# Patient Record
Sex: Female | Born: 1950 | Race: White | Hispanic: No | Marital: Married | Smoking: Never smoker
Health system: Southern US, Community
[De-identification: ages and names within clinical notes are randomized; demographics above are authoritative.]

## PROBLEM LIST (undated history)

## (undated) DIAGNOSIS — I10 Essential (primary) hypertension: Secondary | ICD-10-CM

## (undated) DIAGNOSIS — F988 Other specified behavioral and emotional disorders with onset usually occurring in childhood and adolescence: Secondary | ICD-10-CM

## (undated) DIAGNOSIS — Z9889 Other specified postprocedural states: Secondary | ICD-10-CM

## (undated) DIAGNOSIS — R319 Hematuria, unspecified: Secondary | ICD-10-CM

## (undated) DIAGNOSIS — R112 Nausea with vomiting, unspecified: Secondary | ICD-10-CM

## (undated) DIAGNOSIS — C9110 Chronic lymphocytic leukemia of B-cell type not having achieved remission: Secondary | ICD-10-CM

## (undated) DIAGNOSIS — E785 Hyperlipidemia, unspecified: Secondary | ICD-10-CM

## (undated) HISTORY — PX: LAPAROSCOPY: SHX197

## (undated) HISTORY — DX: Hyperlipidemia, unspecified: E78.5

## (undated) HISTORY — DX: Other specified behavioral and emotional disorders with onset usually occurring in childhood and adolescence: F98.8

## (undated) HISTORY — DX: Chronic lymphocytic leukemia of B-cell type not having achieved remission: C91.10

## (undated) HISTORY — DX: Essential (primary) hypertension: I10

## (undated) HISTORY — DX: Hematuria, unspecified: R31.9

## (undated) HISTORY — PX: TOTAL HIP ARTHROPLASTY: SHX124

---

## 2000-12-09 ENCOUNTER — Other Ambulatory Visit: Admission: RE | Admit: 2000-12-09 | Discharge: 2000-12-09 | Payer: Self-pay | Admitting: Obstetrics and Gynecology

## 2002-01-16 ENCOUNTER — Other Ambulatory Visit: Admission: RE | Admit: 2002-01-16 | Discharge: 2002-01-16 | Payer: Self-pay | Admitting: Obstetrics and Gynecology

## 2002-01-23 ENCOUNTER — Encounter: Payer: Self-pay | Admitting: Obstetrics and Gynecology

## 2002-01-23 ENCOUNTER — Encounter: Admission: RE | Admit: 2002-01-23 | Discharge: 2002-01-23 | Payer: Self-pay | Admitting: Obstetrics and Gynecology

## 2002-01-30 ENCOUNTER — Encounter: Admission: RE | Admit: 2002-01-30 | Discharge: 2002-01-30 | Payer: Self-pay | Admitting: Obstetrics and Gynecology

## 2002-01-30 ENCOUNTER — Encounter: Payer: Self-pay | Admitting: Obstetrics and Gynecology

## 2003-01-25 ENCOUNTER — Other Ambulatory Visit: Admission: RE | Admit: 2003-01-25 | Discharge: 2003-01-25 | Payer: Self-pay | Admitting: Obstetrics and Gynecology

## 2003-01-30 ENCOUNTER — Encounter: Payer: Self-pay | Admitting: Obstetrics and Gynecology

## 2003-01-30 ENCOUNTER — Encounter: Admission: RE | Admit: 2003-01-30 | Discharge: 2003-01-30 | Payer: Self-pay | Admitting: Obstetrics and Gynecology

## 2003-10-17 ENCOUNTER — Encounter: Admission: RE | Admit: 2003-10-17 | Discharge: 2003-10-17 | Payer: Self-pay | Admitting: Obstetrics and Gynecology

## 2004-05-14 ENCOUNTER — Other Ambulatory Visit: Admission: RE | Admit: 2004-05-14 | Discharge: 2004-05-14 | Payer: Self-pay | Admitting: Obstetrics and Gynecology

## 2005-09-01 ENCOUNTER — Other Ambulatory Visit: Admission: RE | Admit: 2005-09-01 | Discharge: 2005-09-01 | Payer: Self-pay | Admitting: Obstetrics and Gynecology

## 2006-09-19 ENCOUNTER — Other Ambulatory Visit: Admission: RE | Admit: 2006-09-19 | Discharge: 2006-09-19 | Payer: Self-pay | Admitting: Obstetrics and Gynecology

## 2006-09-21 ENCOUNTER — Encounter: Admission: RE | Admit: 2006-09-21 | Discharge: 2006-09-21 | Payer: Self-pay | Admitting: Obstetrics and Gynecology

## 2006-10-05 ENCOUNTER — Ambulatory Visit: Payer: Self-pay | Admitting: Internal Medicine

## 2006-11-03 ENCOUNTER — Encounter (INDEPENDENT_AMBULATORY_CARE_PROVIDER_SITE_OTHER): Payer: Self-pay | Admitting: Specialist

## 2006-11-03 ENCOUNTER — Ambulatory Visit: Payer: Self-pay | Admitting: Internal Medicine

## 2008-12-03 ENCOUNTER — Other Ambulatory Visit: Admission: RE | Admit: 2008-12-03 | Discharge: 2008-12-03 | Payer: Self-pay | Admitting: Obstetrics and Gynecology

## 2009-12-30 ENCOUNTER — Encounter: Admission: RE | Admit: 2009-12-30 | Discharge: 2009-12-30 | Payer: Self-pay | Admitting: Obstetrics and Gynecology

## 2010-05-27 ENCOUNTER — Ambulatory Visit: Payer: Self-pay | Admitting: Cardiology

## 2011-06-02 ENCOUNTER — Other Ambulatory Visit: Payer: Self-pay | Admitting: *Deleted

## 2011-06-02 NOTE — Telephone Encounter (Signed)
escribe medication per fax request  

## 2011-08-16 ENCOUNTER — Other Ambulatory Visit: Payer: Self-pay | Admitting: Cardiology

## 2011-08-16 MED ORDER — TELMISARTAN-HCTZ 80-25 MG PO TABS: 1.0000 | ORAL_TABLET | Freq: Every day | ORAL | Status: DC

## 2011-08-16 NOTE — Telephone Encounter (Signed)
Pt called wanting refill of mycardis hct  Pt was previous dr Deborah Chalk pt and made appt to see dr [place] on Dec 5 She is going out of town tomorrow and only has 6 pills left. Please call to [facility] on [address]

## 2011-08-16 NOTE — Telephone Encounter (Signed)
Called requesting med refill on Micardis HCT 80/25. Sent to [facility].

## 2011-09-08 ENCOUNTER — Encounter: Payer: Self-pay | Admitting: Cardiology

## 2011-09-08 ENCOUNTER — Ambulatory Visit (INDEPENDENT_AMBULATORY_CARE_PROVIDER_SITE_OTHER): Payer: 59 | Admitting: Cardiology

## 2011-09-08 VITALS — BP 120/78 | HR 84 | Ht 63.0 in | Wt 155.8 lb

## 2011-09-08 DIAGNOSIS — E559 Vitamin D deficiency, unspecified: Secondary | ICD-10-CM

## 2011-09-08 DIAGNOSIS — I10 Essential (primary) hypertension: Secondary | ICD-10-CM

## 2011-09-08 DIAGNOSIS — E785 Hyperlipidemia, unspecified: Secondary | ICD-10-CM

## 2011-09-08 MED ORDER — AMLODIPINE BESYLATE 5 MG PO TABS: 5.0000 mg | ORAL_TABLET | Freq: Every day | ORAL | Status: DC

## 2011-09-08 MED ORDER — TELMISARTAN-HCTZ 80-25 MG PO TABS: 1.0000 | ORAL_TABLET | Freq: Every day | ORAL | Status: DC

## 2011-09-08 MED ORDER — ATORVASTATIN CALCIUM 20 MG PO TABS: 10.0000 mg | ORAL_TABLET | Freq: Every day | ORAL | Status: DC

## 2011-09-08 NOTE — Assessment & Plan Note (Signed)
She is on chronic therapy with Lipitor 10 mg daily. We will schedule her back for fasting lipid levels. I will plan a followup again in one year.

## 2011-09-08 NOTE — Progress Notes (Signed)
   Alice Pollard Date of Birth: Jan 25, 1951 Medical Record #[id number]  History of Present Illness: Alice Pollard is seen today to followup on her hypertension and dyslipidemia. She has been followed by Dr. Reyes Ivan and Dr. Deborah Chalk in the past for these issues. She has no history of cardiac disease. Her health status is good. She denies any chest pain, shortness of breath, or palpitations. She is tolerating her medications well. She reports that she stays active playing golf and tennis. Her husband is an Pensions consultant.  Current Outpatient Prescriptions on File Prior to Visit  Medication Sig Dispense Refill  . amLODipine (NORVASC) 5 MG tablet Take 1 tablet (5 mg total) by mouth daily.  90 tablet  3  . atorvastatin (LIPITOR) 20 MG tablet Take 0.5 tablets (10 mg total) by mouth daily.  45 tablet  3  . CALCIUM PO Take by mouth daily.        . Estradiol (ESTROGEL TD) Place onto the skin 2 (two) times a week.        Marland Kitchen FLUoxetine (PROZAC) 20 MG tablet Take 20 mg by mouth daily.        . progesterone (PROMETRIUM) 100 MG capsule Take 100 mg by mouth daily.        Marland Kitchen DISCONTD: telmisartan-hydrochlorothiazide (MICARDIS HCT) 80-25 MG per tablet Take 1 tablet by mouth daily.  30 tablet  0    Allergies  Allergen Reactions  . Codeine     Past Medical History  Diagnosis Date  . HTN (hypertension)   . Dyslipidemia   . Endometriosis     Past Surgical History  Procedure Date  . Exploratory laparotomy     endometriosis    History  Smoking status  . Never Smoker   Smokeless tobacco  . Not on file    History  Alcohol Use: Not on file    Family History  Problem Relation Age of Onset  . Heart failure Mother     Review of Systems: As noted in history of present illness.  All other systems were reviewed and are negative.  Physical Exam: BP 120/78  Pulse 84  Ht 5\' 3"  (1.6 m)  Wt 70.67 kg (155 lb 12.8 oz)  BMI 27.60 kg/m2 The patient is alert and oriented x 3.  The mood and affect are normal.   The skin is warm and dry.  Color is normal.  The HEENT exam reveals that the sclera are nonicteric.  The mucous membranes are moist.  The carotids are 2+ without bruits.  There is no thyromegaly.  There is no JVD.  The lungs are clear.  The chest wall is non tender.  The heart exam reveals a regular rate with a normal S1 and S2.  There are no murmurs, gallops, or rubs.  The PMI is not displaced.   Abdominal exam reveals good bowel sounds.  There is no guarding or rebound.  There is no hepatosplenomegaly or tenderness.  There are no masses.  Exam of the legs reveal no clubbing, cyanosis, or edema.  The legs are without rashes.  The distal pulses are intact.  Cranial nerves II - XII are intact.  Motor and sensory functions are intact.  The gait is normal.  LABORATORY DATA: ECG today is normal.  Assessment / Plan:

## 2011-09-08 NOTE — Patient Instructions (Signed)
You need to get regular aerobic exercise.  Keep your weight down.  Continue your current medications.  We will schedule you for fasting blood work.  I will see you again in 1 year.

## 2011-09-08 NOTE — Assessment & Plan Note (Signed)
Blood pressure is in excellent control now. We will continue on her current medical therapy. We will schedule her for followup lab work to check her electrolytes and renal function. She reports a history of low vitamin D levels and we will check this as well.

## 2011-09-15 ENCOUNTER — Other Ambulatory Visit: Payer: 59 | Admitting: *Deleted

## 2011-10-12 ENCOUNTER — Other Ambulatory Visit: Payer: 59 | Admitting: *Deleted

## 2011-11-16 ENCOUNTER — Encounter: Payer: Self-pay | Admitting: Internal Medicine

## 2012-07-10 ENCOUNTER — Encounter: Payer: Self-pay | Admitting: Internal Medicine

## 2012-08-04 ENCOUNTER — Other Ambulatory Visit: Payer: Self-pay | Admitting: Cardiology

## 2012-08-04 MED ORDER — ATORVASTATIN CALCIUM 20 MG PO TABS: 10.0000 mg | ORAL_TABLET | Freq: Every day | ORAL | Status: DC

## 2012-08-04 MED ORDER — AMLODIPINE BESYLATE 5 MG PO TABS: 5.0000 mg | ORAL_TABLET | Freq: Every day | ORAL | Status: DC

## 2012-08-04 MED ORDER — TELMISARTAN-HCTZ 80-25 MG PO TABS: 1.0000 | ORAL_TABLET | Freq: Every day | ORAL | Status: DC

## 2012-08-10 ENCOUNTER — Encounter: Payer: Self-pay | Admitting: *Deleted

## 2012-10-17 ENCOUNTER — Encounter: Payer: Self-pay | Admitting: Cardiology

## 2012-10-17 ENCOUNTER — Ambulatory Visit (INDEPENDENT_AMBULATORY_CARE_PROVIDER_SITE_OTHER): Payer: 59 | Admitting: Cardiology

## 2012-10-17 VITALS — BP 126/84 | HR 82 | Ht 63.0 in | Wt 158.4 lb

## 2012-10-17 DIAGNOSIS — E785 Hyperlipidemia, unspecified: Secondary | ICD-10-CM

## 2012-10-17 DIAGNOSIS — I10 Essential (primary) hypertension: Secondary | ICD-10-CM

## 2012-10-17 MED ORDER — TELMISARTAN-HCTZ 80-25 MG PO TABS: 1.0000 | ORAL_TABLET | Freq: Every day | ORAL | Status: DC

## 2012-10-17 MED ORDER — AMLODIPINE BESYLATE 5 MG PO TABS: 5.0000 mg | ORAL_TABLET | Freq: Every day | ORAL | Status: DC

## 2012-10-17 NOTE — Patient Instructions (Addendum)
Resume lipitor 10 mg daily  Continue your other medications  I will schedule you for lab work in 3 months.  I will see you back in 1 year.

## 2012-10-17 NOTE — Progress Notes (Signed)
   Rudene Re Date of Birth: 16-Jul-1951 Medical Record #[id number]  History of Present Illness: Alice Pollard is seen today to followup on her hypertension and dyslipidemia.  She has no history of cardiac disease. Her health status is good. She denies any chest pain, shortness of breath, or palpitations. She admits that she has not been taking her lipitor regularly.  Current Outpatient Prescriptions on File Prior to Visit  Medication Sig Dispense Refill  . amLODipine (NORVASC) 5 MG tablet Take 1 tablet (5 mg total) by mouth daily.  90 tablet  3  . atorvastatin (LIPITOR) 20 MG tablet Take 0.5 tablets (10 mg total) by mouth daily.  45 tablet  1  . CALCIUM PO Take by mouth daily.        . Cetirizine HCl (ZYRTEC PO) Take by mouth.      . Estradiol (ESTROGEL TD) Place onto the skin 2 (two) times a week.        Marland Kitchen FLUoxetine (PROZAC) 20 MG tablet Take 20 mg by mouth daily.        . progesterone (PROMETRIUM) 100 MG capsule Take 100 mg by mouth daily.        Marland Kitchen telmisartan-hydrochlorothiazide (MICARDIS HCT) 80-25 MG per tablet Take 1 tablet by mouth daily.  90 tablet  3    Allergies  Allergen Reactions  . Codeine     Past Medical History  Diagnosis Date  . HTN (hypertension)   . Dyslipidemia   . Endometriosis     Past Surgical History  Procedure Date  . Exploratory laparotomy     endometriosis    History  Smoking status  . Never Smoker   Smokeless tobacco  . Not on file    History  Alcohol Use: Not on file    Family History  Problem Relation Age of Onset  . Heart failure Mother     Review of Systems: As noted in history of present illness.  All other systems were reviewed and are negative.  Physical Exam: BP 126/84  Pulse 82  Ht 5\' 3"  (1.6 m)  Wt 158 lb 6.4 oz (71.85 kg)  BMI 28.06 kg/m2  SpO2 99% The patient is alert and oriented x 3.  The mood and affect are normal.  The skin is warm and dry. The HEENT exam is normal.The carotids are 2+ without bruits.  There  is no thyromegaly.  There is no JVD.  The lungs are clear.    The heart exam reveals a regular rate with a normal S1 and S2.  There are no murmurs, gallops, or rubs.  The PMI is not displaced.   Abdominal exam reveals good bowel sounds.   There is no hepatosplenomegaly or tenderness.  There are no masses.  Exam of the legs reveal no clubbing, cyanosis, or edema.    The distal pulses are intact.  Cranial nerves II - XII are intact.  Motor and sensory functions are intact.  The gait is normal.  LABORATORY DATA:   Assessment / Plan: 1. HTN- BP is well controlled on Micardis HCT and amlodipine. These are refilled today. Follow up in one year.  2. Dyslipidemia resume lipitor 10 mg daily. Will get fasting lab work (chemistries and lipid panel) in 3 months.

## 2013-01-15 ENCOUNTER — Other Ambulatory Visit: Payer: Self-pay

## 2013-01-15 ENCOUNTER — Other Ambulatory Visit (INDEPENDENT_AMBULATORY_CARE_PROVIDER_SITE_OTHER): Payer: 59

## 2013-01-15 DIAGNOSIS — I10 Essential (primary) hypertension: Secondary | ICD-10-CM

## 2013-01-15 DIAGNOSIS — E785 Hyperlipidemia, unspecified: Secondary | ICD-10-CM

## 2013-01-15 LAB — HEPATIC FUNCTION PANEL
ALT: 16 U/L (ref 0–35)
AST: 20 U/L (ref 0–37)
Albumin: 4.4 g/dL (ref 3.5–5.2)
Alkaline Phosphatase: 67 U/L (ref 39–117)
Bilirubin, Direct: 0.1 mg/dL (ref 0.0–0.3)
Total Bilirubin: 0.6 mg/dL (ref 0.3–1.2)
Total Protein: 7.2 g/dL (ref 6.0–8.3)

## 2013-01-15 LAB — BASIC METABOLIC PANEL
BUN: 15 mg/dL (ref 6–23)
CO2: 31 mEq/L (ref 19–32)
Calcium: 9.1 mg/dL (ref 8.4–10.5)
Chloride: 100 mEq/L (ref 96–112)
Creatinine, Ser: 0.7 mg/dL (ref 0.4–1.2)
GFR: 90.19 mL/min (ref >60.00)
Glucose, Bld: 91 mg/dL (ref 70–99)
Potassium: 3.3 mEq/L — ABNORMAL LOW (ref 3.5–5.1)
Sodium: 139 mEq/L (ref 135–145)

## 2013-01-15 LAB — LDL CHOLESTEROL, DIRECT: Direct LDL: 121.6 mg/dL

## 2013-01-15 LAB — LIPID PANEL
Cholesterol: 214 mg/dL — ABNORMAL HIGH (ref 0–200)
HDL: 68.1 mg/dL (ref >39.00)
Total CHOL/HDL Ratio: 3
Triglycerides: 129 mg/dL (ref 0.0–149.0)
VLDL: 25.8 mg/dL (ref 0.0–40.0)

## 2013-01-15 MED ORDER — POTASSIUM CHLORIDE CRYS ER 20 MEQ PO TBCR: 20.0000 meq | EXTENDED_RELEASE_TABLET | Freq: Every day | ORAL | Status: DC

## 2013-01-17 ENCOUNTER — Other Ambulatory Visit: Payer: Self-pay

## 2013-01-17 DIAGNOSIS — E785 Hyperlipidemia, unspecified: Secondary | ICD-10-CM

## 2013-01-17 MED ORDER — ATORVASTATIN CALCIUM 20 MG PO TABS: 20.0000 mg | ORAL_TABLET | Freq: Every day | ORAL | Status: DC

## 2013-01-29 ENCOUNTER — Other Ambulatory Visit (INDEPENDENT_AMBULATORY_CARE_PROVIDER_SITE_OTHER): Payer: 59

## 2013-01-29 DIAGNOSIS — I10 Essential (primary) hypertension: Secondary | ICD-10-CM

## 2013-01-29 LAB — BASIC METABOLIC PANEL
BUN: 18 mg/dL (ref 6–23)
CO2: 31 mEq/L (ref 19–32)
Calcium: 9.3 mg/dL (ref 8.4–10.5)
Chloride: 100 mEq/L (ref 96–112)
Creatinine, Ser: 0.7 mg/dL (ref 0.4–1.2)
GFR: 88.71 mL/min (ref >60.00)
Glucose, Bld: 99 mg/dL (ref 70–99)
Potassium: 3.6 mEq/L (ref 3.5–5.1)
Sodium: 138 mEq/L (ref 135–145)

## 2013-01-31 ENCOUNTER — Telehealth: Payer: Self-pay | Admitting: *Deleted

## 2013-01-31 NOTE — Progress Notes (Signed)
Quick Note:  Please report to patient. The recent labs are stable. Continue same medication and careful diet. ______ 

## 2013-01-31 NOTE — Telephone Encounter (Signed)
Message copied by Burnell Blanks on Wed Jan 31, 2013  2:22 PM ------      Message from: Cassell Clement      Created: Wed Jan 31, 2013 11:15 AM       Please report to patient.  The recent labs are stable. Continue same medication and careful diet. ------

## 2013-01-31 NOTE — Telephone Encounter (Signed)
Advised patient of lab results  

## 2013-03-02 ENCOUNTER — Other Ambulatory Visit: Payer: Self-pay | Admitting: *Deleted

## 2013-03-02 MED ORDER — PROGESTERONE MICRONIZED 100 MG PO CAPS: 100.0000 mg | ORAL_CAPSULE | Freq: Every day | ORAL | Status: DC

## 2013-03-02 NOTE — Telephone Encounter (Signed)
Yes

## 2013-03-02 NOTE — Telephone Encounter (Signed)
Has annual scheduled for 03/26/13  Ok to give enough to last until then?

## 2013-03-19 ENCOUNTER — Other Ambulatory Visit: Payer: Self-pay

## 2013-03-19 ENCOUNTER — Other Ambulatory Visit: Payer: 59

## 2013-03-19 DIAGNOSIS — Z1231 Encounter for screening mammogram for malignant neoplasm of breast: Secondary | ICD-10-CM

## 2013-03-23 ENCOUNTER — Encounter: Payer: Self-pay | Admitting: Obstetrics & Gynecology

## 2013-03-26 ENCOUNTER — Ambulatory Visit (INDEPENDENT_AMBULATORY_CARE_PROVIDER_SITE_OTHER): Payer: 59 | Admitting: Obstetrics & Gynecology

## 2013-03-26 ENCOUNTER — Encounter: Payer: Self-pay | Admitting: Obstetrics & Gynecology

## 2013-03-26 VITALS — BP 118/76 | HR 60 | Ht 62.5 in | Wt 158.0 lb

## 2013-03-26 DIAGNOSIS — Z0000 Encounter for general adult medical examination without abnormal findings: Secondary | ICD-10-CM

## 2013-03-26 DIAGNOSIS — Z01419 Encounter for gynecological examination (general) (routine) without abnormal findings: Secondary | ICD-10-CM

## 2013-03-26 LAB — POCT URINALYSIS DIPSTICK
Bilirubin, UA: NEGATIVE
Glucose, UA: NEGATIVE
Ketones, UA: NEGATIVE
Leukocytes, UA: NEGATIVE
Nitrite, UA: NEGATIVE
Protein, UA: NEGATIVE
Urobilinogen, UA: NEGATIVE
pH, UA: 7

## 2013-03-26 LAB — BASIC METABOLIC PANEL
BUN: 17 mg/dL (ref 6–23)
CO2: 29 mEq/L (ref 19–32)
Calcium: 10.5 mg/dL (ref 8.4–10.5)
Chloride: 100 mEq/L (ref 96–112)
Creat: 0.66 mg/dL (ref 0.50–1.10)
Glucose, Bld: 99 mg/dL (ref 70–99)
Potassium: 3.7 mEq/L (ref 3.5–5.3)
Sodium: 140 mEq/L (ref 135–145)

## 2013-03-26 MED ORDER — PROGESTERONE MICRONIZED 100 MG PO CAPS: 100.0000 mg | ORAL_CAPSULE | Freq: Every day | ORAL | Status: DC

## 2013-03-26 MED ORDER — ESTRADIOL 0.0375 MG/24HR TD PTTW: 1.0000 | MEDICATED_PATCH | TRANSDERMAL | Status: DC

## 2013-03-26 MED ORDER — FLUOXETINE HCL 20 MG PO TABS: 10.0000 mg | ORAL_TABLET | Freq: Every day | ORAL | Status: DC

## 2013-03-26 NOTE — Progress Notes (Signed)
Patient ID: Alice Pollard, female   DOB: Jul 05, 1951, 62 y.o.   MRN: [id number]  62 y.o. G3P3 MarriedCaucasianF here for annual exam.  No vaginal bleeding.  Family doing well.  Oldest daughter has two daughters.     Patient's last menstrual period was 08/05/2007.          Sexually active: yes  The current method of family planning is post menopausal status.    Exercising: yes  tennis, golf Smoker:  no  Health Maintenance: Pap:  03/02/12--ASCUS with HR HPV History of abnormal Pap:  no MMG:  12/30/09 Colonoscopy:  1/08 polyps, diverticular disease BMD:   never TDaP:  12/03/08 done here Screening Labs: 4/14--potassium was mildly low.  Was on potassium supplementation and now off, Hb today: not done  Urine today: trace blood   reports that she has never smoked. She has never used smokeless tobacco. She reports that  drinks alcohol. She reports that she does not use illicit drugs.  Past Medical History  Diagnosis Date  . HTN (hypertension)   . Dyslipidemia   . Endometriosis   . ADD (attention deficit disorder)   . Hematuria     h/o asymptomatic    Past Surgical History  Procedure Laterality Date  . Laparoscopy      endometriosis    Current Outpatient Prescriptions  Medication Sig Dispense Refill  . amLODipine (NORVASC) 5 MG tablet Take 1 tablet (5 mg total) by mouth daily.  90 tablet  3  . atorvastatin (LIPITOR) 20 MG tablet Take 1 tablet (20 mg total) by mouth daily.  30 tablet  6  . CALCIUM PO Take by mouth daily.        . Cetirizine HCl (ZYRTEC PO) Take by mouth.      . cholecalciferol (VITAMIN D) 1000 UNITS tablet Take 1,000 Units by mouth daily.      Marland Kitchen estradiol (VIVELLE-DOT) 0.05 MG/24HR Place 1 patch onto the skin 2 (two) times a week.      Marland Kitchen FLUoxetine (PROZAC) 20 MG tablet Take 20 mg by mouth daily.        . montelukast (SINGULAIR) 10 MG tablet Take 10 mg by mouth as needed.      . progesterone (PROMETRIUM) 100 MG capsule Take 1 capsule (100 mg total) by mouth daily.  30  capsule  0  . telmisartan-hydrochlorothiazide (MICARDIS HCT) 80-25 MG per tablet Take 1 tablet by mouth daily.  90 tablet  3  . potassium chloride SA (K-DUR,KLOR-CON) 20 MEQ tablet Take 1 tablet (20 mEq total) by mouth daily.  30 tablet  6   No current facility-administered medications for this visit.    Family History  Problem Relation Age of Onset  . Heart failure Mother   . Other Brother     bilateral bladder  . Diabetes Father     ROS:  Pertinent items are noted in HPI.  Otherwise, a comprehensive ROS was negative.  Exam:   BP 118/76  Pulse 60  Ht 5' 2.5" (1.588 m)  Wt 158 lb (71.668 kg)  BMI 28.42 kg/m2  LMP 08/05/2007  Weight change: +3lbs  Height: 5' 2.5" (158.8 cm)  Ht Readings from Last 3 Encounters:  03/26/13 5' 2.5" (1.588 m)  10/17/12 5\' 3"  (1.6 m)  09/08/11 5\' 3"  (1.6 m)    General appearance: alert, cooperative and appears stated age Head: Normocephalic, without obvious abnormality, atraumatic Neck: no adenopathy, supple, symmetrical, trachea midline and thyroid normal to inspection and palpation Lungs: clear to  auscultation bilaterally Breasts: normal appearance, no masses or tenderness Heart: regular rate and rhythm Abdomen: soft, non-tender; bowel sounds normal; no masses,  no organomegaly Extremities: extremities normal, atraumatic, no cyanosis or edema Skin: Skin color, texture, turgor normal. No rashes or lesions Lymph nodes: Cervical, supraclavicular, and axillary nodes normal. No abnormal inguinal nodes palpated Neurologic: Grossly normal   Pelvic: External genitalia:  no lesions              Urethra:  normal appearing urethra with no masses, tenderness or lesions              Bartholins and Skenes: normal                 Vagina: normal appearing vagina with normal color and discharge, no lesions              Cervix: no lesions              Pap taken: no Bimanual Exam:  Uterus:  normal size, contour, position, consistency, mobility, non-tender               Adnexa: normal adnexa and no mass, fullness, tenderness               Rectovaginal: Confirms               Anus:  normal sphincter tone, no lesions  A:  Well Woman with normal exam  P:   Mammogram scheduled July 17.  Will try to schedule BMD for same day Knows colonoscopy is due Decrease prozac to 10mg  qd Decrease Minivelle to 0.0372mb twice weekly.  Continue Prometrium 100mg  qd.  Advised to take qhs pap smear with neg HR HPV 5/13 return annually or prn  An After Visit Summary was printed and given to the patient.

## 2013-03-26 NOTE — Patient Instructions (Signed)

## 2013-03-27 ENCOUNTER — Telehealth: Payer: Self-pay

## 2013-03-27 NOTE — Telephone Encounter (Signed)
6/24 lmtcb//kn 

## 2013-03-28 ENCOUNTER — Other Ambulatory Visit: Payer: Self-pay | Admitting: Obstetrics & Gynecology

## 2013-03-28 DIAGNOSIS — Z780 Asymptomatic menopausal state: Secondary | ICD-10-CM

## 2013-04-05 NOTE — Telephone Encounter (Signed)
7/3 lmtcb//kn

## 2013-04-05 NOTE — Telephone Encounter (Signed)
Patient returning Kelly's call. °

## 2013-04-19 ENCOUNTER — Ambulatory Visit
Admission: RE | Admit: 2013-04-19 | Discharge: 2013-04-19 | Disposition: A | Discharge status: Home / Self Care | Payer: 59 | Source: Ambulatory Visit | Attending: Obstetrics & Gynecology | Admitting: Obstetrics & Gynecology

## 2013-04-19 ENCOUNTER — Ambulatory Visit: Payer: 59

## 2013-04-19 ENCOUNTER — Ambulatory Visit
Admission: RE | Admit: 2013-04-19 | Discharge: 2013-04-19 | Disposition: A | Discharge status: Home / Self Care | Payer: 59 | Source: Ambulatory Visit

## 2013-04-19 DIAGNOSIS — Z1231 Encounter for screening mammogram for malignant neoplasm of breast: Secondary | ICD-10-CM

## 2013-04-19 DIAGNOSIS — Z780 Asymptomatic menopausal state: Secondary | ICD-10-CM

## 2013-04-22 ENCOUNTER — Encounter: Payer: Self-pay | Admitting: Obstetrics & Gynecology

## 2013-04-23 MED ORDER — FLUOXETINE HCL 20 MG PO TABS: 20.0000 mg | ORAL_TABLET | Freq: Every day | ORAL | Status: DC

## 2013-04-23 MED ORDER — ESTRADIOL 0.05 MG/24HR TD PTTW: 1.0000 | MEDICATED_PATCH | TRANSDERMAL | Status: DC

## 2013-04-23 NOTE — Telephone Encounter (Signed)
Inform rx done to pharmacy.

## 2013-04-23 NOTE — Telephone Encounter (Signed)
Patient notified

## 2013-04-23 NOTE — Telephone Encounter (Signed)
Patient notified of all results. States she has sent [place] a message through [place] stating tried to decrease estrogen patch but had more hot flashes and itching and tried to decrease prozac but was having some negative side effects. Would like to go back to her original doses, but will need new rx for both. Please advise.

## 2013-08-09 ENCOUNTER — Other Ambulatory Visit: Payer: Self-pay

## 2013-10-05 ENCOUNTER — Other Ambulatory Visit: Payer: Self-pay | Admitting: *Deleted

## 2013-10-05 MED ORDER — AMLODIPINE BESYLATE 5 MG PO TABS: 5.0000 mg | ORAL_TABLET | Freq: Every day | ORAL | Status: DC

## 2013-10-05 MED ORDER — TELMISARTAN-HCTZ 80-25 MG PO TABS: 1.0000 | ORAL_TABLET | Freq: Every day | ORAL | Status: DC

## 2013-11-28 ENCOUNTER — Encounter: Payer: Self-pay | Admitting: Cardiology

## 2013-11-28 ENCOUNTER — Ambulatory Visit (INDEPENDENT_AMBULATORY_CARE_PROVIDER_SITE_OTHER): Payer: 59 | Admitting: Cardiology

## 2013-11-28 VITALS — BP 132/80 | HR 73 | Ht 62.5 in | Wt 162.0 lb

## 2013-11-28 DIAGNOSIS — I10 Essential (primary) hypertension: Secondary | ICD-10-CM

## 2013-11-28 DIAGNOSIS — E785 Hyperlipidemia, unspecified: Secondary | ICD-10-CM

## 2013-11-28 MED ORDER — TELMISARTAN-HCTZ 80-25 MG PO TABS: 1.0000 | ORAL_TABLET | Freq: Every day | ORAL | Status: DC

## 2013-11-28 MED ORDER — AMLODIPINE BESYLATE 5 MG PO TABS: 5.0000 mg | ORAL_TABLET | Freq: Every day | ORAL | Status: DC

## 2013-11-28 NOTE — Patient Instructions (Signed)
We will check your fasting lab work  Continue your blood pressure medication.

## 2013-11-28 NOTE — Progress Notes (Signed)
Alice Pollard Date of Birth: 07/27/1951 Medical Record #[id number]  History of Present Illness: Alice Pollard is seen today to followup on her hypertension and dyslipidemia.  She has no history of cardiac disease. Her health status is good. She denies any chest pain, shortness of breath, or palpitations. She quit taking lipitor because of some tingling in her fingers and ? Mild muscle weakness. She does exercise and eats a healthy diet. There is a family history of CHF in both parents.  Current Outpatient Prescriptions on File Prior to Visit  Medication Sig Dispense Refill  . CALCIUM PO Take by mouth daily.        . Cetirizine HCl (ZYRTEC PO) Take by mouth.      . cholecalciferol (VITAMIN D) 1000 UNITS tablet Take 1,000 Units by mouth daily.      Marland Kitchen estradiol (MINIVELLE) 0.05 MG/24HR Place 1 patch (0.05 mg total) onto the skin 2 (two) times a week.  24 patch  4  . FLUoxetine (PROZAC) 20 MG tablet Take 1 tablet (20 mg total) by mouth daily.  90 tablet  4  . montelukast (SINGULAIR) 10 MG tablet Take 10 mg by mouth as needed.      . progesterone (PROMETRIUM) 100 MG capsule Take 1 capsule (100 mg total) by mouth daily.  90 capsule  4   No current facility-administered medications on file prior to visit.    Allergies  Allergen Reactions  . Codeine   . Latex     sensitivity    Past Medical History  Diagnosis Date  . HTN (hypertension)   . Dyslipidemia   . Endometriosis   . ADD (attention deficit disorder)   . Hematuria     h/o asymptomatic    Past Surgical History  Procedure Laterality Date  . Laparoscopy      endometriosis    History  Smoking status  . Never Smoker   Smokeless tobacco  . Never Used    History  Alcohol Use  . Yes    Comment: occ    Family History  Problem Relation Age of Onset  . Heart failure Mother   . Other Brother     bilateral bladder  . Diabetes Father     Review of Systems: As noted in history of present illness.  All other systems  were reviewed and are negative.  Physical Exam: BP 132/80  Pulse 73  Ht 5' 2.5" (1.588 m)  Wt 162 lb (73.483 kg)  BMI 29.14 kg/m2 She is a WDWF in NAD. The HEENT exam is normal.The carotids are 2+ without bruits.  There is no thyromegaly.  There is no JVD.  The lungs are clear.    The heart exam reveals a regular rate with a normal S1 and S2.  There are no murmurs, gallops, or rubs.  The PMI is not displaced.   Abdominal exam reveals good bowel sounds.   There is no hepatosplenomegaly or tenderness.  There are no masses.  Exam of the legs reveal no clubbing, cyanosis, or edema.    The distal pulses are intact.  Cranial nerves II - XII are intact.  Motor and sensory functions are intact.  The gait is normal.  LABORATORY DATA: Ecg: NSR rate 73 bpm. Normal.  Assessment / Plan: 1. HTN- BP is well controlled on Micardis HCT and amlodipine. These are refilled today. Follow up in one year.  2. Dyslipidemia - based on prior levels 10 year CV risk is about 5.5%. Will check  fasting lab work and Vit D level. May want to try moderate dose statin depending on results.

## 2013-12-11 ENCOUNTER — Other Ambulatory Visit: Payer: 59

## 2014-04-23 ENCOUNTER — Other Ambulatory Visit: Payer: Self-pay | Admitting: Obstetrics & Gynecology

## 2014-04-24 NOTE — Telephone Encounter (Signed)
Last AEX; 03/26/13 Last refill: Prozac 04/23/13 #90, 4 ref   Progesterone 03/26/13 #90, 4 ref Last MMG: 04/23/13 Current AEX: 05/21/14  Please advise

## 2014-05-21 ENCOUNTER — Ambulatory Visit: Payer: 59 | Admitting: Obstetrics & Gynecology

## 2014-05-23 ENCOUNTER — Ambulatory Visit: Payer: 59 | Admitting: Obstetrics & Gynecology

## 2014-05-31 ENCOUNTER — Encounter: Payer: Self-pay | Admitting: Obstetrics & Gynecology

## 2014-05-31 ENCOUNTER — Ambulatory Visit (INDEPENDENT_AMBULATORY_CARE_PROVIDER_SITE_OTHER): Payer: 59 | Admitting: Obstetrics & Gynecology

## 2014-05-31 VITALS — BP 124/64 | HR 60 | Resp 16 | Ht 62.5 in | Wt 157.8 lb

## 2014-05-31 DIAGNOSIS — Z0000 Encounter for general adult medical examination without abnormal findings: Secondary | ICD-10-CM

## 2014-05-31 DIAGNOSIS — Z1211 Encounter for screening for malignant neoplasm of colon: Secondary | ICD-10-CM

## 2014-05-31 DIAGNOSIS — Z01419 Encounter for gynecological examination (general) (routine) without abnormal findings: Secondary | ICD-10-CM

## 2014-05-31 DIAGNOSIS — Z124 Encounter for screening for malignant neoplasm of cervix: Secondary | ICD-10-CM

## 2014-05-31 LAB — HEMOGLOBIN, FINGERSTICK: Hemoglobin, fingerstick: 14.8 g/dL (ref 12.0–16.0)

## 2014-05-31 MED ORDER — PROGESTERONE MICRONIZED 100 MG PO CAPS: 100.0000 mg | ORAL_CAPSULE | Freq: Every day | ORAL | Status: DC

## 2014-05-31 MED ORDER — ESTRADIOL 0.05 MG/24HR TD PTTW: 1.0000 | MEDICATED_PATCH | TRANSDERMAL | Status: DC

## 2014-05-31 MED ORDER — FLUOXETINE HCL 20 MG PO TABS: ORAL_TABLET | ORAL | Status: DC

## 2014-05-31 NOTE — Progress Notes (Signed)
63 y.o. G3P3 MarriedCaucasianF here for annual exam.  Just had third grandchild.  Daughter has sensitization to Antigen E.  Granddaughter was in NICU for about a week and a half.  Pt just got back from [place].    Patient's last menstrual period was 08/05/2007.          Sexually active: Yes.    The current method of family planning is vasectomy and post menopausal status.    Exercising: Yes.    golf and tennis Smoker:  no  Health Maintenance: Pap:  03/02/12 ASCUS/negative HR HPV History of abnormal Pap:  no MMG:  04/19/13 3D-normal.  Aware due. Colonoscopy:  1/08-repeat in 5 years BMD:   04/19/13-normal TDaP:  3/10  Screening Labs: 2014, Hb today: 14.8, Urine today: WBC-trace, PROTEIN-trace, RBC-2+   reports that she has never smoked. She has never used smokeless tobacco. She reports that she drinks about 1.5 - 2 ounces of alcohol per week. She reports that she does not use illicit drugs.  Past Medical History  Diagnosis Date  . HTN (hypertension)   . Dyslipidemia   . Endometriosis   . ADD (attention deficit disorder)   . Hematuria     h/o asymptomatic    Past Surgical History  Procedure Laterality Date  . Laparoscopy      endometriosis    Current Outpatient Prescriptions  Medication Sig Dispense Refill  . amLODipine (NORVASC) 5 MG tablet Take 1 tablet (5 mg total) by mouth daily.  90 tablet  3  . CALCIUM PO Take by mouth daily.        . Cetirizine HCl (ZYRTEC PO) Take by mouth.      . cholecalciferol (VITAMIN D) 1000 UNITS tablet Take 1,000 Units by mouth daily.      Marland Kitchen estradiol (MINIVELLE) 0.05 MG/24HR Place 1 patch (0.05 mg total) onto the skin 2 (two) times a week.  24 patch  4  . FLUoxetine (PROZAC) 20 MG tablet TAKE 1 TABLET BY MOUTH EVERY DAY  90 tablet  0  . montelukast (SINGULAIR) 10 MG tablet Take 10 mg by mouth as needed.      . progesterone (PROMETRIUM) 100 MG capsule TAKE 1 CAPSULE BY MOUTH DAILY  90 capsule  0  . telmisartan-hydrochlorothiazide (MICARDIS  HCT) 80-25 MG per tablet Take 1 tablet by mouth daily.  90 tablet  3   No current facility-administered medications for this visit.    Family History  Problem Relation Age of Onset  . Heart failure Mother   . Other Brother     bilateral bladder  . Diabetes Father     ROS:  Pertinent items are noted in HPI.  Otherwise, a comprehensive ROS was negative.  Exam:   BP 124/64  Pulse 60  Resp 16  Ht 5' 2.5" (1.588 m)  Wt 157 lb 12.8 oz (71.578 kg)  BMI 28.38 kg/m2  LMP 08/05/2007   Height: 5' 2.5" (158.8 cm)  Ht Readings from Last 3 Encounters:  05/31/14 5' 2.5" (1.588 m)  11/28/13 5' 2.5" (1.588 m)  03/26/13 5' 2.5" (1.588 m)    General appearance: alert, cooperative and appears stated age Head: Normocephalic, without obvious abnormality, atraumatic Neck: no adenopathy, supple, symmetrical, trachea midline and thyroid normal to inspection and palpation Lungs: clear to auscultation bilaterally Breasts: normal appearance, no masses or tenderness Heart: regular rate and rhythm Abdomen: soft, non-tender; bowel sounds normal; no masses,  no organomegaly Extremities: extremities normal, atraumatic, no cyanosis or edema Skin: Skin color,  texture, turgor normal. No rashes or lesions Lymph nodes: Cervical, supraclavicular, and axillary nodes normal. No abnormal inguinal nodes palpated Neurologic: Grossly normal   Pelvic: External genitalia:  no lesions              Urethra:  normal appearing urethra with no masses, tenderness or lesions              Bartholins and Skenes: normal                 Vagina: normal appearing vagina with normal color and discharge, no lesions              Cervix: no lesions              Pap taken: Yes.   Bimanual Exam:  Uterus:  normal size, contour, position, consistency, mobility, non-tender              Adnexa: normal adnexa and no mass, fullness, tenderness               Rectovaginal: Confirms               Anus:  normal sphincter tone, no  lesions  A:  Well Woman with normal exam  PMP, on HRT H/O microscopic hematuria Hypertension  P: Mammogram due.  Pt knows and will schedule.  Did 3D last year.  Knows colonoscopy is due.  Pt did not go last year.  Referral will be made to Dr. Olevia Perches.  She did her last one. Prozac 20mg  daily.  #30/13 Minivelle to 0.05mg  twice weekly. Continue Prometrium 100mg  qd. Advised to take qhs  pap smear with neg HR HPV 5/13.  Pap today. return annually or prn  An After Visit Summary was printed and given to the patient.

## 2014-06-05 LAB — IPS PAP TEST WITH HPV

## 2014-08-05 ENCOUNTER — Encounter: Payer: Self-pay | Admitting: Obstetrics & Gynecology

## 2014-08-31 ENCOUNTER — Other Ambulatory Visit: Payer: Self-pay | Admitting: Obstetrics & Gynecology

## 2014-09-03 ENCOUNTER — Telehealth: Payer: Self-pay | Admitting: Obstetrics & Gynecology

## 2014-09-03 NOTE — Telephone Encounter (Signed)
Pt calling to check on rx for her vivelle patch that she said the pharmacy has contacted office about.

## 2014-09-04 NOTE — Telephone Encounter (Signed)
Rx sent 05/31/14 #4patch/ 13 Refills  Called [facility] spoke with Raynald Kemp they do have Rx on file. - Patient notified.

## 2014-09-20 ENCOUNTER — Other Ambulatory Visit: Payer: Self-pay

## 2014-09-20 ENCOUNTER — Telehealth: Payer: Self-pay | Admitting: Cardiology

## 2014-09-20 ENCOUNTER — Other Ambulatory Visit: Payer: Self-pay | Admitting: *Deleted

## 2014-09-20 DIAGNOSIS — E785 Hyperlipidemia, unspecified: Secondary | ICD-10-CM

## 2014-09-20 DIAGNOSIS — I10 Essential (primary) hypertension: Secondary | ICD-10-CM

## 2014-09-20 LAB — LIPID PANEL
Cholesterol: 234 mg/dL — ABNORMAL HIGH (ref 0–200)
HDL: 48 mg/dL (ref >39)
LDL Cholesterol: 143 mg/dL — ABNORMAL HIGH (ref 0–99)
Total CHOL/HDL Ratio: 4.9 Ratio
Triglycerides: 214 mg/dL — ABNORMAL HIGH (ref <150)
VLDL: 43 mg/dL — ABNORMAL HIGH (ref 0–40)

## 2014-09-20 LAB — HEPATIC FUNCTION PANEL
ALT: 15 U/L (ref 0–35)
AST: 18 U/L (ref 0–37)
Albumin: 4.3 g/dL (ref 3.5–5.2)
Alkaline Phosphatase: 73 U/L (ref 39–117)
Bilirubin, Direct: 0.1 mg/dL (ref 0.0–0.3)
Indirect Bilirubin: 0.4 mg/dL (ref 0.2–1.2)
Total Bilirubin: 0.5 mg/dL (ref 0.2–1.2)
Total Protein: 6.8 g/dL (ref 6.0–8.3)

## 2014-09-20 LAB — BASIC METABOLIC PANEL
BUN: 16 mg/dL (ref 6–23)
CO2: 28 mEq/L (ref 19–32)
Calcium: 9.7 mg/dL (ref 8.4–10.5)
Chloride: 101 mEq/L (ref 96–112)
Creat: 0.79 mg/dL (ref 0.50–1.10)
Glucose, Bld: 93 mg/dL (ref 70–99)
Potassium: 3.8 mEq/L (ref 3.5–5.3)
Sodium: 140 mEq/L (ref 135–145)

## 2014-09-20 NOTE — Telephone Encounter (Signed)
Katrina called to request for labs from February to be released so that she can draw blood. Labs orders released.

## 2014-09-21 LAB — VITAMIN D 25 HYDROXY (VIT D DEFICIENCY, FRACTURES): Vit D, 25-Hydroxy: 36 ng/mL (ref 30–100)

## 2014-09-23 ENCOUNTER — Other Ambulatory Visit: Payer: Self-pay | Admitting: *Deleted

## 2014-09-23 MED ORDER — ROSUVASTATIN CALCIUM 5 MG PO TABS: 5.0000 mg | ORAL_TABLET | Freq: Every day | ORAL | Status: DC

## 2014-11-06 ENCOUNTER — Telehealth: Payer: Self-pay | Admitting: Obstetrics & Gynecology

## 2014-11-06 NOTE — Telephone Encounter (Signed)
Left patient a message at both numbers to call back to reschedule her AEX from 07/01/15 with Dr. Sabra Heck.

## 2014-11-27 ENCOUNTER — Telehealth: Payer: Self-pay | Admitting: Cardiovascular Disease

## 2015-01-01 ENCOUNTER — Other Ambulatory Visit: Payer: Self-pay | Admitting: Cardiology

## 2015-01-02 ENCOUNTER — Telehealth: Payer: Self-pay | Admitting: Cardiology

## 2015-01-02 NOTE — Telephone Encounter (Signed)
°  1. Which medications need to be refilled? Amlodipine and Telmisartan-need this called in today-going out of town early tomorrow morning  2. Which pharmacy is medication to be sent to?[phone]  3. Do they need a 30 day or 90 day supply? 30 and refill until appt-02-03-15 4. Would they like a call back once the medication has been sent to the pharmacy? yes

## 2015-01-03 ENCOUNTER — Other Ambulatory Visit: Payer: Self-pay

## 2015-01-03 MED ORDER — TELMISARTAN-HCTZ 80-25 MG PO TABS: 1.0000 | ORAL_TABLET | Freq: Every day | ORAL | Status: DC

## 2015-01-03 MED ORDER — AMLODIPINE BESYLATE 5 MG PO TABS: 5.0000 mg | ORAL_TABLET | Freq: Every day | ORAL | Status: DC

## 2015-02-03 ENCOUNTER — Ambulatory Visit (INDEPENDENT_AMBULATORY_CARE_PROVIDER_SITE_OTHER): Payer: 59 | Admitting: Cardiology

## 2015-02-03 ENCOUNTER — Encounter: Payer: Self-pay | Admitting: Cardiology

## 2015-02-03 ENCOUNTER — Other Ambulatory Visit: Payer: Self-pay

## 2015-02-03 VITALS — BP 120/90 | HR 85 | Ht 63.0 in | Wt 161.2 lb

## 2015-02-03 DIAGNOSIS — I10 Essential (primary) hypertension: Secondary | ICD-10-CM | POA: Diagnosis not present

## 2015-02-03 DIAGNOSIS — E785 Hyperlipidemia, unspecified: Secondary | ICD-10-CM

## 2015-02-03 MED ORDER — AMLODIPINE BESYLATE 5 MG PO TABS: 5.0000 mg | ORAL_TABLET | Freq: Every day | ORAL | Status: DC

## 2015-02-03 MED ORDER — ROSUVASTATIN CALCIUM 5 MG PO TABS: 5.0000 mg | ORAL_TABLET | Freq: Every day | ORAL | Status: DC

## 2015-02-03 NOTE — Patient Instructions (Signed)
Try taking Crestor 5 mg daily  We will check fasting lab work in 3 months.  I will see you in one year

## 2015-02-03 NOTE — Progress Notes (Signed)
[name] Date of Birth: 05-01-1951 Medical Record #[id number]  History of Present Illness: Alice Pollard is seen today to followup on her hypertension and dyslipidemia.  She has no history of cardiac disease. Her health status is good. She denies any chest pain, shortness of breath, or palpitations. She quit taking lipitor because of some tingling in her fingers and mild muscle weakness. She does exercise and eats a healthy diet. There is a family history of CHF in both parents. We had recommended trying low dose Crestor last year but she never started it.   Current Outpatient Prescriptions on File Prior to Visit  Medication Sig Dispense Refill  . amLODipine (NORVASC) 5 MG tablet Take 1 tablet (5 mg total) by mouth daily. 30 tablet 0  . CALCIUM PO Take by mouth daily.      . Cetirizine HCl (ZYRTEC PO) Take by mouth.    . estradiol (MINIVELLE) 0.05 MG/24HR patch Place 1 patch (0.05 mg total) onto the skin 2 (two) times a week. 4 patch 13  . FLUoxetine (PROZAC) 20 MG tablet TAKE 1 TABLET BY MOUTH EVERY DAY 30 tablet 13  . montelukast (SINGULAIR) 10 MG tablet Take 10 mg by mouth as needed.    . progesterone (PROMETRIUM) 100 MG capsule Take 1 capsule (100 mg total) by mouth daily. 30 capsule 13  . telmisartan-hydrochlorothiazide (MICARDIS HCT) 80-25 MG per tablet Take 1 tablet by mouth daily. 30 tablet 0  . rosuvastatin (CRESTOR) 5 MG tablet Take 1 tablet (5 mg total) by mouth daily. (Patient not taking: Reported on 02/03/2015) 30 tablet 12   No current facility-administered medications on file prior to visit.    Allergies  Allergen Reactions  . Codeine     Fainted   . Latex     sensitivity    Past Medical History  Diagnosis Date  . HTN (hypertension)   . Dyslipidemia   . Endometriosis   . ADD (attention deficit disorder)   . Hematuria     h/o asymptomatic    Past Surgical History  Procedure Laterality Date  . Laparoscopy      endometriosis    History  Smoking status   . Never Smoker   Smokeless tobacco  . Never Used    History  Alcohol Use  . 1.5 - 2.0 oz/week  . 3-4 drink(s) per week    Family History  Problem Relation Age of Onset  . Heart failure Mother   . Other Brother     bilateral bladder  . Diabetes Father     Review of Systems: As noted in history of present illness.  All other systems were reviewed and are negative.  Physical Exam: BP 120/90 mmHg  Pulse 85  Ht 5\' 3"  (1.6 m)  Wt 161 lb 3.2 oz (73.12 kg)  BMI 28.56 kg/m2 She is a WDWF in NAD. The HEENT exam is normal.The carotids are 2+ without bruits.  There is no thyromegaly.  There is no JVD.  The lungs are clear.    The heart exam reveals a regular rate with a normal S1 and S2.  There are no murmurs, gallops, or rubs.  The PMI is not displaced.   Abdominal exam reveals good bowel sounds.   There is no hepatosplenomegaly or tenderness.  There are no masses.  Exam of the legs reveal no clubbing, cyanosis, or edema.    The distal pulses are intact.  Cranial nerves II - XII are intact.  Motor and sensory functions  are intact.  The gait is normal.  LABORATORY DATA: Ecg: NSR rate 85 bpm. One PVC. Otherwise normal. I have personally reviewed and interpreted this study.   Assessment / Plan: 1. HTN- BP is well controlled on Micardis HCT and amlodipine. These are refilled today. Follow up in one year.  2. Dyslipidemia - based on prior levels 10 year CV risk is about 5.5%. Will start Crestor 5 mg daily and recheck labs in 3 months.    I will follow up in one year.

## 2015-02-08 ENCOUNTER — Other Ambulatory Visit: Payer: Self-pay | Admitting: Cardiology

## 2015-06-06 ENCOUNTER — Other Ambulatory Visit: Payer: Self-pay | Admitting: Obstetrics & Gynecology

## 2015-06-06 NOTE — Telephone Encounter (Signed)
Patient leaving for [place] today and needs a refill on her [place] patch as soon as possible.

## 2015-06-06 NOTE — Telephone Encounter (Signed)
LM on patient's vm that rx has been sent to pharmacy. 

## 2015-06-06 NOTE — Telephone Encounter (Signed)
Medication refill request: Minivelle  Last AEX:  05/31/14 SM Next AEX: 07/09/15 SM Last MMG (if hormonal medication request): 04/23/13 BIRADS1:neg Refill authorized: 05/31/14 #4patch / 13R. Today please advise.

## 2015-07-01 ENCOUNTER — Ambulatory Visit: Payer: 59 | Admitting: Obstetrics & Gynecology

## 2015-07-04 ENCOUNTER — Other Ambulatory Visit: Payer: Self-pay | Admitting: Obstetrics & Gynecology

## 2015-07-04 ENCOUNTER — Other Ambulatory Visit: Payer: Self-pay

## 2015-07-04 DIAGNOSIS — Z1231 Encounter for screening mammogram for malignant neoplasm of breast: Secondary | ICD-10-CM

## 2015-07-04 NOTE — Telephone Encounter (Signed)
Medication refill request: Prozac  Last AEX:  05/31/14 SM Next AEX: 07/09/15 SM Last MMG (if hormonal medication request): 04/23/13 BIRADS1:neg Refill authorized: 05/31/14 #30tabs / 13R. Today please advise.

## 2015-07-04 NOTE — Telephone Encounter (Signed)
Called patient. She states she still has not had a MMG since 2014. Informed pt about Breast cancer risk  When taking hormones and that Dr. Quincy Simmonds will not refill Rx. She can speak to Dr. Sabra Heck about it at her office visit. Pt verbalized understanding.

## 2015-07-04 NOTE — Telephone Encounter (Signed)
Medication refill request: Progesterone  Last AEX:  05/31/14 SM Next AEX: 07/09/15 SM Last MMG (if hormonal medication request): 04/23/13 BIRADS1:Neg Refill authorized: 05/31/14 #30caps/ 13R Today please advise.   Routed to Dr. Quincy Simmonds

## 2015-07-04 NOTE — Telephone Encounter (Signed)
Patient needs mammogram.  If she has done this, please let us know where, so we can get a copy.  No refill of Prometrium at this time.  Has an appointment with Dr. Sabra Heck in a few days.

## 2015-07-08 ENCOUNTER — Ambulatory Visit
Admission: RE | Admit: 2015-07-08 | Discharge: 2015-07-08 | Disposition: A | Discharge status: Home / Self Care | Payer: 59 | Source: Ambulatory Visit

## 2015-07-08 DIAGNOSIS — Z1231 Encounter for screening mammogram for malignant neoplasm of breast: Secondary | ICD-10-CM

## 2015-07-09 ENCOUNTER — Encounter: Payer: Self-pay | Admitting: Obstetrics & Gynecology

## 2015-07-09 ENCOUNTER — Encounter: Payer: Self-pay | Admitting: Gastroenterology

## 2015-07-09 ENCOUNTER — Ambulatory Visit (INDEPENDENT_AMBULATORY_CARE_PROVIDER_SITE_OTHER): Payer: 59 | Admitting: Obstetrics & Gynecology

## 2015-07-09 VITALS — BP 130/78 | HR 96 | Resp 18 | Ht 62.5 in | Wt 161.0 lb

## 2015-07-09 DIAGNOSIS — Z01419 Encounter for gynecological examination (general) (routine) without abnormal findings: Secondary | ICD-10-CM

## 2015-07-09 MED ORDER — FLUOXETINE HCL 20 MG PO TABS: 20.0000 mg | ORAL_TABLET | Freq: Every day | ORAL | Status: DC

## 2015-07-09 MED ORDER — ESTRADIOL 0.0375 MG/24HR TD PTTW: 1.0000 | MEDICATED_PATCH | TRANSDERMAL | Status: DC

## 2015-07-09 MED ORDER — PROGESTERONE MICRONIZED 100 MG PO CAPS: 100.0000 mg | ORAL_CAPSULE | Freq: Every day | ORAL | Status: DC
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
## 2015-08-14 ENCOUNTER — Other Ambulatory Visit: Payer: Self-pay | Admitting: Certified Nurse Midwife

## 2015-08-15 NOTE — Telephone Encounter (Signed)
Medication refill request: Prozac  Last AEX:  07/09/15 SM Next AEX: 10/28/16 SM Last MMG (if hormonal medication request): 07/09/15 BIRADS1:neg Refill authorized: 07/09/15 #90tabs w/4R To [facility] [address] [place]

## 2015-08-25 ENCOUNTER — Ambulatory Visit (AMBULATORY_SURGERY_CENTER): Payer: Self-pay

## 2015-08-25 VITALS — Ht 62.0 in | Wt 159.8 lb

## 2015-08-25 DIAGNOSIS — Z860100 Personal history of colon polyps, unspecified: Secondary | ICD-10-CM

## 2015-08-25 MED ORDER — SUPREP BOWEL PREP KIT 17.5-3.13-1.6 GM/177ML PO SOLN: 1.0000 | Freq: Once | ORAL | Status: DC

## 2015-08-25 NOTE — Progress Notes (Signed)
No allergies to eggs or soy No diet/weight loss meds No home oxygen No  Past problems with anesthesia (feels it doesn't take much for her but said she did well with last colonoscopy)  Has email and internet

## 2015-09-08 ENCOUNTER — Encounter: Payer: 59 | Admitting: Gastroenterology

## 2015-11-06 ENCOUNTER — Other Ambulatory Visit: Payer: Self-pay | Admitting: Cardiology

## 2015-11-06 NOTE — Telephone Encounter (Signed)
Rx request sent to pharmacy.  

## 2015-12-15 ENCOUNTER — Emergency Department (HOSPITAL_COMMUNITY)
Admission: EM | Admit: 2015-12-15 | Discharge: 2015-12-15 | Disposition: A | Discharge status: Home / Self Care | Payer: 59 | Attending: Emergency Medicine | Admitting: Emergency Medicine

## 2015-12-15 DIAGNOSIS — W2112XA Struck by tennis racquet, initial encounter: Secondary | ICD-10-CM | POA: Diagnosis not present

## 2015-12-15 DIAGNOSIS — Y92312 Tennis court as the place of occurrence of the external cause: Secondary | ICD-10-CM | POA: Insufficient documentation

## 2015-12-15 DIAGNOSIS — I10 Essential (primary) hypertension: Secondary | ICD-10-CM | POA: Insufficient documentation

## 2015-12-15 DIAGNOSIS — Y998 Other external cause status: Secondary | ICD-10-CM | POA: Insufficient documentation

## 2015-12-15 DIAGNOSIS — Y9373 Activity, racquet and hand sports: Secondary | ICD-10-CM | POA: Diagnosis not present

## 2015-12-15 DIAGNOSIS — S0990XA Unspecified injury of head, initial encounter: Secondary | ICD-10-CM | POA: Insufficient documentation

## 2015-12-15 NOTE — ED Notes (Signed)
Explained current wait times. Pt states she will followup with PMD. Left ambulatory NAD.
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
## 2016-03-09 ENCOUNTER — Other Ambulatory Visit: Payer: Self-pay | Admitting: Cardiology

## 2016-03-09 NOTE — Telephone Encounter (Signed)
New Message   *STAT* If patient is at the pharmacy, call can be transferred to refill team.   1. Which medications need to be refilled? (please list name of each medication and dose if known) telmisartan-hydrochlorothiazide (MICARDIS HCT) 80-25 MG per tablet  2. Which pharmacy/location (including street and city if local pharmacy) is medication to be sent to? [facility] on [id number]  3. Do they need a 30 day or 90 day supply? 30   Pt called states that she is out of this medication. Appt made for 03/29/2016 at 1:30p

## 2016-03-09 NOTE — Telephone Encounter (Signed)
Rx(s) sent to pharmacy electronically.  

## 2016-03-29 ENCOUNTER — Encounter: Payer: Self-pay | Admitting: Cardiology

## 2016-03-29 ENCOUNTER — Ambulatory Visit (INDEPENDENT_AMBULATORY_CARE_PROVIDER_SITE_OTHER): Payer: 59 | Admitting: Cardiology

## 2016-03-29 VITALS — BP 114/60 | HR 89 | Ht 63.0 in | Wt 158.8 lb

## 2016-03-29 DIAGNOSIS — I493 Ventricular premature depolarization: Secondary | ICD-10-CM | POA: Diagnosis not present

## 2016-03-29 LAB — CBC
HCT: 42.5 % (ref 35.0–45.0)
Hemoglobin: 14.6 g/dL (ref 11.7–15.5)
MCH: 32.2 pg (ref 27.0–33.0)
MCHC: 34.4 g/dL (ref 32.0–36.0)
MCV: 93.8 fL (ref 80.0–100.0)
MPV: 9.5 fL (ref 7.5–12.5)
Platelets: 308 10*3/uL (ref 140–400)
RBC: 4.53 MIL/uL (ref 3.80–5.10)
RDW: 14.1 % (ref 11.0–15.0)
WBC: 17 10*3/uL — ABNORMAL HIGH (ref 3.8–10.8)

## 2016-03-29 MED ORDER — METOPROLOL TARTRATE 25 MG PO TABS: 12.5000 mg | ORAL_TABLET | Freq: Two times a day (BID) | ORAL | Status: DC

## 2016-03-29 NOTE — Progress Notes (Signed)
03/29/2016 Alice Pollard   1951-01-28  [id number]  Primary Physician No PCP Per Patient Primary Cardiologist: Dr. Martinique.    Reason for Visit/CC: 1 year f/u for HTN and DLD  HPI:  65 y/o female with h/o HTN and DLD, who presents for routine 1 yr f/u. She is followed by Dr. Martinique. She has no known h/o CAD. There is a family history of CHF in both parents. Her last lipid panel was in 2015. LDL was elevated at 143 mg/dL. She has been intolerant to Lipitor in the past. Dr. Martinique started her on low dose Crestor last year.  Today in f/u, she reports that she has done well. No CP nor dyspnea. She feels some mild fatigue with exertion. No syncope/ near syncope. EKG today shows PVCs/ ventricular bigeminy. HR 89 bpm. She feels occasional palpitations but noting significant. She did drink 2 cups of coffee and a diet coke earlier. She is not on a BB.   She reports that she stopped Crestor due to myalgias. BP is stable at 114/60. She notes pressures have been on the lower side at home recently. She takes amlodipine.    Current Outpatient Prescriptions  Medication Sig Dispense Refill  . amLODipine (NORVASC) 5 MG tablet TAKE 1 TABLET(5 MG) BY MOUTH DAILY 30 tablet 4  . CALCIUM PO Take by mouth daily.      . Cetirizine HCl (ZYRTEC PO) Take by mouth.    . estradiol (MINIVELLE) 0.0375 MG/24HR Place 1 patch onto the skin 2 (two) times a week. 24 patch 4  . FLUoxetine (PROZAC) 20 MG tablet Take 1 tablet (20 mg total) by mouth daily. 90 tablet 4  . fluticasone (FLONASE) 50 MCG/ACT nasal spray Place 1 spray into both nostrils as needed.  0  . montelukast (SINGULAIR) 10 MG tablet Take 10 mg by mouth as needed.    . progesterone (PROMETRIUM) 100 MG capsule Take 1 capsule (100 mg total) by mouth daily. 90 capsule 4  . SUPREP BOWEL PREP SOLN Take 1 kit by mouth once. 354 mL 0  . telmisartan-hydrochlorothiazide (MICARDIS HCT) 80-25 MG tablet TAKE 1 TABLET BY MOUTH EVERY DAY 30 tablet 0   No current  facility-administered medications for this visit.    Allergies  Allergen Reactions  . Codeine     Fainted   . Latex     sensitivity    Social History   Social History  . Marital Status: Married    Spouse Name: N/A  . Number of Children: 3  . Years of Education: N/A   Occupational History  . Journalist, newspaper    Social History Main Topics  . Smoking status: Never Smoker   . Smokeless tobacco: Never Used  . Alcohol Use: 1.8 oz/week    3 Standard drinks or equivalent per week  . Drug Use: No  . Sexual Activity:    Partners: Male    Birth Control/ Protection: Post-menopausal     Comment: vasectomy   Other Topics Concern  . Not on file   Social History Narrative     Review of Systems: General: negative for chills, fever, night sweats or weight changes.  Cardiovascular: negative for chest pain, dyspnea on exertion, edema, orthopnea, palpitations, paroxysmal nocturnal dyspnea or shortness of breath Dermatological: negative for rash Respiratory: negative for cough or wheezing Urologic: negative for hematuria Abdominal: negative for nausea, vomiting, diarrhea, bright red blood per rectum, melena, or hematemesis Neurologic: negative for visual changes, syncope, or dizziness All other systems  reviewed and are otherwise negative except as noted above.    Blood pressure 114/60, pulse 89, height '5\' 3"'  (1.6 m), weight 158 lb 12.8 oz (72.031 kg).  General appearance: alert, cooperative and no distress Neck: no carotid bruit and no JVD Lungs: clear to auscultation bilaterally Heart: regular rate and rhythm and PVCs Extremities: no LEE Pulses: 2+ and symmetric Skin: warm and dry Neurologic: Grossly normal  EKG PVCs/ bigeminy 89 bpm   ASSESSMENT AND PLAN:   1. HTN: controlled at 114/60. Stop amlodipine to allow addition of metoprolol.   2. DLD: her last FLP was in 2015. LDL was 143 mg/dL. She has been intolerant to Lipitor but was placed on Crestor last year. She  stopped taking this as well due to side effects.  We will check a FLP + HFTs. If still elevated, we can consider referral to Lipid clinic for consideration of PCSK9 inhibitors.   3. PVCs: EKG shows ventricular bigeminy. HR 89 bpm. She is fairly asymptomatic. Start metoprolol, 12.5 mg BID. Check thyroid function, K, Mg and CBC. 2D echo to assess LVF.  Reduce caffeine consumption. Return in 1 week for repeat EKG. If continued PVCs are noted on f/u EKG, will arrange 48 holter to assess PVC burden. She denies any CP. No plans for ischemic w/u at this point.   PLAN  Repeat EKG/ BP check in 1 week with RN. Will advised further f/u after echo results and f/u EKG.   Lyda Jester PA-C 03/29/2016 2:05 PM

## 2016-03-29 NOTE — Patient Instructions (Addendum)
Medication Instructions:   STOP TAKING AMLODIPINE  START TAKING METOPROLOL 12.5 TWICE A DAY   If you need a refill on your cardiac medications before your next appointment, please call your pharmacy.  Labwork:  CBC CMP MAG AND TSH   RETURN IN A WEEK FOR FASTING LIPIDS   Testing/Procedures:  Your physician has requested that you have an echocardiogram. Echocardiography is a painless test that uses sound waves to create images of your heart. It provides your doctor with information about the size and shape of your heart and how well your heart's chambers and valves are working. This procedure takes approximately one hour. There are no restrictions for this procedure.   Follow-Up: IN A WEEK FOR EKG REPEAT  WITH PHARM D   WITH AN AVAIALBLE APP IN 2 WEEKS    Any Other Special Instructions Will Be Listed Below (If Applicable).

## 2016-03-30 LAB — COMPREHENSIVE METABOLIC PANEL
ALT: 12 U/L (ref 6–29)
AST: 19 U/L (ref 10–35)
Albumin: 4.4 g/dL (ref 3.6–5.1)
Alkaline Phosphatase: 79 U/L (ref 33–130)
BUN: 14 mg/dL (ref 7–25)
CO2: 27 mmol/L (ref 20–31)
Calcium: 9.5 mg/dL (ref 8.6–10.4)
Chloride: 102 mmol/L (ref 98–110)
Creat: 0.65 mg/dL (ref 0.50–0.99)
Glucose, Bld: 93 mg/dL (ref 65–99)
Potassium: 3.7 mmol/L (ref 3.5–5.3)
Sodium: 143 mmol/L (ref 135–146)
Total Bilirubin: 0.7 mg/dL (ref 0.2–1.2)
Total Protein: 6.7 g/dL (ref 6.1–8.1)

## 2016-03-30 LAB — MAGNESIUM: Magnesium: 2.1 mg/dL (ref 1.5–2.5)

## 2016-03-30 LAB — TSH: TSH: 2.8 mIU/L

## 2016-04-01 ENCOUNTER — Telehealth: Payer: Self-pay | Admitting: Cardiology

## 2016-04-01 DIAGNOSIS — D72829 Elevated white blood cell count, unspecified: Secondary | ICD-10-CM

## 2016-04-01 NOTE — Telephone Encounter (Signed)
Returned call to patient. She states she had gotten a call about some cancellations for Dr. Martinique? Informed her that I have non record of who may have tried to reach her about this, but that J. Witty, MA had tried to contact her yesterday regarding her labs.  Provided patient w/lab results Her WBC count was elevated - patient reports she had taken an abx about 3 weeks ago for a "clogged ear". She got the Rx while in [place]. Pleasant visiting her daughter. She did have a cough/sore throat with this.   Will defer to B. Simmons, PA to advise on WBC follow up

## 2016-04-01 NOTE — Telephone Encounter (Signed)
Per pt call  Please give her a call back per pt and epic she saw a PA.  And would like to know if the DR still wants to see her.

## 2016-04-02 NOTE — Telephone Encounter (Signed)
Any fever or chills now? Does her ear feel better? Recommend f/u CBC in 1 week to ensure white count comes down. It was pretty high at 17.

## 2016-04-02 NOTE — Telephone Encounter (Signed)
Follow-up ° ° ° ° °The pt is returning the nurses call °

## 2016-04-02 NOTE — Telephone Encounter (Signed)
Recommendations communicated to patient, who voiced understanding.  She reports feeling better, no problems today, feels like the issue is resolved. Since WBC significantly elevated, she will heed advice and return for f/u CBC.  Pt aware to call if further questions or concerns.

## 2016-04-02 NOTE — Telephone Encounter (Signed)
LMTCB

## 2016-04-13 ENCOUNTER — Other Ambulatory Visit: Payer: Self-pay | Admitting: Cardiology

## 2016-04-16 ENCOUNTER — Other Ambulatory Visit (INDEPENDENT_AMBULATORY_CARE_PROVIDER_SITE_OTHER): Payer: 59

## 2016-04-16 ENCOUNTER — Ambulatory Visit (HOSPITAL_COMMUNITY): Payer: 59 | Attending: Cardiology

## 2016-04-16 ENCOUNTER — Other Ambulatory Visit: Payer: Self-pay

## 2016-04-16 DIAGNOSIS — E785 Hyperlipidemia, unspecified: Secondary | ICD-10-CM | POA: Diagnosis not present

## 2016-04-16 DIAGNOSIS — I10 Essential (primary) hypertension: Secondary | ICD-10-CM | POA: Insufficient documentation

## 2016-04-16 DIAGNOSIS — I493 Ventricular premature depolarization: Secondary | ICD-10-CM

## 2016-04-16 DIAGNOSIS — D72829 Elevated white blood cell count, unspecified: Secondary | ICD-10-CM

## 2016-04-16 DIAGNOSIS — I071 Rheumatic tricuspid insufficiency: Secondary | ICD-10-CM | POA: Diagnosis not present

## 2016-04-16 DIAGNOSIS — I340 Nonrheumatic mitral (valve) insufficiency: Secondary | ICD-10-CM | POA: Insufficient documentation

## 2016-04-16 DIAGNOSIS — R9431 Abnormal electrocardiogram [ECG] [EKG]: Secondary | ICD-10-CM | POA: Diagnosis present

## 2016-04-16 LAB — LIPID PANEL
Cholesterol: 225 mg/dL — ABNORMAL HIGH (ref 125–200)
HDL: 56 mg/dL (ref >=46)
LDL Cholesterol: 118 mg/dL (ref <130)
Total CHOL/HDL Ratio: 4 Ratio (ref <=5.0)
Triglycerides: 257 mg/dL — ABNORMAL HIGH (ref <150)
VLDL: 51 mg/dL — ABNORMAL HIGH (ref <30)

## 2016-04-16 LAB — CBC
HCT: 42.2 % (ref 35.0–45.0)
Hemoglobin: 14.3 g/dL (ref 11.7–15.5)
MCH: 32.8 pg (ref 27.0–33.0)
MCHC: 33.9 g/dL (ref 32.0–36.0)
MCV: 96.8 fL (ref 80.0–100.0)
MPV: 9 fL (ref 7.5–12.5)
Platelets: 224 10*3/uL (ref 140–400)
RBC: 4.36 MIL/uL (ref 3.80–5.10)
RDW: 14.4 % (ref 11.0–15.0)
WBC: 12.3 10*3/uL — ABNORMAL HIGH (ref 3.8–10.8)

## 2016-04-18 NOTE — Progress Notes (Signed)
Cardiology Office Note:    Date:  04/19/2016   ID:  Alice Pollard, DOB [birth year], MRN [id number]  PCP:  No PCP Per Patient  Cardiologist:  Dr. Peter Martinique   Electrophysiologist:  n/a  Referring MD: No ref. provider found   Chief Complaint  Patient presents with  . Follow-up    PVCs    History of Present Illness:    Alice Pollard is a 65 y.o. female with a hx of HTN, HL, PVCs.  Last seen in clinic by Lyda Jester, PA-C 03/29/16 for annual FU.  Her ECG demonstrated asymptomatic PVCs in a bigeminal pattern. Echocardiogram demonstrated normal LV function. Patient was placed on Metoprolol tartrate 12.5 bid.    Returns for FU.  She never started the Metoprolol. She has no real complaints. Denies palpitations. She denies chest pain, significant DOE, orthopnea, PND, edema, syncope, near syncope.  Past Medical History  Diagnosis Date  . HTN (hypertension)   . Dyslipidemia   . Endometriosis   . ADD (attention deficit disorder)   . Hematuria     h/o asymptomatic    Past Surgical History  Procedure Laterality Date  . Laparoscopy      endometriosis    Current Medications: Outpatient Prescriptions Prior to Visit  Medication Sig Dispense Refill  . CALCIUM PO Take 1 tablet by mouth daily. PATIENT NOT SURE OF DOSAGE    . Cetirizine HCl (ZYRTEC PO) Take 1 tablet by mouth daily.     Marland Kitchen estradiol (MINIVELLE) 0.0375 MG/24HR Place 1 patch onto the skin 2 (two) times a week. 24 patch 4  . FLUoxetine (PROZAC) 20 MG tablet Take 1 tablet (20 mg total) by mouth daily. 90 tablet 4  . fluticasone (FLONASE) 50 MCG/ACT nasal spray Place 1 spray into both nostrils daily as needed for allergies.   0  . metoprolol tartrate (LOPRESSOR) 25 MG tablet Take 0.5 tablets (12.5 mg total) by mouth 2 (two) times daily. (Patient not taking: Reported on 04/19/2016) 60 tablet 5  . montelukast (SINGULAIR) 10 MG tablet Take 10 mg by mouth daily as needed (for allergies).     Marland Kitchen  telmisartan-hydrochlorothiazide (MICARDIS HCT) 80-25 MG tablet TAKE 1 TABLET BY MOUTH EVERY DAY 30 tablet 0  . progesterone (PROMETRIUM) 100 MG capsule Take 1 capsule (100 mg total) by mouth daily. (Patient not taking: Reported on 04/19/2016) 90 capsule 4  . SUPREP BOWEL PREP SOLN Take 1 kit by mouth once. (Patient not taking: Reported on 04/19/2016) 354 mL 0   No facility-administered medications prior to visit.      Allergies:   Codeine and Latex   Social History   Social History  . Marital Status: Married    Spouse Name: N/A  . Number of Children: 3  . Years of Education: N/A   Occupational History  . Journalist, newspaper    Social History Main Topics  . Smoking status: Never Smoker   . Smokeless tobacco: Never Used  . Alcohol Use: 1.8 oz/week    3 Standard drinks or equivalent per week  . Drug Use: No  . Sexual Activity:    Partners: Male    Birth Control/ Protection: Post-menopausal     Comment: vasectomy   Other Topics Concern  . None   Social History Narrative     Family History:  The patient's family history includes Diabetes in her father; Heart failure in her mother; Other in her brother. There is no history of Colon cancer.   ROS:  Please see the history of present illness.    ROS All other systems reviewed and are negative.   Physical Exam:    VS:  BP 160/70 mmHg  Pulse 88  Ht 5' 3.5" (1.613 m)  Wt 160 lb (72.576 kg)  BMI 27.89 kg/m2    Wt Readings from Last 3 Encounters:  04/19/16 160 lb (72.576 kg)  03/29/16 158 lb 12.8 oz (72.031 kg)  08/25/15 159 lb 12.8 oz (72.485 kg)     Physical Exam  Constitutional: She is oriented to person, place, and time. She appears well-developed and well-nourished. No distress.  HENT:  Head: Normocephalic and atraumatic.  Neck: No JVD present.  Cardiovascular: Normal rate and normal heart sounds.  An irregular rhythm present.  No murmur heard. Pulmonary/Chest: Effort normal. She has no wheezes. She has no rales.    Abdominal: Soft. There is no tenderness.  Musculoskeletal: She exhibits no edema.  Neurological: She is alert and oriented to person, place, and time.  Skin: Skin is warm and dry.  Psychiatric: She has a normal mood and affect.    Studies/Labs Reviewed:   EKG:  EKG is  ordered today.  The ekg ordered today demonstrates NSR, HR 88, normal axis, QTc 467 ms, unifocal PVCs, bigeminy, similar to prior tracing  Recent Labs: 03/29/2016: ALT 12; BUN 14; Creat 0.65; Magnesium 2.1; Potassium 3.7; Sodium 143; TSH 2.80 04/16/2016: Hemoglobin 14.3; Platelets 224   Recent Lipid Panel    Component Value Date/Time   CHOL 225* 04/16/2016 1002   TRIG 257* 04/16/2016 1002   HDL 56 04/16/2016 1002   CHOLHDL 4.0 04/16/2016 1002   VLDL 51* 04/16/2016 1002   LDLCALC 118 04/16/2016 1002   LDLDIRECT 121.6 01/15/2013 0915    Additional studies/ records that were reviewed today include:   Echo 04/16/16 Vigorous LVEF, EF 65-70%, normal wall motion, grade 1 diastolic dysfunction, trivial MR  ETT-Echo 3/08 Normal    ASSESSMENT:    1. PVC (premature ventricular contraction)   2. Essential hypertension   3. Dyslipidemia    PLAN:    In order of problems listed above:  1. PVCs - She has frequent PVCs on ECG.  She is asymptomatic. EF is normal on recent Echo. Recent K+, Mg2+, TSH normal.  She denies significant caffeine intake.  She does admit to increased stress.  She has to go to [place] soon to settle her father's estate.  She did not start the metoprolol as recommended previously.    -  Start Metoprolol tartrate 12.5 bid  -  Arrange 24 Hr Holter to assess PVC burden >> if 12% or more, refer to EP  2. HTN - BP elevated.  She was stuck in the elevator coming here.  She has not started Metoprolol.  -  Start Metoprolol as noted.  -  Keep eye on BP at home and call if remains elevated.   3. HL - LDL in 7/17 was 118.  10 year ASCVD risk 6.3%.  Therefore, mod intensity Rx is a reasonable treatment  option.  We discussed the role of diet and exercise.  She is not interested in taking statins.  If future Lipid Panel is unchanged, consider Zetia or referral to Lipid Clinic.     Medication Adjustments/Labs and Tests Ordered: Current medicines are reviewed at length with the patient today.  Concerns regarding medicines are outlined above.  Medication changes, Labs and Tests ordered today are outlined in the Patient Instructions noted below. Patient Instructions  Medication Instructions:  Your physician recommends that you continue on your current medications as directed. Please refer to the Current Medication list given to you today. Labwork: NONE Testing/Procedures: Your physician has recommended that you wear a 24 HOUR holter monitor. Holter monitors are medical devices that record the heart's electrical activity. Doctors most often use these monitors to diagnose arrhythmias. Arrhythmias are problems with the speed or rhythm of the heartbeat. The monitor is a small, portable device. You can wear one while you do your normal daily activities. This is usually used to diagnose what is causing palpitations/syncope (passing out). Follow-Up: DR. Martinique 3 MONTHS  Any Other Special Instructions Will Be Listed Below (If Applicable). If you need a refill on your cardiac medications before your next appointment, please call your pharmacy.   Signed, Richardson Dopp, PA-C  04/19/2016 4:06 PM    [facility] [place], [place], [place]  [address] Phone: [phone]; Fax: [phone]

## 2016-04-19 ENCOUNTER — Ambulatory Visit (INDEPENDENT_AMBULATORY_CARE_PROVIDER_SITE_OTHER): Payer: 59 | Admitting: Physician Assistant

## 2016-04-19 ENCOUNTER — Encounter: Payer: Self-pay | Admitting: Physician Assistant

## 2016-04-19 VITALS — BP 160/70 | HR 88 | Ht 63.5 in | Wt 160.0 lb

## 2016-04-19 DIAGNOSIS — I10 Essential (primary) hypertension: Secondary | ICD-10-CM | POA: Diagnosis not present

## 2016-04-19 DIAGNOSIS — I493 Ventricular premature depolarization: Secondary | ICD-10-CM | POA: Insufficient documentation

## 2016-04-19 DIAGNOSIS — E785 Hyperlipidemia, unspecified: Secondary | ICD-10-CM | POA: Diagnosis not present

## 2016-04-19 NOTE — Patient Instructions (Addendum)
Medication Instructions:  Your physician recommends that you continue on your current medications as directed. Please refer to the Current Medication list given to you today. Labwork: NONE Testing/Procedures: Your physician has recommended that you wear a 24 HOUR holter monitor. Holter monitors are medical devices that record the heart's electrical activity. Doctors most often use these monitors to diagnose arrhythmias. Arrhythmias are problems with the speed or rhythm of the heartbeat. The monitor is a small, portable device. You can wear one while you do your normal daily activities. This is usually used to diagnose what is causing palpitations/syncope (passing out). Follow-Up: DR. Martinique 3 MONTHS  Any Other Special Instructions Will Be Listed Below (If Applicable). If you need a refill on your cardiac medications before your next appointment, please call your pharmacy.

## 2016-04-20 ENCOUNTER — Telehealth: Payer: Self-pay | Admitting: *Deleted

## 2016-04-20 NOTE — Telephone Encounter (Signed)
Per Ellen Henri, PA-C, called pt re: Echo, CBC, LIPID results.I mistakenly checked reviewed results on echo, lipid panel and CBC before commenting on results, thus sending results through staff message.   Her 2D echo showed normal LVEF and no significant valvular abnormalities. Pump function is normal.   Her white count is still elevated but level is coming down from 17 to 12. Normal is from 3.8-10.8. Not sure what to make of this, but if no fever, chills, n/v/d or dysuria, or productive cough, no further w/u advised at this point.   LDL is improved from 143 - 118. Triglycerides are still high. She should at least try to take Crestor every other day if she cannot tolerate daily due to side effects. Also monitor diet. Low fat diet advised.   Pt has been made aware and she actually saw Nicki Reaper 04/19/16 and he told her to just work on her diet to get Triglycerides down, but she will work on taking the crestor qod.

## 2016-05-12 ENCOUNTER — Encounter: Payer: Self-pay | Admitting: *Deleted

## 2016-05-12 NOTE — Progress Notes (Signed)
Patient ID: Alice Pollard, female   DOB: 1951-02-11, 65 y.o.   MRN: [id number] Patient did not show up for 05/12/16, 10:30 AM, appointment to have a 24 hour holter monitor applied.

## 2016-05-12 NOTE — Progress Notes (Signed)
Please see if patient would like to reschedule the Holter. Richardson Dopp, PA-C   05/12/2016 2:28 PM

## 2016-05-15 ENCOUNTER — Other Ambulatory Visit: Payer: Self-pay | Admitting: Cardiology

## 2016-05-17 ENCOUNTER — Other Ambulatory Visit: Payer: Self-pay | Admitting: Cardiology

## 2016-05-17 NOTE — Telephone Encounter (Signed)
Rx(s) sent to pharmacy electronically.  

## 2016-05-18 ENCOUNTER — Telehealth: Payer: Self-pay | Admitting: *Deleted

## 2016-05-18 NOTE — Telephone Encounter (Signed)
S/w pt today who states she cancelled monitor appt due to she had just got back from a trip. Pt is agreeable to plan of care to reschedule monitor appt. I will have [facility] call pt to schedule new appt for monitor.

## 2016-05-18 NOTE — Progress Notes (Signed)
S/w pt today who states she cancelled monitor appt due to she had just got back from a trip. Pt is agreeable to plan of care to reschedule monitor appt. I will have [facility] call pt to schedule new appt for monitor.

## 2016-05-25 ENCOUNTER — Ambulatory Visit (INDEPENDENT_AMBULATORY_CARE_PROVIDER_SITE_OTHER): Payer: 59

## 2016-05-25 DIAGNOSIS — I493 Ventricular premature depolarization: Secondary | ICD-10-CM

## 2016-07-23 ENCOUNTER — Ambulatory Visit: Payer: 59 | Admitting: Cardiology

## 2016-07-29 ENCOUNTER — Other Ambulatory Visit: Payer: Self-pay | Admitting: Obstetrics & Gynecology

## 2016-07-29 NOTE — Telephone Encounter (Signed)
Medication refill request: VIVELLE-DOT Last AEX:  07/09/15 SM Next AEX: 10/28/16 Last MMG (if hormonal medication request): 07/08/15 BIRADS 1 negative Refill authorized: 07/09/15 #24 w/4 refills; today please advise   Medication refill request: PROMETRIUM Refill authorized: per chart Rx was discontinued 04/19/16 by Sheralyn Boatman, CMA for the following reason: Completed Course;  Today please advise, should patient still be taking this?

## 2016-10-03 NOTE — Progress Notes (Signed)
Cardiology Office Note:    Date:  10/05/2016   ID:  [name], DOB 12/07/1950, MRN [id number]  PCP:  No PCP Per Patient  Cardiologist:  Dr. Naman Spychalski Martinique   Electrophysiologist:  n/a  Referring MD: No ref. provider found   Chief Complaint  Patient presents with  . Palpitations    History of Present Illness:    Alice Pollard is a 65 y.o. female with a hx of HTN, HL, PVCs.  Seen in clinic by Lyda Jester, PA-C 03/29/16 for annual FU.  Her ECG demonstrated asymptomatic PVCs in a bigeminal pattern. Echocardiogram demonstrated normal LV function. Patient was placed on Metoprolol tartrate 12.5 bid.    Seen by Richardson Dopp PAC in July.  She never started the Metoprolol. This was resumed and a follow up Holter done in August.  On follow up today she has no real complaints. Denies palpitations. She denies chest pain, significant DOE, orthopnea, PND, edema, syncope, near syncope. Her BP has improved on metoprolol. She notes she has less stress.   Past Medical History:  Diagnosis Date  . ADD (attention deficit disorder)   . Dyslipidemia   . Endometriosis   . Hematuria    h/o asymptomatic  . HTN (hypertension)     Past Surgical History:  Procedure Laterality Date  . LAPAROSCOPY     endometriosis    Current Medications: Outpatient Medications Prior to Visit  Medication Sig Dispense Refill  . CALCIUM PO Take 1 tablet by mouth daily. PATIENT NOT SURE OF DOSAGE    . Cetirizine HCl (ZYRTEC PO) Take 1 tablet by mouth daily.     Marland Kitchen FLUoxetine (PROZAC) 20 MG tablet Take 1 tablet (20 mg total) by mouth daily. 90 tablet 4  . fluticasone (FLONASE) 50 MCG/ACT nasal spray Place 1 spray into both nostrils daily as needed for allergies.   0  . metoprolol tartrate (LOPRESSOR) 25 MG tablet Take 0.5 tablets (12.5 mg total) by mouth 2 (two) times daily. 60 tablet 5  . montelukast (SINGULAIR) 10 MG tablet Take 10 mg by mouth daily as needed (for allergies).     . progesterone (PROMETRIUM)  100 MG capsule TAKE 1 CAPSULE(100 MG) BY MOUTH DAILY 90 capsule 0  . telmisartan-hydrochlorothiazide (MICARDIS HCT) 80-25 MG tablet TAKE 1 TABLET BY MOUTH EVERY DAY 30 tablet 10  . VIVELLE-DOT 0.0375 MG/24HR UNWRAP AND APPLY 1 PATCH TO SKIN 2 TIMES A WEEK 24 patch 0   No facility-administered medications prior to visit.       Allergies:   Codeine and Latex   Social History   Social History  . Marital status: Married    Spouse name: N/A  . Number of children: 3  . Years of education: N/A   Occupational History  . Journalist, newspaper    Social History Main Topics  . Smoking status: Never Smoker  . Smokeless tobacco: Never Used  . Alcohol use 1.8 oz/week    3 Standard drinks or equivalent per week  . Drug use: No  . Sexual activity: Yes    Partners: Male    Birth control/ protection: Post-menopausal     Comment: vasectomy   Other Topics Concern  . None   Social History Narrative  . None     Family History:  The patient's family history includes Diabetes in her father; Heart failure in her mother; Other in her brother.   ROS:   Please see the history of present illness.    ROS All other  systems reviewed and are negative.   Physical Exam:    VS:  BP 104/72   Pulse 95   Ht 5\' 3"  (1.6 m)   Wt 158 lb (71.7 kg)   BMI 27.99 kg/m     Wt Readings from Last 3 Encounters:  10/05/16 158 lb (71.7 kg)  04/19/16 160 lb (72.6 kg)  03/29/16 158 lb 12.8 oz (72 kg)     Physical Exam  Constitutional: She is oriented to person, place, and time. She appears well-developed and well-nourished. No distress.  HENT:  Head: Normocephalic and atraumatic.  Neck: No JVD present.  Cardiovascular: Normal rate and normal heart sounds.  An irregular rhythm present.  No murmur heard. Pulmonary/Chest: Effort normal. She has no wheezes. She has no rales.  Abdominal: Soft. There is no tenderness.  Musculoskeletal: She exhibits no edema.  Neurological: She is alert and oriented to person,  place, and time.  Skin: Skin is warm and dry.  Psychiatric: She has a normal mood and affect.    Studies/Labs Reviewed:   EKG:  EKG is not ordered today.    Recent Labs: 03/29/2016: ALT 12; BUN 14; Creat 0.65; Magnesium 2.1; Potassium 3.7; Sodium 143; TSH 2.80 04/16/2016: Hemoglobin 14.3; Platelets 224   Recent Lipid Panel    Component Value Date/Time   CHOL 225 (H) 04/16/2016 1002   TRIG 257 (H) 04/16/2016 1002   HDL 56 04/16/2016 1002   CHOLHDL 4.0 04/16/2016 1002   VLDL 51 (H) 04/16/2016 1002   LDLCALC 118 04/16/2016 1002   LDLDIRECT 121.6 01/15/2013 0915    Additional studies/ records that were reviewed today include:   Echo 04/16/16 Vigorous LVEF, EF 65-70%, normal wall motion, grade 1 diastolic dysfunction, trivial MR  ETT-Echo 3/08 Normal   Holter monitor 05/25/16 showed occ. PVCs and rare PACs. Arrhythmia burden was quite low with total PVCs of only 452.  ASSESSMENT:    1. Essential hypertension   2. PVC (premature ventricular contraction)   3. Dyslipidemia    PLAN:    In order of problems listed above:  1. PVCs - She has infrequent  PVCs by Holter monitor on metoprolol.  She is asymptomatic. EF is normal on recent Echo. Recent K+, Mg2+, TSH normal.  She denies significant caffeine intake.  Recommend continuing current therapy. I will follow up in one year.  2. HTN - BP well controlled.    3. HL - LDL in 7/17 was 118.   She is not interested in taking statins.  Urge dietary modification and weight loss.    Medication Adjustments/Labs and Tests Ordered: Current medicines are reviewed at length with the patient today.  Concerns regarding medicines are outlined above.  Medication changes, Labs and Tests ordered today are outlined in the Patient Instructions noted below. Patient Instructions  Continue your current therapy  I will see you in one year    Signed, Juliah Scadden Martinique, MD  10/05/2016 9:55 AM    [facility] [place] [place],  [place], [place]  [address] Phone: [phone]; Fax: [phone]

## 2016-10-05 ENCOUNTER — Ambulatory Visit (INDEPENDENT_AMBULATORY_CARE_PROVIDER_SITE_OTHER): Payer: 59 | Admitting: Cardiology

## 2016-10-05 ENCOUNTER — Encounter: Payer: Self-pay | Admitting: Cardiology

## 2016-10-05 VITALS — BP 104/72 | HR 95 | Ht 63.0 in | Wt 158.0 lb

## 2016-10-05 DIAGNOSIS — I10 Essential (primary) hypertension: Secondary | ICD-10-CM

## 2016-10-05 DIAGNOSIS — E785 Hyperlipidemia, unspecified: Secondary | ICD-10-CM | POA: Diagnosis not present

## 2016-10-05 DIAGNOSIS — I493 Ventricular premature depolarization: Secondary | ICD-10-CM

## 2016-10-05 NOTE — Patient Instructions (Signed)
Continue your current therapy  I will see you in one year   

## 2016-10-28 ENCOUNTER — Ambulatory Visit (INDEPENDENT_AMBULATORY_CARE_PROVIDER_SITE_OTHER): Payer: 59 | Admitting: Obstetrics & Gynecology

## 2016-10-28 ENCOUNTER — Encounter: Payer: Self-pay | Admitting: Obstetrics & Gynecology

## 2016-10-28 VITALS — BP 118/76 | HR 72 | Resp 14 | Ht 63.0 in | Wt 154.0 lb

## 2016-10-28 DIAGNOSIS — Z1211 Encounter for screening for malignant neoplasm of colon: Secondary | ICD-10-CM

## 2016-10-28 DIAGNOSIS — Z124 Encounter for screening for malignant neoplasm of cervix: Secondary | ICD-10-CM | POA: Diagnosis not present

## 2016-10-28 DIAGNOSIS — Z01419 Encounter for gynecological examination (general) (routine) without abnormal findings: Secondary | ICD-10-CM

## 2016-10-28 DIAGNOSIS — Z0000 Encounter for general adult medical examination without abnormal findings: Secondary | ICD-10-CM

## 2016-10-28 LAB — POCT URINALYSIS DIPSTICK
Bilirubin, UA: NEGATIVE
Glucose, UA: NEGATIVE
Ketones, UA: NEGATIVE
Leukocytes, UA: NEGATIVE
Nitrite, UA: NEGATIVE
Urobilinogen, UA: NEGATIVE
pH, UA: 7

## 2016-10-28 MED ORDER — FLUOXETINE HCL 20 MG PO TABS: 20.0000 mg | ORAL_TABLET | Freq: Every day | ORAL | 4 refills | Status: DC

## 2016-10-28 MED ORDER — PROGESTERONE MICRONIZED 100 MG PO CAPS: 100.0000 mg | ORAL_CAPSULE | Freq: Every day | ORAL | 0 refills | Status: DC

## 2016-10-28 MED ORDER — ESTRADIOL 0.0375 MG/24HR TD PTTW: 1.0000 | MEDICATED_PATCH | TRANSDERMAL | 0 refills | Status: DC

## 2016-10-28 NOTE — Progress Notes (Signed)
66 y.o. G3P3 MarriedCaucasianF here for annual exam.  Doing well.  No vaginal bleeding.  Has four grandchildren now.  Needs referral for colonoscopy.    No LMP recorded. Patient is not currently having periods (Reason: Other).          Sexually active: Yes.    The current method of family planning is vasectomy.    Exercising: Yes.    golfing, walking Smoker:  no  Health Maintenance: Pap:  05/31/14 negative, HR HPV negative  History of abnormal Pap:  no MMG:  07/09/15 BIRADS 1 negative  Colonoscopy:  2008- repeat 5 years- patient states she would like referral  BMD:   04/19/13 normal- repeat 5 years  TDaP:  2017  Pneumonia vaccine(s):  Advised to start this Zostavax:   Would like to get it this year Hep C testing: will have drawn with next lab appointment Screening Labs: Cardiologist, Hb today: same, Urine today: trace protein, trace RBC's   reports that she has never smoked. She has never used smokeless tobacco. She reports that she drinks about 1.8 oz of alcohol per week . She reports that she does not use drugs.  Past Medical History:  Diagnosis Date  . ADD (attention deficit disorder)   . Dyslipidemia   . Endometriosis   . Hematuria    h/o asymptomatic  . HTN (hypertension)     Past Surgical History:  Procedure Laterality Date  . LAPAROSCOPY     endometriosis    Current Outpatient Prescriptions  Medication Sig Dispense Refill  . CALCIUM PO Take 1 tablet by mouth daily. PATIENT NOT SURE OF DOSAGE    . Cetirizine HCl (ZYRTEC PO) Take 1 tablet by mouth daily.     Marland Kitchen FLUoxetine (PROZAC) 20 MG tablet Take 1 tablet (20 mg total) by mouth daily. 90 tablet 4  . fluticasone (FLONASE) 50 MCG/ACT nasal spray Place 1 spray into both nostrils daily as needed for allergies.   0  . metoprolol tartrate (LOPRESSOR) 25 MG tablet Take 0.5 tablets (12.5 mg total) by mouth 2 (two) times daily. 60 tablet 5  . progesterone (PROMETRIUM) 100 MG capsule TAKE 1 CAPSULE(100 MG) BY MOUTH DAILY 90  capsule 0  . telmisartan-hydrochlorothiazide (MICARDIS HCT) 80-25 MG tablet TAKE 1 TABLET BY MOUTH EVERY DAY 30 tablet 10  . VIVELLE-DOT 0.0375 MG/24HR UNWRAP AND APPLY 1 PATCH TO SKIN 2 TIMES A WEEK 24 patch 0  . montelukast (SINGULAIR) 10 MG tablet Take 10 mg by mouth daily as needed (for allergies).      No current facility-administered medications for this visit.     Family History  Problem Relation Age of Onset  . Heart failure Mother   . Diabetes Father   . Other Brother     bilateral bladder  . Colon cancer Neg Hx     ROS:  Pertinent items are noted in HPI.  Otherwise, a comprehensive ROS was negative.  Exam:   BP 118/76 (BP Location: Right Arm, Patient Position: Sitting, Cuff Size: Normal)   Pulse 72   Resp 14   Ht 5\' 3"  (1.6 m)   Wt 154 lb (69.9 kg)   BMI 27.28 kg/m   Weight change: -3#  Height: 5\' 3"  (160 cm)  Ht Readings from Last 3 Encounters:  10/28/16 5\' 3"  (1.6 m)  10/05/16 5\' 3"  (1.6 m)  04/19/16 5' 3.5" (1.613 m)    General appearance: alert, cooperative and appears stated age Head: Normocephalic, without obvious abnormality, atraumatic Neck: no adenopathy,  supple, symmetrical, trachea midline and thyroid normal to inspection and palpation Lungs: clear to auscultation bilaterally Breasts: normal appearance, no masses or tenderness Heart: regular rate and rhythm Abdomen: soft, non-tender; bowel sounds normal; no masses,  no organomegaly Extremities: extremities normal, atraumatic, no cyanosis or edema Skin: Skin color, texture, turgor normal. No rashes or lesions Lymph nodes: Cervical, supraclavicular, and axillary nodes normal. No abnormal inguinal nodes palpated Neurologic: Grossly normal   Pelvic: External genitalia:  no lesions              Urethra:  normal appearing urethra with no masses, tenderness or lesions              Bartholins and Skenes: normal                 Vagina: normal appearing vagina with normal color and discharge, no lesions               Cervix: no lesions              Pap taken: Yes.   Bimanual Exam:  Uterus:  normal size, contour, position, consistency, mobility, non-tender              Adnexa: normal adnexa and no mass, fullness, tenderness               Rectovaginal: Confirms               Anus:  normal sphincter tone, no lesions  Chaperone was present for exam.  A:   Well Woman with normal exam  PMP, on HRT H/O microscopic hematuria Hypertension.  Sees Dr. Martinique yearly.   P:  Mammogram yearly.  She KNOWS it is due. Pap and HR HPV obtained today Colonoscopy referral will be made for pt Prozac 20mg  daily. #90/4RF Rx for minivelle dosage to 0.0375mg  patch twice weekly.  #24 patches/0RF.  Rx for Prometrium 100mg  qd. #90/0RF.  Will do additional RFs after MMG is completed. Order given for Shingrix and prevnar vaccines Pt declined blood work today.  Knows she should have Hep C antibody.  States will do with blood work the next time she gets it. return annually or prn

## 2016-10-29 LAB — IPS PAP TEST WITH HPV

## 2016-12-27 ENCOUNTER — Telehealth: Payer: Self-pay | Admitting: *Deleted

## 2016-12-27 NOTE — Telephone Encounter (Signed)
Message left to return call to Doneisha Ivey at [phone].    

## 2016-12-27 NOTE — Telephone Encounter (Signed)
-----   Message from Megan Salon, MD sent at 12/27/2016 10:17 AM EDT ----- Regarding: MMG Can you please call Mrs. Florene Glen and see if she's gotten her MMG yet?  If she has, can you call for results.  If not, can you remind her of the importance since she's on HRT and please document in a phone note?  Thanks.  Vinnie Level

## 2016-12-27 NOTE — Telephone Encounter (Signed)
Patient returned call. Patient states that she has not yet had her MMG done. RN stressed the importance of having MMG due to being on HRT. Patient verbalized understanding and stated, "I am going to do it, I just have an appointment with an internist and need to have a colonoscopy done before I have a MMG." RN offered to call [facility] and schedule MMG for patient, but she declined stating she would call when she got back to town so that she could look at her calendar. RN advised patient if she needed help scheduling she could call the office. Patient agreeable.   Routing to provider for final review. Patient agreeable to disposition. Will close encounter.

## 2017-01-05 DIAGNOSIS — I10 Essential (primary) hypertension: Secondary | ICD-10-CM | POA: Insufficient documentation

## 2017-01-05 DIAGNOSIS — D126 Benign neoplasm of colon, unspecified: Secondary | ICD-10-CM | POA: Insufficient documentation

## 2017-01-05 DIAGNOSIS — E785 Hyperlipidemia, unspecified: Secondary | ICD-10-CM | POA: Insufficient documentation

## 2017-01-05 DIAGNOSIS — R3121 Asymptomatic microscopic hematuria: Secondary | ICD-10-CM | POA: Insufficient documentation

## 2017-01-24 ENCOUNTER — Encounter: Payer: Self-pay | Admitting: Oncology

## 2017-01-24 ENCOUNTER — Telehealth: Payer: Self-pay | Admitting: Oncology

## 2017-01-24 NOTE — Telephone Encounter (Signed)
Appt has been scheduled for the pt to see Dr. Alen Blew on 5/18 at 11am. Pt agreed to the appt date and time. Demographics verified. Letter mailed to the pt and faxed to the referring. Pt aware to arrive 15-30 minutes early.

## 2017-02-18 ENCOUNTER — Other Ambulatory Visit (HOSPITAL_COMMUNITY)
Admission: RE | Admit: 2017-02-18 | Discharge: 2017-02-18 | Disposition: A | Discharge status: Home / Self Care | Payer: 59 | Source: Ambulatory Visit | Attending: Oncology | Admitting: Oncology

## 2017-02-18 ENCOUNTER — Ambulatory Visit (HOSPITAL_BASED_OUTPATIENT_CLINIC_OR_DEPARTMENT_OTHER): Payer: 59

## 2017-02-18 ENCOUNTER — Telehealth: Payer: Self-pay | Admitting: Oncology

## 2017-02-18 ENCOUNTER — Inpatient Hospital Stay: Payer: 59 | Attending: Oncology | Admitting: Oncology

## 2017-02-18 VITALS — BP 138/76 | HR 72 | Temp 98.1°F | Resp 19 | Ht 63.0 in | Wt 159.9 lb

## 2017-02-18 DIAGNOSIS — D72829 Elevated white blood cell count, unspecified: Secondary | ICD-10-CM | POA: Insufficient documentation

## 2017-02-18 DIAGNOSIS — Z809 Family history of malignant neoplasm, unspecified: Secondary | ICD-10-CM

## 2017-02-18 DIAGNOSIS — E875 Hyperkalemia: Secondary | ICD-10-CM | POA: Diagnosis not present

## 2017-02-18 DIAGNOSIS — D72820 Lymphocytosis (symptomatic): Secondary | ICD-10-CM

## 2017-02-18 DIAGNOSIS — I10 Essential (primary) hypertension: Secondary | ICD-10-CM

## 2017-02-18 LAB — CBC WITH DIFFERENTIAL/PLATELET
BASO%: 0.3 % (ref 0.0–2.0)
Basophils Absolute: 0.1 10*3/uL (ref 0.0–0.1)
EOS%: 0.5 % (ref 0.0–7.0)
Eosinophils Absolute: 0.1 10*3/uL (ref 0.0–0.5)
HCT: 42.3 % (ref 34.8–46.6)
HGB: 14.3 g/dL (ref 11.6–15.9)
LYMPH%: 66.6 % — ABNORMAL HIGH (ref 14.0–49.7)
MCH: 32 pg (ref 25.1–34.0)
MCHC: 33.8 g/dL (ref 31.5–36.0)
MCV: 94.8 fL (ref 79.5–101.0)
MONO#: 0.8 10*3/uL (ref 0.1–0.9)
MONO%: 4.2 % (ref 0.0–14.0)
NEUT#: 5.2 10*3/uL (ref 1.5–6.5)
NEUT%: 28.4 % — ABNORMAL LOW (ref 38.4–76.8)
Platelets: 252 10*3/uL (ref 145–400)
RBC: 4.46 10*6/uL (ref 3.70–5.45)
RDW: 12.8 % (ref 11.2–14.5)
WBC: 18.4 10*3/uL — ABNORMAL HIGH (ref 3.9–10.3)
lymph#: 12.3 10*3/uL — ABNORMAL HIGH (ref 0.9–3.3)

## 2017-02-18 LAB — TECHNOLOGIST REVIEW

## 2017-02-18 NOTE — Telephone Encounter (Signed)
Gave patient AVS and calender per 5/18 los.  

## 2017-02-18 NOTE — Progress Notes (Signed)
Reason for Referral: Leukocytosis.   HPI: 66 year old woman currently of [place] but originally from [place]. She is a rather healthy woman with history of hyperlipidemia and hypertension who has been in reasonably good health. She was noted to have leukocytosis on a preoperative evaluation prior to her colonoscopy. She established care with Dr. Forde Dandy in April 2018 and a CBC was repeated at that time. Her white cell count was 19.1 with normal hemoglobin and platelet count. Her lymphocytes were elevated at 67%. Her neutrophils were done at 28%. Her absolute lymphocyte count was 12.8. Microscopic examination showed atypical lymphocytes. Previous CBCs did show fluctuating leukocytosis. In June 2017 her white cell count was 17,000 with normal hemoglobin and platelet count. On 04/16/2016 her white cell count was 12.3. She does have seasonal allergies and does require allergy medication and injections. Most recently she have reported flareup and her allergies. Clinically she reports no other symptoms. She denied any lymphadenopathy, fevers, chills or weight loss. She remains active and attends to activities of daily living.  She does not report any headaches, blurry vision, syncope or seizures. She does not report any fevers, chills, sweats or weight loss. She does not report any chest pain, palpitation, orthopnea or leg edema. She does not report any cough, wheezing or hemoptysis. She is not report any nausea, vomiting or abdominal pain. She does not report any frequency urgency or hesitancy. She does not report any skeletal complaints. Remaining review of systems unremarkable.   Past Medical History:  Diagnosis Date  . ADD (attention deficit disorder)   . Dyslipidemia   . Endometriosis   . Hematuria    h/o asymptomatic  . HTN (hypertension)   :  Past Surgical History:  Procedure Laterality Date  . LAPAROSCOPY     endometriosis  :   Current Outpatient Prescriptions:  .  CALCIUM PO, Take 1  tablet by mouth daily. PATIENT NOT SURE OF DOSAGE, Disp: , Rfl:  .  Cetirizine HCl (ZYRTEC PO), Take 1 tablet by mouth daily. , Disp: , Rfl:  .  estradiol (VIVELLE-DOT) 0.0375 MG/24HR, Place 1 patch onto the skin 2 (two) times a week., Disp: 24 patch, Rfl: 0 .  FLUoxetine (PROZAC) 20 MG tablet, Take 1 tablet (20 mg total) by mouth daily., Disp: 90 tablet, Rfl: 4 .  fluticasone (FLONASE) 50 MCG/ACT nasal spray, Place 1 spray into both nostrils daily as needed for allergies. , Disp: , Rfl: 0 .  metoprolol tartrate (LOPRESSOR) 25 MG tablet, Take 0.5 tablets (12.5 mg total) by mouth 2 (two) times daily., Disp: 60 tablet, Rfl: 5 .  montelukast (SINGULAIR) 10 MG tablet, Take 10 mg by mouth daily as needed (for allergies). , Disp: , Rfl:  .  progesterone (PROMETRIUM) 100 MG capsule, Take 1 capsule (100 mg total) by mouth daily., Disp: 90 capsule, Rfl: 0 .  telmisartan-hydrochlorothiazide (MICARDIS HCT) 80-25 MG tablet, TAKE 1 TABLET BY MOUTH EVERY DAY, Disp: 30 tablet, Rfl: 10:  Allergies  Allergen Reactions  . Codeine Other (See Comments)    Fainted   . Latex Itching and Other (See Comments)    sensitivity  :  Family History  Problem Relation Age of Onset  . Heart failure Mother   . Diabetes Father   . Other Brother        bilateral bladder  . Colon cancer Neg Hx   :  Social History   Social History  . Marital status: Married    Spouse name: N/A  . Number of children:  3  . Years of education: N/A   Occupational History  . Journalist, newspaper    Social History Main Topics  . Smoking status: Never Smoker  . Smokeless tobacco: Never Used  . Alcohol use 1.8 oz/week    3 Standard drinks or equivalent per week  . Drug use: No  . Sexual activity: Yes    Partners: Male    Birth control/ protection: Post-menopausal     Comment: vasectomy   Other Topics Concern  . Not on file   Social History Narrative  . No narrative on file  :  Pertinent items are noted in HPI.  Exam: Blood  pressure 138/76, pulse 72, temperature 98.1 F (36.7 C), temperature source Oral, resp. rate 19, height 5\' 3"  (1.6 m), weight 159 lb 14.4 oz (72.5 kg), SpO2 98 %. General appearance: alert and cooperative appeared without distress. Throat: No oral thrush or ulcers. Neck: no adenopathy Back: negative Chest wall: no tenderness Cardio: regular rate and rhythm, S1, S2 normal, no murmur, click, rub or gallop GI: soft, non-tender; bowel sounds normal; no masses,  no organomegaly Extremities: extremities normal, atraumatic, no cyanosis or edema Skin: Skin color, texture, turgor normal. No rashes or lesions Lymphatic examination did not reveal any lymphadenopathy. No inguinal, supraclavicular or axillary.   CBC    Component Value Date/Time   WBC 12.3 (H) 04/16/2016 1002   RBC 4.36 04/16/2016 1002   HGB 14.3 04/16/2016 1002   HGB 14.8 05/31/2014 1649   HCT 42.2 04/16/2016 1002   PLT 224 04/16/2016 1002   MCV 96.8 04/16/2016 1002   MCH 32.8 04/16/2016 1002   MCHC 33.9 04/16/2016 1002   RDW 14.4 04/16/2016 1002     Assessment and Plan:   66 year old woman with the following issues:  1. Her leukocytosis with lymphocytosis. This was detected in April 2018. Her white cell count was 19,000 with a lymphocyte percentage of 67%. She had elevated white cell count in 2017 fluctuating between 17 and 12.3.   The differential diagnosis was discussed today with the patient. This could be related to a lymphoproliferative disorder such as CLL, mantle cell lymphoma among others. Reactive leukocytosis and lymphocytosis possibility but considered less likely. She does have seasonal allergies that can cause fluctuating leukocytosis but it is uncommon to affected lymphocyte count.  To determine the etiology of her lymphocytosis elect obtained for cytometry testing today. Risks and benefits of obtaining this testing was discussed today with the patient. The benefit would be obtaining a definitive diagnosis at  this time with very little down side or side effects.  The natural course of CLL was discussed with the patient today in case that is a definitive diagnosis. She understand that this is a chronic illness and will not likely need any treatment in the immediate future. Observation and surveillance would be warranted given the mild leukocytosis as well as no symptoms noted. She does understand that she will require frequent monitoring in education about signs and symptoms that warrant treatment. These would include fevers, chills, weight loss, painful adenopathy, cytopenias, recurrent infections as well as rapid rise in her white cell counts.  After discussion today, she is agreeable to have the test done and I will communicate these results to her as soon as the results are available.  2. Follow-up: Will be in 6 months to repeat laboratory testing and clinical monitoring. This will not be necessary as CLL has been ruled out.

## 2017-02-21 LAB — FLOW CYTOMETRY

## 2017-03-03 ENCOUNTER — Other Ambulatory Visit: Payer: Self-pay | Admitting: Obstetrics & Gynecology

## 2017-03-04 NOTE — Telephone Encounter (Signed)
Please call pt.  Her last MMG was 10/16.  She needs to have her mammogram done.  I reminded her of this at her AEX.  #30/0RF has been done.

## 2017-03-04 NOTE — Telephone Encounter (Signed)
Medication refill request: Progesterone Last AEX:  10/28/16 SM Next AEX: 01/27/18 SM Last MMG (if hormonal medication request): 07/08/15 [facility] Refill authorized: 10/28/16 #[id number]. Please advise. Thank you.

## 2017-03-07 NOTE — Telephone Encounter (Signed)
Spoke with patient and advised her that she needs to have her MMG done before anymore refills can be sent in. Patient verbalized understanding and agreement.

## 2017-03-15 ENCOUNTER — Telehealth: Payer: Self-pay

## 2017-03-15 NOTE — Telephone Encounter (Signed)
Megan Salon, MD  [facility] Triage Pool        Can you please call and get MMG scheduled for this pt? On HRT and has not done MMG yet. Thanks.    Spoke with patient regarding scheduling screening mammogram. Offered to schedule this appointment for the patient. Patient declines assistance in scheduling. Reports she received a call from the [facility] that she is due for her screening and will call to schedule at this time. Aware she will not received further refills of HRT without having her mammogram.  Routing to provider for final review. Patient agreeable to disposition. Will close encounter.

## 2017-03-17 ENCOUNTER — Other Ambulatory Visit: Payer: Self-pay | Admitting: Obstetrics & Gynecology

## 2017-03-17 DIAGNOSIS — Z1231 Encounter for screening mammogram for malignant neoplasm of breast: Secondary | ICD-10-CM

## 2017-03-30 ENCOUNTER — Encounter: Payer: Self-pay | Admitting: Radiology

## 2017-03-30 ENCOUNTER — Ambulatory Visit
Admission: RE | Admit: 2017-03-30 | Discharge: 2017-03-30 | Disposition: A | Discharge status: Home / Self Care | Payer: 59 | Source: Ambulatory Visit | Attending: Obstetrics & Gynecology | Admitting: Obstetrics & Gynecology

## 2017-03-30 DIAGNOSIS — Z1231 Encounter for screening mammogram for malignant neoplasm of breast: Secondary | ICD-10-CM

## 2017-03-31 ENCOUNTER — Other Ambulatory Visit: Payer: Self-pay | Admitting: Obstetrics & Gynecology

## 2017-03-31 DIAGNOSIS — R928 Other abnormal and inconclusive findings on diagnostic imaging of breast: Secondary | ICD-10-CM

## 2017-04-05 ENCOUNTER — Other Ambulatory Visit: Payer: Self-pay | Admitting: Obstetrics & Gynecology

## 2017-04-05 ENCOUNTER — Ambulatory Visit
Admission: RE | Admit: 2017-04-05 | Discharge: 2017-04-05 | Disposition: A | Discharge status: Home / Self Care | Payer: 59 | Source: Ambulatory Visit | Attending: Obstetrics & Gynecology | Admitting: Obstetrics & Gynecology

## 2017-04-05 DIAGNOSIS — R928 Other abnormal and inconclusive findings on diagnostic imaging of breast: Secondary | ICD-10-CM

## 2017-04-05 DIAGNOSIS — R599 Enlarged lymph nodes, unspecified: Secondary | ICD-10-CM

## 2017-04-05 DIAGNOSIS — N6489 Other specified disorders of breast: Secondary | ICD-10-CM

## 2017-04-08 ENCOUNTER — Ambulatory Visit
Admission: RE | Admit: 2017-04-08 | Discharge: 2017-04-08 | Disposition: A | Discharge status: Home / Self Care | Payer: 59 | Source: Ambulatory Visit | Attending: Obstetrics & Gynecology | Admitting: Obstetrics & Gynecology

## 2017-04-08 ENCOUNTER — Other Ambulatory Visit: Payer: Self-pay | Admitting: Obstetrics & Gynecology

## 2017-04-08 DIAGNOSIS — R599 Enlarged lymph nodes, unspecified: Secondary | ICD-10-CM | POA: Diagnosis not present

## 2017-04-08 DIAGNOSIS — N6489 Other specified disorders of breast: Secondary | ICD-10-CM

## 2017-04-11 ENCOUNTER — Other Ambulatory Visit (HOSPITAL_COMMUNITY)
Admission: RE | Admit: 2017-04-11 | Discharge: 2017-04-11 | Disposition: A | Discharge status: Home / Self Care | Payer: 59 | Source: Ambulatory Visit | Attending: Diagnostic Radiology | Admitting: Diagnostic Radiology

## 2017-05-04 ENCOUNTER — Telehealth: Payer: Self-pay | Admitting: Oncology

## 2017-05-04 NOTE — Telephone Encounter (Signed)
Called patient's home number to notify her of a new appt that has been scheduled. Left a message.       [facility] 05/04/2017.

## 2017-05-10 ENCOUNTER — Other Ambulatory Visit (HOSPITAL_BASED_OUTPATIENT_CLINIC_OR_DEPARTMENT_OTHER): Payer: 59

## 2017-05-10 ENCOUNTER — Ambulatory Visit (HOSPITAL_BASED_OUTPATIENT_CLINIC_OR_DEPARTMENT_OTHER): Payer: 59 | Admitting: Oncology

## 2017-05-10 ENCOUNTER — Other Ambulatory Visit: Payer: Self-pay | Admitting: *Deleted

## 2017-05-10 VITALS — BP 142/83 | HR 77 | Temp 98.9°F | Resp 18 | Ht 63.0 in | Wt 160.2 lb

## 2017-05-10 DIAGNOSIS — C9110 Chronic lymphocytic leukemia of B-cell type not having achieved remission: Secondary | ICD-10-CM

## 2017-05-10 DIAGNOSIS — N6489 Other specified disorders of breast: Secondary | ICD-10-CM

## 2017-05-10 DIAGNOSIS — D72829 Elevated white blood cell count, unspecified: Secondary | ICD-10-CM

## 2017-05-10 LAB — CBC WITH DIFFERENTIAL/PLATELET
BASO%: 0.3 % (ref 0.0–2.0)
Basophils Absolute: 0.1 10*3/uL (ref 0.0–0.1)
EOS%: 0.5 % (ref 0.0–7.0)
Eosinophils Absolute: 0.1 10*3/uL (ref 0.0–0.5)
HCT: 40.6 % (ref 34.8–46.6)
HGB: 13.7 g/dL (ref 11.6–15.9)
LYMPH%: 75.7 % — ABNORMAL HIGH (ref 14.0–49.7)
MCH: 32.9 pg (ref 25.1–34.0)
MCHC: 33.8 g/dL (ref 31.5–36.0)
MCV: 97.3 fL (ref 79.5–101.0)
MONO#: 0.6 10*3/uL (ref 0.1–0.9)
MONO%: 3.1 % (ref 0.0–14.0)
NEUT#: 4.1 10*3/uL (ref 1.5–6.5)
NEUT%: 20.4 % — ABNORMAL LOW (ref 38.4–76.8)
Platelets: 226 10*3/uL (ref 145–400)
RBC: 4.17 10*6/uL (ref 3.70–5.45)
RDW: 14 % (ref 11.2–14.5)
WBC: 20.2 10*3/uL — ABNORMAL HIGH (ref 3.9–10.3)
lymph#: 15.3 10*3/uL — ABNORMAL HIGH (ref 0.9–3.3)

## 2017-05-10 LAB — COMPREHENSIVE METABOLIC PANEL
ALT: 15 U/L (ref 0–55)
AST: 22 U/L (ref 5–34)
Albumin: 4.2 g/dL (ref 3.5–5.0)
Alkaline Phosphatase: 93 U/L (ref 40–150)
Anion Gap: 10 mEq/L (ref 3–11)
BUN: 17.9 mg/dL (ref 7.0–26.0)
CO2: 30 mEq/L — ABNORMAL HIGH (ref 22–29)
Calcium: 9.8 mg/dL (ref 8.4–10.4)
Chloride: 101 mEq/L (ref 98–109)
Creatinine: 0.8 mg/dL (ref 0.6–1.1)
EGFR: 78 mL/min/{1.73_m2} — ABNORMAL LOW (ref >90)
Glucose: 99 mg/dl (ref 70–140)
Potassium: 3.9 mEq/L (ref 3.5–5.1)
Sodium: 141 mEq/L (ref 136–145)
Total Bilirubin: 0.6 mg/dL (ref 0.20–1.20)
Total Protein: 7.1 g/dL (ref 6.4–8.3)

## 2017-05-10 LAB — TECHNOLOGIST REVIEW

## 2017-05-10 LAB — LACTATE DEHYDROGENASE: LDH: 231 U/L (ref 125–245)

## 2017-05-10 NOTE — Progress Notes (Signed)
Hematology and Oncology Follow Up Visit  Alice Pollard [id number] [birth year] 66 y.o. 05/10/2017 12:13 PM Patient, No Pcp PerNo ref. 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 She presented with leukocytosis with lymphocytosis and small lymphadenopathy.  Current therapy: Observation and surveillance.  Interim History: Alice Pollard presents today for a follow-up visit. Since the last visit, she underwent a mammogram which showed an abnormalities in her left breast in the lower outer quadrant as well as a regional lymphadenopathy. 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 The breast lesion suggestive of a complex sclerosing lesion. She was evaluated by Dr. Donne Hazel for possible removal of that lesion but for the time being nothing is scheduled. Per her report, she reports that she had this sclerosing lesion for many years. Clinically she has no symptoms at this time. She denied any fevers, weight loss or painful adenopathy. She continues to be active and planning a trip to [place] to play golf.  She does not report any headaches, blurry vision, syncope or seizures. She does not report any fevers, chills, sweats or weight loss. She does not report any chest pain, palpitation, orthopnea or leg edema. She does not report any cough, wheezing or hemoptysis. She is not report any nausea, vomiting or abdominal pain. She does not report any frequency urgency or hesitancy. She does not report any skeletal complaints. Remaining review of systems unremarkable.   Medications: I have reviewed the patient's current medications.  Current Outpatient Prescriptions  Medication Sig Dispense Refill  . CALCIUM PO Take 1 tablet by mouth daily. PATIENT NOT SURE OF DOSAGE    . Cetirizine HCl (ZYRTEC PO) Take 1 tablet by mouth daily.     Marland Kitchen estradiol (VIVELLE-DOT) 0.0375 MG/24HR Place 1 patch onto the skin 2  (two) times a week. 24 patch 0  . FLUoxetine (PROZAC) 20 MG tablet Take 1 tablet (20 mg total) by mouth daily. 90 tablet 4  . fluticasone (FLONASE) 50 MCG/ACT nasal spray Place 1 spray into both nostrils daily as needed for allergies.   0  . metoprolol tartrate (LOPRESSOR) 25 MG tablet Take 0.5 tablets (12.5 mg total) by mouth 2 (two) times daily. 60 tablet 5  . montelukast (SINGULAIR) 10 MG tablet Take 10 mg by mouth daily as needed (for allergies).     . progesterone (PROMETRIUM) 100 MG capsule TAKE 1 CAPSULE(100 MG) BY MOUTH DAILY 30 capsule 0  . telmisartan-hydrochlorothiazide (MICARDIS HCT) 80-25 MG tablet TAKE 1 TABLET BY MOUTH EVERY DAY 30 tablet 10   No current facility-administered medications for this visit.      Allergies:  Allergies  Allergen Reactions  . Codeine Other (See Comments)    Fainted   . Latex Itching and Other (See Comments)    sensitivity    Past Medical History, Surgical history, Social history, and Family History were reviewed and updated.  Physical Exam: Blood pressure (!) 142/83, pulse 77, temperature 98.9 F (37.2 C), temperature source Oral, resp. rate 18, height 5\' 3"  (1.6 m), weight 160 lb 3.2 oz (72.7 kg), last menstrual period 08/05/2007, SpO2 100 %. ECOG: 0 General appearance: alert and cooperative Head: Normocephalic, without obvious abnormality Neck: no adenopathy Lymph nodes: Cervical, supraclavicular, and axillary nodes normal. Heart:regular rate and rhythm, S1, S2 normal, no murmur, click, rub or gallop Lung:chest clear, no wheezing, rales, normal symmetric air entry Abdomin: soft, non-tender, without masses or organomegaly EXT:no erythema, induration,  or nodules   Lab Results: Lab Results  Component Value Date   WBC 20.2 (H) 05/10/2017   HGB 13.7 05/10/2017   HCT 40.6 05/10/2017   MCV 97.3 05/10/2017   PLT 226 05/10/2017     Chemistry      Component Value Date/Time   NA 143 03/29/2016 1448   K 3.7 03/29/2016 1448   CL 102  03/29/2016 1448   CO2 27 03/29/2016 1448   BUN 14 03/29/2016 1448   CREATININE 0.65 03/29/2016 1448      Component Value Date/Time   CALCIUM 9.5 03/29/2016 1448   ALKPHOS 79 03/29/2016 1448   AST 19 03/29/2016 1448   ALT 12 03/29/2016 1448   BILITOT 0.7 03/29/2016 1448       Impression and Plan:  66 year old woman with the following issues:  1. CLL diagnosed in May 2018. She was found to have leukocytosis with white cell count of 19,000 and lymphocytosis. Her flow cytometry on 02/18/2017 confirmed the presence of CLL. She also underwent a ultrasound biopsy of a left axillary lymph glands that confirmed the presence of CLL.  The natural course of this disease was discussed again. Given her low volume disease as well as no symptoms we have recommended continued observation and surveillance. We have discussed the indication for treatment this time which includes symptomatic adenopathy, constitutional symptoms or cytopenias. For the time being she has not had these complaints I will continue to follow her closely.  2. 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 surgery regarding this issue. Primary surgical therapy has been considered but for the time being she might decide on observation.  3. Autoimmune cytopenias: No evidence of autoimmune disease noted.  4. Opportunistic infections: I educated her possibility of developing immune dysregulation and risk of infection. He has no recent infections noted.  5. Follow-up: Will be in 3 months to assess her clinical status.   Zola Button, MD 8/7/201812:13 PM

## 2017-05-23 ENCOUNTER — Other Ambulatory Visit: Payer: Self-pay | Admitting: *Deleted

## 2017-05-23 MED ORDER — METOPROLOL TARTRATE 25 MG PO TABS: 12.5000 mg | ORAL_TABLET | Freq: Two times a day (BID) | ORAL | 5 refills | Status: DC

## 2017-05-23 MED ORDER — TELMISARTAN-HCTZ 80-25 MG PO TABS: 1.0000 | ORAL_TABLET | Freq: Every day | ORAL | 10 refills | Status: DC

## 2017-07-18 DIAGNOSIS — C9110 Chronic lymphocytic leukemia of B-cell type not having achieved remission: Secondary | ICD-10-CM | POA: Insufficient documentation

## 2017-07-29 ENCOUNTER — Other Ambulatory Visit: Payer: Self-pay | Admitting: General Surgery

## 2017-07-29 DIAGNOSIS — N6489 Other specified disorders of breast: Secondary | ICD-10-CM

## 2017-08-01 ENCOUNTER — Other Ambulatory Visit: Payer: Self-pay | Admitting: General Surgery

## 2017-08-08 ENCOUNTER — Other Ambulatory Visit: Payer: Self-pay | Admitting: General Surgery

## 2017-08-08 DIAGNOSIS — N6489 Other specified disorders of breast: Secondary | ICD-10-CM

## 2017-08-19 ENCOUNTER — Other Ambulatory Visit (HOSPITAL_BASED_OUTPATIENT_CLINIC_OR_DEPARTMENT_OTHER): Payer: 59

## 2017-08-19 ENCOUNTER — Inpatient Hospital Stay: Payer: 59 | Attending: Oncology | Admitting: Oncology

## 2017-08-19 VITALS — BP 146/98 | HR 79 | Temp 98.1°F | Resp 18 | Ht 63.0 in | Wt 161.8 lb

## 2017-08-19 DIAGNOSIS — D72829 Elevated white blood cell count, unspecified: Secondary | ICD-10-CM

## 2017-08-19 DIAGNOSIS — C9110 Chronic lymphocytic leukemia of B-cell type not having achieved remission: Secondary | ICD-10-CM

## 2017-08-19 DIAGNOSIS — N649 Disorder of breast, unspecified: Secondary | ICD-10-CM | POA: Diagnosis not present

## 2017-08-19 LAB — CBC WITH DIFFERENTIAL/PLATELET
BASO%: 0.4 % (ref 0.0–2.0)
Basophils Absolute: 0.1 10*3/uL (ref 0.0–0.1)
EOS%: 0.4 % (ref 0.0–7.0)
Eosinophils Absolute: 0.1 10*3/uL (ref 0.0–0.5)
HCT: 40.6 % (ref 34.8–46.6)
HGB: 13.7 g/dL (ref 11.6–15.9)
LYMPH%: 71.3 % — ABNORMAL HIGH (ref 14.0–49.7)
MCH: 32.4 pg (ref 25.1–34.0)
MCHC: 33.8 g/dL (ref 31.5–36.0)
MCV: 95.9 fL (ref 79.5–101.0)
MONO#: 0.7 10*3/uL (ref 0.1–0.9)
MONO%: 3 % (ref 0.0–14.0)
NEUT#: 5.5 10*3/uL (ref 1.5–6.5)
NEUT%: 24.9 % — ABNORMAL LOW (ref 38.4–76.8)
Platelets: 191 10*3/uL (ref 145–400)
RBC: 4.24 10*6/uL (ref 3.70–5.45)
RDW: 13.4 % (ref 11.2–14.5)
WBC: 22.1 10*3/uL — ABNORMAL HIGH (ref 3.9–10.3)
lymph#: 15.8 10*3/uL — ABNORMAL HIGH (ref 0.9–3.3)

## 2017-08-19 LAB — COMPREHENSIVE METABOLIC PANEL
ALT: 14 U/L (ref 0–55)
AST: 20 U/L (ref 5–34)
Albumin: 4.2 g/dL (ref 3.5–5.0)
Alkaline Phosphatase: 89 U/L (ref 40–150)
Anion Gap: 9 mEq/L (ref 3–11)
BUN: 19.9 mg/dL (ref 7.0–26.0)
CO2: 29 mEq/L (ref 22–29)
Calcium: 9.9 mg/dL (ref 8.4–10.4)
Chloride: 104 mEq/L (ref 98–109)
Creatinine: 0.8 mg/dL (ref 0.6–1.1)
EGFR: >60 mL/min/{1.73_m2} (ref >60)
Glucose: 98 mg/dl (ref 70–140)
Potassium: 3.8 mEq/L (ref 3.5–5.1)
Sodium: 142 mEq/L (ref 136–145)
Total Bilirubin: 0.6 mg/dL (ref 0.20–1.20)
Total Protein: 7.4 g/dL (ref 6.4–8.3)

## 2017-08-19 LAB — TECHNOLOGIST REVIEW

## 2017-08-19 LAB — LACTATE DEHYDROGENASE: LDH: 251 U/L — ABNORMAL HIGH (ref 125–245)

## 2017-08-19 NOTE — Progress Notes (Signed)
Hematology and Oncology Follow Up Visit  Alice Pollard [id number] September 11, 1951 66 y.o. 08/19/2017 2:44 PM Patient, No Pcp PerNo ref. provider found   Principle Diagnosis: 66 year old woman with:  1. CLL in May 2018. She presented with leukocytosis with lymphocytosis and small lymphadenopathy.  2. 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  She is scheduled to have surgical removal in December 2018.  Current therapy: Observation and surveillance.  Interim History: Alice Pollard presents today for a follow-up visit. Since the last visit, she reports feeling well. She denied any fevers, weight loss or painful adenopathy. She she reports no breast masses or lesions.  She continues to be active and attends activities.  She denies any weight loss or appetite changes.  She denies any constitutional symptoms.  She does not report any headaches, blurry vision, syncope or seizures. She does not report any fevers, chills, sweats or weight loss. She does not report any chest pain, palpitation, orthopnea or leg edema. She does not report any cough, wheezing or hemoptysis. She is not report any nausea, vomiting or abdominal pain. She does not report any frequency urgency or hesitancy. She does not report any skeletal complaints. Remaining review of systems unremarkable.   Medications: I have reviewed the patient's current medications.  Current Outpatient Medications  Medication Sig Dispense Refill  . CALCIUM PO Take 1 tablet by mouth daily. PATIENT NOT SURE OF DOSAGE    . Cetirizine HCl (ZYRTEC PO) Take 1 tablet by mouth daily.     Marland Kitchen estradiol (VIVELLE-DOT) 0.0375 MG/24HR Place 1 patch onto the skin 2 (two) times a week. 24 patch 0  . FLUoxetine (PROZAC) 20 MG tablet Take 1 tablet (20 mg total) by mouth daily. 90 tablet 4  . fluticasone (FLONASE) 50 MCG/ACT nasal spray Place 1 spray into both nostrils daily as needed for allergies.   0  . metoprolol tartrate (LOPRESSOR) 25 MG tablet Take 0.5  tablets (12.5 mg total) by mouth 2 (two) times daily. 60 tablet 5  . montelukast (SINGULAIR) 10 MG tablet Take 10 mg by mouth daily as needed (for allergies).     . progesterone (PROMETRIUM) 100 MG capsule TAKE 1 CAPSULE(100 MG) BY MOUTH DAILY 30 capsule 0  . telmisartan-hydrochlorothiazide (MICARDIS HCT) 80-25 MG tablet Take 1 tablet by mouth daily. 30 tablet 10   No current facility-administered medications for this visit.      Allergies:  Allergies  Allergen Reactions  . Codeine Other (See Comments)    Fainted   . Latex Itching and Other (See Comments)    sensitivity    Past Medical History, Surgical history, Social history, and Family History were reviewed and updated.  Physical Exam: Blood pressure (!) 146/98, pulse 79, temperature 98.1 F (36.7 C), temperature source Oral, resp. rate 18, height 5\' 3"  (1.6 m), weight 161 lb 12.8 oz (73.4 kg), last menstrual period 08/05/2007, SpO2 100 %. ECOG: 0 General appearance: alert and cooperative appeared without distress. Head: Normocephalic, without obvious abnormality ulcers or lesions. Neck: no adenopathy Lymph nodes: Cervical, supraclavicular, and axillary nodes normal. Heart:regular rate and rhythm, S1, S2 normal, no murmur, click, rub or gallop Lung:chest clear, no wheezing, rales, normal symmetric air entry Abdomin: soft, non-tender, without masses or organomegaly no rebound or guarding. EXT:no erythema, induration, or nodules   Lab Results: Lab Results  Component Value Date   WBC 22.1 (H) 08/19/2017   HGB 13.7 08/19/2017   HCT 40.6 08/19/2017   MCV 95.9 08/19/2017   PLT 191 08/19/2017  Chemistry      Component Value Date/Time   NA 141 05/10/2017 1153   K 3.9 05/10/2017 1153   CL 102 03/29/2016 1448   CO2 30 (H) 05/10/2017 1153   BUN 17.9 05/10/2017 1153   CREATININE 0.8 05/10/2017 1153      Component Value Date/Time   CALCIUM 9.8 05/10/2017 1153   ALKPHOS 93 05/10/2017 1153   AST 22 05/10/2017 1153    ALT 15 05/10/2017 1153   BILITOT 0.60 05/10/2017 1153       Impression and Plan:  66 year old woman with the following issues:  1. CLL diagnosed in May 2018. She was found to have leukocytosis with white cell count of 19,000 and lymphocytosis. Her flow cytometry on 02/18/2017 confirmed the presence of CLL. She also underwent a ultrasound biopsy of a left axillary lymph glands that confirmed the presence of CLL.  She remains asymptomatic at this time without any indication for treatment.  The natural course of this disease was reviewed and indication for treatment were discussed.  The plan is to continue to monitor her every 4 months for.  2. 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 surgery regarding this issue. Primary surgical therapy is scheduled for December 2018.  3. Autoimmune cytopenias: No evidence of autoimmune disease noted.  4. Opportunistic infections: I educated her possibility of developing immune dysregulation and risk of infection. He has no recent infections noted.  5. Follow-up: Will be in 4 months to assess her clinical status.   Alice Button, MD 11/16/20182:44 PM

## 2017-09-07 ENCOUNTER — Encounter (HOSPITAL_BASED_OUTPATIENT_CLINIC_OR_DEPARTMENT_OTHER): Payer: Self-pay | Admitting: *Deleted

## 2017-09-08 ENCOUNTER — Ambulatory Visit
Admission: RE | Admit: 2017-09-08 | Discharge: 2017-09-08 | Disposition: A | Discharge status: Home / Self Care | Payer: 59 | Source: Ambulatory Visit | Attending: General Surgery | Admitting: General Surgery

## 2017-09-08 ENCOUNTER — Encounter (HOSPITAL_BASED_OUTPATIENT_CLINIC_OR_DEPARTMENT_OTHER)
Admission: RE | Admit: 2017-09-08 | Discharge: 2017-09-08 | Disposition: A | Discharge status: Home / Self Care | Payer: 59 | Source: Ambulatory Visit | Attending: General Surgery | Admitting: General Surgery

## 2017-09-08 DIAGNOSIS — I10 Essential (primary) hypertension: Secondary | ICD-10-CM | POA: Diagnosis not present

## 2017-09-08 DIAGNOSIS — N6489 Other specified disorders of breast: Secondary | ICD-10-CM

## 2017-09-08 NOTE — Pre-Procedure Instructions (Signed)
Patient in for appointment.  Pre surgery Ensure given with instructions to complete by 0400 DOS. Also CHG solution given with instructions to shower night before and morning of surgery.

## 2017-09-12 ENCOUNTER — Encounter (HOSPITAL_BASED_OUTPATIENT_CLINIC_OR_DEPARTMENT_OTHER): Payer: Self-pay

## 2017-09-12 ENCOUNTER — Inpatient Hospital Stay: Admission: RE | Admit: 2017-09-12 | Payer: 59 | Source: Ambulatory Visit

## 2017-09-12 NOTE — Anesthesia Preprocedure Evaluation (Addendum)
Anesthesia Evaluation  Patient identified by MRN, date of birth, ID band Patient awake    Reviewed: Allergy & Precautions, NPO status , Patient's Chart, lab work & pertinent test results  History of Anesthesia Complications (+) PONV and history of anesthetic complications  Airway Mallampati: I  TM Distance: >3 FB Neck ROM: Full    Dental no notable dental hx. (+) Teeth Intact, Dental Advisory Given   Pulmonary neg pulmonary ROS,    Pulmonary exam normal breath sounds clear to auscultation       Cardiovascular hypertension, Pt. on medications and Pt. on home beta blockers Normal cardiovascular exam Rhythm:Regular Rate:Normal     Neuro/Psych PSYCHIATRIC DISORDERS negative neurological ROS     GI/Hepatic negative GI ROS, Neg liver ROS,   Endo/Other  negative endocrine ROS  Renal/GU negative Renal ROS     Musculoskeletal negative musculoskeletal ROS (+)   Abdominal Normal abdominal exam  (+)   Peds  Hematology negative hematology ROS (+)   Anesthesia Other Findings - HLD  Reproductive/Obstetrics                           Lab Results  Component Value Date   WBC 22.1 (H) 08/19/2017   HGB 13.7 08/19/2017   HCT 40.6 08/19/2017   MCV 95.9 08/19/2017   PLT 191 08/19/2017   EKG: normal sinus rhythm.  Anesthesia Physical Anesthesia Plan  ASA: II  Anesthesia Plan: General   Post-op Pain Management:    Induction: Intravenous  PONV Risk Score and Plan: 3 and Ondansetron, Dexamethasone, Treatment may vary due to age or medical condition, Midazolam and Scopolamine patch - Pre-op  Airway Management Planned: LMA  Additional Equipment:   Intra-op Plan:   Post-operative Plan: Extubation in OR  Informed Consent: I have reviewed the patients History and Physical, chart, labs and discussed the procedure including the risks, benefits and alternatives for the proposed anesthesia with the  patient or authorized representative who has indicated his/her understanding and acceptance.     Plan Discussed with: CRNA, Surgeon and Anesthesiologist  Anesthesia Plan Comments: ( )        Anesthesia Quick Evaluation

## 2017-09-13 ENCOUNTER — Ambulatory Visit
Admission: RE | Admit: 2017-09-13 | Discharge: 2017-09-13 | Disposition: A | Discharge status: Home / Self Care | Payer: 59 | Source: Ambulatory Visit | Attending: General Surgery | Admitting: General Surgery

## 2017-09-13 ENCOUNTER — Ambulatory Visit (HOSPITAL_BASED_OUTPATIENT_CLINIC_OR_DEPARTMENT_OTHER): Payer: 59 | Admitting: Anesthesiology

## 2017-09-13 ENCOUNTER — Encounter (HOSPITAL_BASED_OUTPATIENT_CLINIC_OR_DEPARTMENT_OTHER): Payer: Self-pay | Admitting: *Deleted

## 2017-09-13 ENCOUNTER — Encounter (HOSPITAL_BASED_OUTPATIENT_CLINIC_OR_DEPARTMENT_OTHER)
Admission: RE | Disposition: A | Discharge status: Home / Self Care | Payer: Self-pay | Source: Ambulatory Visit | Attending: General Surgery

## 2017-09-13 ENCOUNTER — Ambulatory Visit (HOSPITAL_BASED_OUTPATIENT_CLINIC_OR_DEPARTMENT_OTHER)
Admission: RE | Admit: 2017-09-13 | Discharge: 2017-09-13 | Disposition: A | Discharge status: Home / Self Care | Payer: 59 | Source: Ambulatory Visit | Attending: General Surgery | Admitting: General Surgery

## 2017-09-13 DIAGNOSIS — C9110 Chronic lymphocytic leukemia of B-cell type not having achieved remission: Secondary | ICD-10-CM | POA: Diagnosis not present

## 2017-09-13 DIAGNOSIS — N6489 Other specified disorders of breast: Secondary | ICD-10-CM

## 2017-09-13 DIAGNOSIS — Z79899 Other long term (current) drug therapy: Secondary | ICD-10-CM | POA: Diagnosis not present

## 2017-09-13 DIAGNOSIS — E7800 Pure hypercholesterolemia, unspecified: Secondary | ICD-10-CM | POA: Insufficient documentation

## 2017-09-13 DIAGNOSIS — N6022 Fibroadenosis of left breast: Secondary | ICD-10-CM | POA: Insufficient documentation

## 2017-09-13 DIAGNOSIS — I10 Essential (primary) hypertension: Secondary | ICD-10-CM | POA: Diagnosis not present

## 2017-09-13 HISTORY — PX: RADIOACTIVE SEED GUIDED EXCISIONAL BREAST BIOPSY: SHX6490

## 2017-09-13 HISTORY — DX: Other specified postprocedural states: Z98.890

## 2017-09-13 HISTORY — DX: Nausea with vomiting, unspecified: R11.2

## 2017-09-13 SURGERY — RADIOACTIVE SEED GUIDED BREAST BIOPSY
Anesthesia: General | Site: Breast | Laterality: Left

## 2017-09-13 MED ORDER — FENTANYL CITRATE (PF) 100 MCG/2ML IJ SOLN
INTRAMUSCULAR | Status: AC
  Filled 2017-09-13: qty 4

## 2017-09-13 MED ORDER — PHENYLEPHRINE 40 MCG/ML (10ML) SYRINGE FOR IV PUSH (FOR BLOOD PRESSURE SUPPORT)
PREFILLED_SYRINGE | INTRAVENOUS | Status: AC
  Filled 2017-09-13: qty 10

## 2017-09-13 MED ORDER — PROPOFOL 10 MG/ML IV BOLUS
INTRAVENOUS | Status: DC | PRN
  Administered 2017-09-13: 200 mg via INTRAVENOUS

## 2017-09-13 MED ORDER — ONDANSETRON HCL 4 MG/2ML IJ SOLN: 4.0000 mg | Freq: Once | INTRAMUSCULAR | Status: DC | PRN

## 2017-09-13 MED ORDER — ACETAMINOPHEN 500 MG PO TABS
ORAL_TABLET | ORAL | Status: AC
  Filled 2017-09-13: qty 2

## 2017-09-13 MED ORDER — MEPERIDINE HCL 25 MG/ML IJ SOLN: 6.2500 mg | INTRAMUSCULAR | Status: DC | PRN

## 2017-09-13 MED ORDER — DEXAMETHASONE SODIUM PHOSPHATE 10 MG/ML IJ SOLN
INTRAMUSCULAR | Status: DC | PRN
  Administered 2017-09-13: 10 mg via INTRAVENOUS

## 2017-09-13 MED ORDER — ONDANSETRON HCL 4 MG/2ML IJ SOLN
INTRAMUSCULAR | Status: AC
  Filled 2017-09-13: qty 2

## 2017-09-13 MED ORDER — FENTANYL CITRATE (PF) 100 MCG/2ML IJ SOLN
INTRAMUSCULAR | Status: DC | PRN
  Administered 2017-09-13 (×3): 50 ug via INTRAVENOUS

## 2017-09-13 MED ORDER — MIDAZOLAM HCL 2 MG/2ML IJ SOLN
INTRAMUSCULAR | Status: AC
  Filled 2017-09-13: qty 2

## 2017-09-13 MED ORDER — CEFAZOLIN SODIUM-DEXTROSE 2-4 GM/100ML-% IV SOLN
INTRAVENOUS | Status: AC
  Filled 2017-09-13: qty 100

## 2017-09-13 MED ORDER — GABAPENTIN 300 MG PO CAPS
ORAL_CAPSULE | ORAL | Status: AC
  Filled 2017-09-13: qty 1

## 2017-09-13 MED ORDER — GABAPENTIN 300 MG PO CAPS
300.0000 mg | ORAL_CAPSULE | ORAL | Status: AC
  Administered 2017-09-13: 300 mg via ORAL

## 2017-09-13 MED ORDER — PHENYLEPHRINE HCL 10 MG/ML IJ SOLN
INTRAMUSCULAR | Status: DC | PRN
  Administered 2017-09-13: 80 ug via INTRAVENOUS

## 2017-09-13 MED ORDER — SODIUM CHLORIDE 0.9 % IJ SOLN
INTRAMUSCULAR | Status: AC
  Filled 2017-09-13: qty 10

## 2017-09-13 MED ORDER — DEXAMETHASONE SODIUM PHOSPHATE 10 MG/ML IJ SOLN
INTRAMUSCULAR | Status: AC
  Filled 2017-09-13: qty 1

## 2017-09-13 MED ORDER — MIDAZOLAM HCL 2 MG/2ML IJ SOLN: 1.0000 mg | INTRAMUSCULAR | Status: DC | PRN

## 2017-09-13 MED ORDER — MIDAZOLAM HCL 5 MG/5ML IJ SOLN
INTRAMUSCULAR | Status: DC | PRN
  Administered 2017-09-13: 2 mg via INTRAVENOUS

## 2017-09-13 MED ORDER — LIDOCAINE HCL (CARDIAC) 20 MG/ML IV SOLN
INTRAVENOUS | Status: DC | PRN
  Administered 2017-09-13: 80 mg via INTRAVENOUS

## 2017-09-13 MED ORDER — METHYLENE BLUE 0.5 % INJ SOLN
INTRAVENOUS | Status: AC
  Filled 2017-09-13: qty 10

## 2017-09-13 MED ORDER — PROPOFOL 10 MG/ML IV BOLUS
INTRAVENOUS | Status: AC
  Filled 2017-09-13: qty 40

## 2017-09-13 MED ORDER — SCOPOLAMINE 1 MG/3DAYS TD PT72
MEDICATED_PATCH | TRANSDERMAL | Status: AC
  Filled 2017-09-13: qty 1

## 2017-09-13 MED ORDER — SCOPOLAMINE 1 MG/3DAYS TD PT72: 1.0000 | MEDICATED_PATCH | Freq: Once | TRANSDERMAL | Status: DC | PRN

## 2017-09-13 MED ORDER — FENTANYL CITRATE (PF) 100 MCG/2ML IJ SOLN: 25.0000 ug | INTRAMUSCULAR | Status: DC | PRN

## 2017-09-13 MED ORDER — CEFAZOLIN SODIUM-DEXTROSE 2-4 GM/100ML-% IV SOLN
2.0000 g | INTRAVENOUS | Status: AC
  Administered 2017-09-13: 2 g via INTRAVENOUS

## 2017-09-13 MED ORDER — ONDANSETRON HCL 4 MG/2ML IJ SOLN
INTRAMUSCULAR | Status: DC | PRN
  Administered 2017-09-13: 4 mg via INTRAVENOUS

## 2017-09-13 MED ORDER — BUPIVACAINE HCL (PF) 0.25 % IJ SOLN
INTRAMUSCULAR | Status: DC | PRN
  Administered 2017-09-13: 17 mL

## 2017-09-13 MED ORDER — FENTANYL CITRATE (PF) 100 MCG/2ML IJ SOLN: 50.0000 ug | INTRAMUSCULAR | Status: DC | PRN

## 2017-09-13 MED ORDER — LACTATED RINGERS IV SOLN
INTRAVENOUS | Status: DC
  Administered 2017-09-13: 07:00:00 via INTRAVENOUS

## 2017-09-13 MED ORDER — LIDOCAINE 2% (20 MG/ML) 5 ML SYRINGE
INTRAMUSCULAR | Status: AC
  Filled 2017-09-13: qty 5

## 2017-09-13 MED ORDER — ACETAMINOPHEN 500 MG PO TABS
1000.0000 mg | ORAL_TABLET | ORAL | Status: AC
  Administered 2017-09-13: 1000 mg via ORAL

## 2017-09-13 MED ORDER — BUPIVACAINE HCL (PF) 0.25 % IJ SOLN
INTRAMUSCULAR | Status: AC
  Filled 2017-09-13: qty 60

## 2017-09-13 SURGICAL SUPPLY — 58 items
ADH SKN CLS APL DERMABOND .7 (GAUZE/BANDAGES/DRESSINGS) ×1
APPLIER CLIP 9.375 MED OPEN (MISCELLANEOUS)
APR CLP MED 9.3 20 MLT OPN (MISCELLANEOUS)
BINDER BREAST LRG (GAUZE/BANDAGES/DRESSINGS) IMPLANT
BINDER BREAST MEDIUM (GAUZE/BANDAGES/DRESSINGS) IMPLANT
BINDER BREAST XLRG (GAUZE/BANDAGES/DRESSINGS) IMPLANT
BINDER BREAST XXLRG (GAUZE/BANDAGES/DRESSINGS) IMPLANT
BLADE SURG 15 STRL LF DISP TIS (BLADE) ×1 IMPLANT
BLADE SURG 15 STRL SS (BLADE) ×2
CANISTER SUC SOCK COL 7IN (MISCELLANEOUS) IMPLANT
CANISTER SUCT 1200ML W/VALVE (MISCELLANEOUS) IMPLANT
CHLORAPREP W/TINT 26ML (MISCELLANEOUS) ×2 IMPLANT
CLIP APPLIE 9.375 MED OPEN (MISCELLANEOUS) IMPLANT
CLIP VESOCCLUDE SM WIDE 6/CT (CLIP) IMPLANT
COVER BACK TABLE 60X90IN (DRAPES) ×2 IMPLANT
COVER MAYO STAND STRL (DRAPES) ×2 IMPLANT
COVER PROBE W GEL 5X96 (DRAPES) ×2 IMPLANT
DECANTER SPIKE VIAL GLASS SM (MISCELLANEOUS) IMPLANT
DERMABOND ADVANCED (GAUZE/BANDAGES/DRESSINGS) ×1
DERMABOND ADVANCED .7 DNX12 (GAUZE/BANDAGES/DRESSINGS) ×1 IMPLANT
DEVICE DUBIN W/COMP PLATE 8390 (MISCELLANEOUS) ×2 IMPLANT
DRAPE LAPAROSCOPIC ABDOMINAL (DRAPES) ×2 IMPLANT
DRAPE UTILITY XL STRL (DRAPES) ×2 IMPLANT
DRSG TEGADERM 4X4.75 (GAUZE/BANDAGES/DRESSINGS) IMPLANT
ELECT COATED BLADE 2.86 ST (ELECTRODE) ×2 IMPLANT
ELECT REM PT RETURN 9FT ADLT (ELECTROSURGICAL) ×2
ELECTRODE REM PT RTRN 9FT ADLT (ELECTROSURGICAL) ×1 IMPLANT
GAUZE SPONGE 4X4 12PLY STRL LF (GAUZE/BANDAGES/DRESSINGS) IMPLANT
GLOVE BIOGEL PI IND STRL 7.5 (GLOVE) ×1 IMPLANT
GLOVE BIOGEL PI INDICATOR 7.5 (GLOVE) ×1
GLOVE SURG SS PI 6.5 STRL IVOR (GLOVE) ×1 IMPLANT
GLOVE SURG SS PI 7.0 STRL IVOR (GLOVE) ×1 IMPLANT
GOWN STRL REUS W/ TWL LRG LVL3 (GOWN DISPOSABLE) ×2 IMPLANT
GOWN STRL REUS W/TWL LRG LVL3 (GOWN DISPOSABLE) ×4
HEMOSTAT ARISTA ABSORB 3G PWDR (MISCELLANEOUS) IMPLANT
ILLUMINATOR WAVEGUIDE N/F (MISCELLANEOUS) ×1 IMPLANT
KIT MARKER MARGIN INK (KITS) ×2 IMPLANT
LIGHT WAVEGUIDE WIDE FLAT (MISCELLANEOUS) IMPLANT
NDL HYPO 25X1 1.5 SAFETY (NEEDLE) ×1 IMPLANT
NEEDLE HYPO 25X1 1.5 SAFETY (NEEDLE) ×2 IMPLANT
NS IRRIG 1000ML POUR BTL (IV SOLUTION) IMPLANT
PACK BASIN DAY SURGERY FS (CUSTOM PROCEDURE TRAY) ×2 IMPLANT
PENCIL BUTTON HOLSTER BLD 10FT (ELECTRODE) ×2 IMPLANT
SLEEVE SCD COMPRESS KNEE MED (MISCELLANEOUS) ×2 IMPLANT
SPONGE LAP 4X18 X RAY DECT (DISPOSABLE) ×2 IMPLANT
STRIP CLOSURE SKIN 1/2X4 (GAUZE/BANDAGES/DRESSINGS) ×2 IMPLANT
SUT MNCRL AB 4-0 PS2 18 (SUTURE) IMPLANT
SUT MON AB 5-0 PS2 18 (SUTURE) ×1 IMPLANT
SUT SILK 2 0 SH (SUTURE) IMPLANT
SUT VIC AB 2-0 SH 27 (SUTURE) ×2
SUT VIC AB 2-0 SH 27XBRD (SUTURE) ×1 IMPLANT
SUT VIC AB 3-0 SH 27 (SUTURE) ×2
SUT VIC AB 3-0 SH 27X BRD (SUTURE) ×1 IMPLANT
SYR CONTROL 10ML LL (SYRINGE) ×2 IMPLANT
TOWEL OR 17X24 6PK STRL BLUE (TOWEL DISPOSABLE) ×2 IMPLANT
TOWEL OR NON WOVEN STRL DISP B (DISPOSABLE) ×2 IMPLANT
TUBE CONNECTING 20X1/4 (TUBING) IMPLANT
YANKAUER SUCT BULB TIP NO VENT (SUCTIONS) IMPLANT

## 2017-09-13 NOTE — Interval H&P Note (Signed)
History and Physical Interval Note:  09/13/2017 7:15 AM  Alice Pollard  has presented today for surgery, with the diagnosis of LEFT BREAST MASS  The various methods of treatment have been discussed with the patient and family. After consideration of risks, benefits and other options for treatment, the patient has consented to  Procedure(s): RADIOACTIVE SEED GUIDED EXCISIONAL BREAST BIOPSY (Left) as a surgical intervention .  The patient's history has been reviewed, patient examined, no change in status, stable for surgery.  I have reviewed the patient's chart and labs.  Questions were answered to the patient's satisfaction.     Rolm Bookbinder

## 2017-09-13 NOTE — Anesthesia Procedure Notes (Signed)
Procedure Name: LMA Insertion Date/Time: 09/13/2017 7:39 AM Performed by: Genevie Ann, CRNA Pre-anesthesia Checklist: Patient identified, Emergency Drugs available, Suction available, Patient being monitored and Timeout performed Patient Re-evaluated:Patient Re-evaluated prior to induction Oxygen Delivery Method: Circle system utilized Preoxygenation: Pre-oxygenation with 100% oxygen Induction Type: IV induction LMA: LMA inserted LMA Size: 4.0 Number of attempts: 1 ETT to lip (cm): at lips. Dental Injury: Teeth and Oropharynx as per pre-operative assessment

## 2017-09-13 NOTE — Transfer of Care (Signed)
Immediate Anesthesia Transfer of Care Note  Patient: Alice Pollard  Procedure(s) Performed: RADIOACTIVE SEED GUIDED EXCISIONAL BREAST BIOPSY (Left Breast)  Patient Location: PACU  Anesthesia Type:General  Level of Consciousness: awake, alert  and oriented  Airway & Oxygen Therapy: Patient Spontanous Breathing and Patient connected to nasal cannula oxygen  Post-op Assessment: Report given to RN and Post -op Vital signs reviewed and stable  Post vital signs: Reviewed and stable  Last Vitals:  Vitals:   09/13/17 0701  BP: 130/83  Pulse: 88  Resp: 16  Temp: 36.8 C  SpO2: 97%    Last Pain:  Vitals:   09/13/17 0701  TempSrc: Oral         Complications: No apparent anesthesia complications

## 2017-09-13 NOTE — Discharge Instructions (Signed)
°Post Anesthesia Home Care Instructions ° °Activity: °Get plenty of rest for the remainder of the day. A responsible individual must stay with you for 24 hours following the procedure.  °For the next 24 hours, DO NOT: °-Drive a car °-Operate machinery °-Drink alcoholic beverages °-Take any medication unless instructed by your physician °-Make any legal decisions or sign important papers. ° °Meals: °Start with liquid foods such as gelatin or soup. Progress to regular foods as tolerated. Avoid greasy, spicy, heavy foods. If nausea and/or vomiting occur, drink only clear liquids until the nausea and/or vomiting subsides. Call your physician if vomiting continues. ° °Special Instructions/Symptoms: °Your throat may feel dry or sore from the anesthesia or the breathing tube placed in your throat during surgery. If this causes discomfort, gargle with warm salt water. The discomfort should disappear within 24 hours. ° °If you had a scopolamine patch placed behind your ear for the management of post- operative nausea and/or vomiting: ° °1. The medication in the patch is effective for 72 hours, after which it should be removed.  Wrap patch in a tissue and discard in the trash. Wash hands thoroughly with soap and water. °2. You may remove the patch earlier than 72 hours if you experience unpleasant side effects which may include dry mouth, dizziness or visual disturbances. °3. Avoid touching the patch. Wash your hands with soap and water after contact with the patch. °  ° ° ° °[facility] °Office Phone Number [phone] ° °POST OP INSTRUCTIONS ° °Always review your discharge instruction sheet given to you by the facility where your surgery was performed. ° °IF YOU HAVE DISABILITY OR FAMILY LEAVE FORMS, YOU MUST BRING THEM TO THE OFFICE FOR PROCESSING.  DO NOT GIVE THEM TO YOUR DOCTOR. ° °1. A prescription for pain medication may be given to you upon discharge.  Take your pain medication as prescribed, if  needed.  If narcotic pain medicine is not needed, then you may take acetaminophen (Tylenol), naprosyn (Alleve) or ibuprofen (Advil) as needed. °2. Take your usually prescribed medications unless otherwise directed °3. If you need a refill on your pain medication, please contact your pharmacy.  They will contact our office to request authorization.  Prescriptions will not be filled after 5pm or on week-ends. °4. You should eat very light the first 24 hours after surgery, such as soup, crackers, pudding, etc.  Resume your normal diet the day after surgery. °5. Most patients will experience some swelling and bruising in the breast.  Ice packs and a good support bra will help.  Wear the breast binder provided or a sports bra for 72 hours day and night.  After that wear a sports bra during the day until you return to the office. Swelling and bruising can take several days to resolve.  °6. It is common to experience some constipation if taking pain medication after surgery.  Increasing fluid intake and taking a stool softener will usually help or prevent this problem from occurring.  A mild laxative (Milk of Magnesia or Miralax) should be taken according to package directions if there are no bowel movements after 48 hours. °7. Unless discharge instructions indicate otherwise, you may remove your bandages 48 hours after surgery and you may shower at that time.  You may have steri-strips (small skin tapes) in place directly over the incision.  These strips should be left on the skin for 7-10 days and will come off on their own.  If your surgeon used skin glue   on the incision, you may shower in 24 hours.  The glue will flake off over the next 2-3 weeks.  Any sutures or staples will be removed at the office during your follow-up visit. °8. ACTIVITIES:  You may resume regular daily activities (gradually increasing) beginning the next day.  Wearing a good support bra or sports bra minimizes pain and swelling.  You may have  sexual intercourse when it is comfortable. °a. You may drive when you no longer are taking prescription pain medication, you can comfortably wear a seatbelt, and you can safely maneuver your car and apply brakes. °b. RETURN TO WORK:  ______________________________________________________________________________________ °9. You should see your doctor in the office for a follow-up appointment approximately two weeks after your surgery.  Your doctor’s nurse will typically make your follow-up appointment when she calls you with your pathology report.  Expect your pathology report 3-4 business days after your surgery.  You may call to check if you do not hear from us after three days. °10. OTHER INSTRUCTIONS: _______________________________________________________________________________________________ _____________________________________________________________________________________________________________________________________ °_____________________________________________________________________________________________________________________________________ °_____________________________________________________________________________________________________________________________________ ° °WHEN TO CALL DR WAKEFIELD: °1. Fever over 101.0 °2. Nausea and/or vomiting. °3. Extreme swelling or bruising. °4. Continued bleeding from incision. °5. Increased pain, redness, or drainage from the incision. ° °The clinic staff is available to answer your questions during regular business hours.  Please don’t hesitate to call and ask to speak to one of the nurses for clinical concerns.  If you have a medical emergency, go to the nearest emergency room or call 911.  A surgeon from [facility] is always on call at the hospital. ° °For further questions, please visit [email or web address] ° °

## 2017-09-13 NOTE — H&P (Signed)
9 yof referred by Dr Sabra Heck for left breast lesion on mm. She was noted previously to have an elevated wbc on screening labs prior to csc. she was then seed by Dr Alen Blew and diagnosed with CLL. she was being observed for that. she then underwent screening mm. she had no mass or dc. she has no fh of breast or ovarian cancer. she has c density breasts. she was noted to have a left breast distortion on mm. [place] then showed no correlate for distortion on mm. US of the left axilla shows multiple enlarged and morphologically abnl nodes. stereo biopsy of the breast lesion and US biopsy of the nodes was performed. the breast biopsy shows patchy atypical lymphoid infiltrates and a csl. 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 she is here to discuss options.   Past Surgical History Breast Biopsy  Left. Colon Polyp Removal - Colonoscopy  Oral Surgery  Tonsillectomy   Diagnostic Studies History  Colonoscopy  within last year  Social History  Alcohol use  Occasional alcohol use. Caffeine use  Coffee. No drug use  Tobacco use  Never smoker.  Family History Cerebrovascular Accident  Mother. Diabetes Mellitus  Father. Heart Disease  Father, Mother. Hypertension  Father, Mother. Melanoma  Sister.  Other Problems High blood pressure  Hypercholesterolemia   Review of Systems  General Not Present- Appetite Loss, Chills, Fatigue, Fever, Night Sweats, Weight Gain and Weight Loss. Skin Not Present- Change in Wart/Mole, Dryness, Hives, Jaundice, New Lesions, Non-Healing Wounds, Rash and Ulcer. HEENT Not Present- Earache, Hearing Loss, Hoarseness, Nose Bleed, Oral Ulcers, Ringing in the Ears, Seasonal Allergies, Sinus Pain, Sore Throat, Visual Disturbances, Wears glasses/contact lenses and Yellow Eyes. Breast Present- Breast Mass. Not Present- Breast Pain, Nipple Discharge and Skin Changes. Cardiovascular Not Present- Chest Pain, Difficulty Breathing Lying Down,  Leg Cramps, Palpitations, Rapid Heart Rate, Shortness of Breath and Swelling of Extremities. Gastrointestinal Not Present- Abdominal Pain, Bloating, Bloody Stool, Change in Bowel Habits, Chronic diarrhea, Constipation, Difficulty Swallowing, Excessive gas, Gets full quickly at meals, Hemorrhoids, Indigestion, Nausea, Rectal Pain and Vomiting. Neurological Not Present- Decreased Memory, Fainting, Headaches, Numbness, Seizures, Tingling, Tremor, Trouble walking and Weakness. Endocrine Present- Hot flashes. Not Present- Cold Intolerance, Excessive Hunger, Hair Changes, Heat Intolerance and New Diabetes.  Vitals  Weight: 158.8 lb Height: 63in Body Surface Area: 1.75 m Body Mass Index: 28.13 kg/m  Temp.: 96.82F  Pulse: 74 (Regular)  P.OX: 98% (Room air) BP: 126/76 (Sitting, Left Arm, Standard) Physical Exam  General Mental Status-Alert. Orientation-Oriented X3. Head and Neck Trachea-midline. Thyroid Gland Characteristics - normal size and consistency. Eye Sclera/Conjunctiva - Bilateral-No scleral icterus. Pupil - Bilateral-Normal. Chest and Lung Exam Chest and lung exam reveals -quiet, even and easy respiratory effort with no use of accessory muscles and on auscultation, normal breath sounds, no adventitious sounds and normal vocal resonance. Breast Nipples-No Discharge. Breast Lump-No Palpable Breast Mass. Cardiovascular Cardiovascular examination reveals -normal heart sounds, regular rate and rhythm with no murmurs, normal pedal pulses bilaterally and no digital clubbing, cyanosis, edema, increased warmth or tenderness. Abdomen Note: soft nt Lymphatic Head & Neck General Head & Neck Lymphatics: Bilateral - Description - Normal. Axillary General Axillary Region: Bilateral - Description - Normal. Note: despite [place] I dont feel any enlarged left axillary nodes Note: no [place] adenopathy  Assessment & Plan RADIAL SCAR OF BREAST (M35.36) Story: We discussed  breast lesion as well as nodal biopsy. If she does have CLL and is being observed I think reasonable to treat  breast lesion. we discussed indication for excision as well as option for follow up. there is up to 10% upgrade rate of a csl but majority of these will be aytpia and not invasive cancer or dcis. if she is candidate for chemoprevention then certainly excision should be considered. she is due to see Dr Alen Blew to discuss this soon. I will wait until that appt to schedule any surgery. I discussed a seed guided excisional biopsy with risks, recovery with her today

## 2017-09-13 NOTE — Op Note (Signed)
Preoperative diagnosesleft breast mass Postoperative diagnosis: Same as above Procedure: Leftbreast seed guided excisional biopsy Surgeon: Dr. Serita Grammes Anesthesia: Gen. Estimated blood loss: minimal Complications: None Drains: None Specimens: Leftbreast tissue marked with paint Sponge and needle count correct at completion Disposition to recovery stable  Indications: This is a 38 yof with left breast mass on screening mammogram.  The core biopsy is a csl. She was referred for consideration of excision. We have discussed options and have elected to proceed with seed guided excision and duct excision  Procedure: After informed consent was obtained she was then taken to the operating room. She was given antibiotics.Sequential compression devices were on her legs. She was placed under general anesthesia without complication. Her chestwas then prepped and draped in the standard sterile surgical fashion. A surgical timeout was then performed.   The seed was in thelateral leftbreast.I infiltrated marcaine and made a periareolar incision to hide the scar.I used the lighted retractors to tunnel to the lateral lesion using the seed as guidance.  I then used the neoprobe to guide excision of the seedand surrounding tissue.Mammogram confirmed removal of seed and the clip. This was then all sent to pathology. Hemostasis was observed. I closed the breast tissue with a 2-0 Vicryl. The dermis was closed with 3-0 Vicryl and the skin with 5-0 Monocryl.Dermabond and steristrips were placed on the incision. A breast binder was placed. She was transferred to recovery stable

## 2017-09-13 NOTE — Anesthesia Postprocedure Evaluation (Addendum)
Anesthesia Post Note  Patient: Alice Pollard  Procedure(s) Performed: RADIOACTIVE SEED GUIDED EXCISIONAL BREAST BIOPSY (Left Breast)     Patient location during evaluation: PACU Anesthesia Type: General Level of consciousness: awake and alert Pain management: pain level controlled Vital Signs Assessment: post-procedure vital signs reviewed and stable Respiratory status: spontaneous breathing, nonlabored ventilation, respiratory function stable and patient connected to nasal cannula oxygen Cardiovascular status: blood pressure returned to baseline and stable Postop Assessment: no apparent nausea or vomiting Anesthetic complications: no    Last Vitals:  Vitals:   09/13/17 0845 09/13/17 0910  BP: 110/87 113/79  Pulse: 69 70  Resp: (!) 22 20  Temp:  36.4 C  SpO2: 98% 99%    Last Pain:  Vitals:   09/13/17 0910  TempSrc:   PainSc: 0-No pain                 Effie Berkshire

## 2017-09-14 ENCOUNTER — Encounter (HOSPITAL_BASED_OUTPATIENT_CLINIC_OR_DEPARTMENT_OTHER): Payer: Self-pay | Admitting: General Surgery

## 2017-09-14 NOTE — Addendum Note (Signed)
Addendum  created 09/14/17 1832 by Effie Berkshire, MD   Sign clinical note

## 2017-12-02 ENCOUNTER — Other Ambulatory Visit: Payer: Self-pay | Admitting: Obstetrics & Gynecology

## 2017-12-02 NOTE — Telephone Encounter (Signed)
Medication refill request: FLUoxetine Last AEX:  10/28/16 SM Next AEX: 01/27/18  Last MMG (if hormonal medication request): see Epic for details -- patient has had biopsies Refill authorized: 10/28/16 #90 w/4 refills; today please advise

## 2018-01-27 ENCOUNTER — Encounter

## 2018-01-27 ENCOUNTER — Ambulatory Visit: Payer: 59 | Admitting: Obstetrics & Gynecology

## 2018-02-15 ENCOUNTER — Telehealth: Payer: Self-pay | Admitting: Oncology

## 2018-02-15 NOTE — Telephone Encounter (Signed)
Left message on voicemail for patient regarding appointment per 5/15 sch msg

## 2018-02-17 ENCOUNTER — Inpatient Hospital Stay: Payer: 59 | Attending: Oncology

## 2018-02-17 ENCOUNTER — Inpatient Hospital Stay (HOSPITAL_BASED_OUTPATIENT_CLINIC_OR_DEPARTMENT_OTHER): Payer: 59 | Admitting: Oncology

## 2018-02-17 ENCOUNTER — Telehealth: Payer: Self-pay | Admitting: Oncology

## 2018-02-17 VITALS — BP 141/94 | HR 81 | Temp 98.3°F | Resp 18 | Ht 63.0 in | Wt 160.4 lb

## 2018-02-17 DIAGNOSIS — N6489 Other specified disorders of breast: Secondary | ICD-10-CM | POA: Insufficient documentation

## 2018-02-17 DIAGNOSIS — C9190 Lymphoid leukemia, unspecified not having achieved remission: Secondary | ICD-10-CM

## 2018-02-17 DIAGNOSIS — C9110 Chronic lymphocytic leukemia of B-cell type not having achieved remission: Secondary | ICD-10-CM

## 2018-02-17 DIAGNOSIS — Z79899 Other long term (current) drug therapy: Secondary | ICD-10-CM | POA: Insufficient documentation

## 2018-02-17 LAB — COMPREHENSIVE METABOLIC PANEL
ALT: 18 U/L (ref 0–55)
AST: 23 U/L (ref 5–34)
Albumin: 4.4 g/dL (ref 3.5–5.0)
Alkaline Phosphatase: 96 U/L (ref 40–150)
Anion gap: 10 (ref 3–11)
BUN: 28 mg/dL — ABNORMAL HIGH (ref 7–26)
CO2: 29 mmol/L (ref 22–29)
Calcium: 10 mg/dL (ref 8.4–10.4)
Chloride: 102 mmol/L (ref 98–109)
Creatinine, Ser: 0.86 mg/dL (ref 0.60–1.10)
GFR calc Af Amer: >60 mL/min (ref >60)
GFR calc non Af Amer: >60 mL/min (ref >60)
Glucose, Bld: 94 mg/dL (ref 70–140)
Potassium: 3.8 mmol/L (ref 3.5–5.1)
Sodium: 141 mmol/L (ref 136–145)
Total Bilirubin: 0.6 mg/dL (ref 0.2–1.2)
Total Protein: 7.3 g/dL (ref 6.4–8.3)

## 2018-02-17 LAB — CBC WITH DIFFERENTIAL/PLATELET
Basophils Absolute: 0.1 10*3/uL (ref 0.0–0.1)
Basophils Relative: 0 %
Eosinophils Absolute: 0.1 10*3/uL (ref 0.0–0.5)
Eosinophils Relative: 0 %
HCT: 40.7 % (ref 34.8–46.6)
Hemoglobin: 13.8 g/dL (ref 11.6–15.9)
Lymphocytes Relative: 77 %
Lymphs Abs: 18.4 10*3/uL — ABNORMAL HIGH (ref 0.9–3.3)
MCH: 32.9 pg (ref 25.1–34.0)
MCHC: 33.9 g/dL (ref 31.5–36.0)
MCV: 97.1 fL (ref 79.5–101.0)
Monocytes Absolute: 0.6 10*3/uL (ref 0.1–0.9)
Monocytes Relative: 3 %
Neutro Abs: 4.8 10*3/uL (ref 1.5–6.5)
Neutrophils Relative %: 20 %
Platelets: 219 10*3/uL (ref 145–400)
RBC: 4.2 MIL/uL (ref 3.70–5.45)
RDW: 13.2 % (ref 11.2–14.5)
WBC: 23.9 10*3/uL — ABNORMAL HIGH (ref 3.9–10.3)

## 2018-02-17 NOTE — Telephone Encounter (Signed)
Appointments scheduled AVS/Calendar printed per 5/17 los °

## 2018-02-17 NOTE — Progress Notes (Signed)
Hematology and Oncology Follow Up Visit  Alice Pollard [id number] Feb 09, 1951 67 y.o. 02/17/2018 1:42 PM Patient, No Pcp PerNo ref. provider found   Principle Diagnosis: 67 year old woman with:  1.  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 She presented with leukocytosis with lymphocytosis and small lymphadenopathy.  2. 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  She is status post excisional biopsy completed on September 13, 2017.  No carcinoma noted.  Current therapy: Active surveillance.  Interim History: Alice Pollard is here for a follow-up visit.  Since the last visit, she underwent excisional biopsy of her left breast mass which showed a complex sclerosing lesion with patchy cellular infiltration suggestive of CLL.  She reports no recent changes in her health or recent complaints.  She continues to attend to activities of daily living without any decline.  She denies any constitutional symptoms of fevers or chills.  She denies any painful adenopathy.  She denies any recurrent infections.  She does not report any headaches, blurry vision, syncope or seizures. She does not report any appetite changes or weight loss. She does not report any chest pain, palpitation, orthopnea or leg edema. She does not report any cough, wheezing or hemoptysis. She is not report any nausea, vomiting or abdominal pain. She does not report any frequency urgency or hesitancy. She does not report any skeletal complaints. Remaining review of systems is negative.  Medications: I have reviewed the patient's current medications.  Current Outpatient Medications  Medication Sig Dispense Refill  . CALCIUM PO Take 1 tablet by mouth daily. PATIENT NOT SURE OF DOSAGE    . Cetirizine HCl (ZYRTEC PO) Take 1 tablet by mouth daily.     Marland Kitchen FLUoxetine (PROZAC) 20 MG tablet TAKE 1 TABLET(20 MG) BY MOUTH DAILY 90 tablet 1  . fluticasone (FLONASE) 50 MCG/ACT nasal spray Place 1 spray into both nostrils daily as needed for  allergies.   0  . metoprolol tartrate (LOPRESSOR) 25 MG tablet Take 0.5 tablets (12.5 mg total) by mouth 2 (two) times daily. 60 tablet 5  . montelukast (SINGULAIR) 10 MG tablet Take 10 mg by mouth daily as needed (for allergies).     Marland Kitchen telmisartan-hydrochlorothiazide (MICARDIS HCT) 80-25 MG tablet Take 1 tablet by mouth daily. 30 tablet 10   No current facility-administered medications for this visit.      Allergies:  Allergies  Allergen Reactions  . Codeine Other (See Comments)    Fainted   . Latex Itching and Other (See Comments)    sensitivity    Past Medical History, Surgical history, Social history, and Family History were reviewed and updated.  Physical Exam: Blood pressure (!) 141/94, pulse 81, temperature 98.3 F (36.8 C), temperature source Oral, resp. rate 18, height 5\' 3"  (1.6 m), weight 160 lb 6.4 oz (72.8 kg), last menstrual period 08/05/2007, SpO2 97 %. ECOG: 0 General appearance: Well-appearing woman without distress. Head: Atraumatic without abnormalities. Oropharynx: Without any thrush or ulcers. Eyes: No scleral icterus. Lymph nodes: No lymphadenopathy noted cervical, supraclavicular, and axillary nodes  Heart: Regular rate and rhythm without any murmurs or gallops. Lung: Clear to auscultation without any rhonchi, wheezes or dullness to percussion. Abdomin: Soft, nontender without any rebound or guarding. Musculoskeletal: No joint deformity or effusion.   Lab Results: Lab Results  Component Value Date   WBC 23.9 (H) 02/17/2018   HGB 13.8 02/17/2018   HCT 40.7 02/17/2018   MCV 97.1 02/17/2018   PLT 219 02/17/2018     Chemistry  Component Value Date/Time   NA 142 08/19/2017 1410   K 3.8 08/19/2017 1410   CL 102 03/29/2016 1448   CO2 29 08/19/2017 1410   BUN 19.9 08/19/2017 1410   CREATININE 0.8 08/19/2017 1410      Component Value Date/Time   CALCIUM 9.9 08/19/2017 1410   ALKPHOS 89 08/19/2017 1410   AST 20 08/19/2017 1410   ALT 14  08/19/2017 1410   BILITOT 0.60 08/19/2017 1410       Impression and Plan:  67 year old woman with:  1.  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 She was found to have leukocytosis and axillary lymphadenopathy.  The natural course of this disease was discussed today with the patient as well as indication for treatment were reviewed.  She has leukocytosis and lymphadenopathy but no constitutional symptoms or indication for treatments.  These indications were include worsening cytopenias, painful adenopathy among others.  Treatment for this condition were also outlined in detail which include systemic chemotherapy, immunotherapy as well as oral targeted therapy.  After discussing the risks and benefits of treatment versus active surveillance, we have elected to continue with active surveillance and institute treatment if she is symptomatic.  2. Complex sclerotic lesion of the left breast: She is status post surgical therapy completed in December 2018.  No evidence of malignancy identified.  3. Autoimmune cytopenias: No evidence of thrombocytopenia or autoimmune hemolytic anemia by lab review today.  We will continue to monitor periodically.  4. Opportunistic infection: No recent infections noted at this time.  5. Follow-up: Will be in 6 months and sooner if needed.   15  minutes was spent with the patient face-to-face today.  More than 50% of time was dedicated to patient counseling, education and coordination of her care.    Zola Button, MD 5/17/20191:42 PM

## 2018-02-20 ENCOUNTER — Encounter: Payer: Self-pay | Admitting: Obstetrics & Gynecology

## 2018-02-20 ENCOUNTER — Other Ambulatory Visit: Payer: Self-pay

## 2018-02-20 ENCOUNTER — Ambulatory Visit (INDEPENDENT_AMBULATORY_CARE_PROVIDER_SITE_OTHER): Payer: 59 | Admitting: Obstetrics & Gynecology

## 2018-02-20 VITALS — BP 142/88 | HR 80 | Resp 16 | Ht 62.5 in | Wt 159.0 lb

## 2018-02-20 DIAGNOSIS — Z01419 Encounter for gynecological examination (general) (routine) without abnormal findings: Secondary | ICD-10-CM | POA: Diagnosis not present

## 2018-02-20 NOTE — Progress Notes (Signed)
67 y.o. G3P3 MarriedCaucasianF here for annual exam.  Doing well.  Diagnosed with CLL after having a mammogram last year.  Also had a sclerosing lesion in the breast s/p mastectomy.  Being followed by Dr. Alen Blew every six months.    Denies vaginal bleeding.    PCP:  Dr. Forde Dandy  Patient's last menstrual period was 08/05/2007.          Sexually active: Yes.    The current method of family planning is post menopausal status.    Exercising: Yes.    golf Smoker:  no  Health Maintenance: Pap:  10/28/16 Neg. HR HPV:neg   History of abnormal Pap:  no MMG:  04/14/17 Left Bx Complex Sclerosing Lesion. CLL Colonoscopy: 01/2017 polyps. F/u 5 years BMD:   04/19/13 Normal  TDaP:  2017 Pneumonia vaccine(s):  No Shingrix: No Hep C testing: Unsure Screening Labs: PCP   reports that she has never smoked. She has never used smokeless tobacco. She reports that she drinks about 1.8 oz of alcohol per week. She reports that she does not use drugs.  Past Medical History:  Diagnosis Date  . ADD (attention deficit disorder)   . Cancer (HCC)     chronic leukemia  . Dyslipidemia   . Endometriosis   . Hematuria    h/o asymptomatic  . HTN (hypertension)   . PONV (postoperative nausea and vomiting)     Past Surgical History:  Procedure Laterality Date  . LAPAROSCOPY     endometriosis  . RADIOACTIVE SEED GUIDED EXCISIONAL BREAST BIOPSY Left 09/13/2017   Procedure: RADIOACTIVE SEED GUIDED EXCISIONAL BREAST BIOPSY;  Surgeon: Rolm Bookbinder, MD;  Location: [facility];  Service: General;  Laterality: Left;    Current Outpatient Medications  Medication Sig Dispense Refill  . CALCIUM PO Take 1 tablet by mouth daily. PATIENT NOT SURE OF DOSAGE    . Cetirizine HCl (ZYRTEC PO) Take 1 tablet by mouth daily.     Marland Kitchen FLUoxetine (PROZAC) 20 MG tablet TAKE 1 TABLET(20 MG) BY MOUTH DAILY 90 tablet 1  . metoprolol tartrate (LOPRESSOR) 25 MG tablet Take 0.5 tablets (12.5 mg total) by mouth 2 (two)  times daily. 60 tablet 5  . telmisartan-hydrochlorothiazide (MICARDIS HCT) 80-25 MG tablet Take 1 tablet by mouth daily. 30 tablet 10   No current facility-administered medications for this visit.     Family History  Problem Relation Age of Onset  . Heart failure Mother   . Diabetes Father   . Other Brother        bilateral bladder  . Colon cancer Neg Hx     Review of Systems  All other systems reviewed and are negative.   Exam:   BP (!) 142/88 (BP Location: Right Arm, Patient Position: Sitting, Cuff Size: Normal)   Pulse 80   Resp 16   Ht 5' 2.5" (1.588 m)   Wt 159 lb (72.1 kg)   LMP 08/05/2007   BMI 28.62 kg/m    Height: 5' 2.5" (158.8 cm)  Ht Readings from Last 3 Encounters:  02/20/18 5' 2.5" (1.588 m)  02/17/18 5\' 3"  (1.6 m)  09/13/17 5\' 3"  (1.6 m)    General appearance: alert, cooperative and appears stated age Head: Normocephalic, without obvious abnormality, atraumatic Neck: no adenopathy, supple, symmetrical, trachea midline and thyroid normal to inspection and palpation Lungs: clear to auscultation bilaterally Breasts: normal appearance, no masses or tenderness Heart: regular rate and rhythm Abdomen: soft, non-tender; bowel sounds normal; no masses,  no  organomegaly Extremities: extremities normal, atraumatic, no cyanosis or edema Skin: Skin color, texture, turgor normal. No rashes or lesions Lymph nodes: Cervical, supraclavicular, and axillary nodes normal. No abnormal inguinal nodes palpated Neurologic: Grossly normal   Pelvic: External genitalia:  no lesions              Urethra:  normal appearing urethra with no masses, tenderness or lesions              Bartholins and Skenes: normal                 Vagina: normal appearing vagina with normal color and discharge, no lesions              Cervix: no lesions              Pap taken: No. Bimanual Exam:  Uterus:  normal size, contour, position, consistency, mobility, non-tender              Adnexa: normal  adnexa and no mass, fullness, tenderness               Rectovaginal: Confirms               Anus:  normal sphincter tone, no lesions  Chaperone was present for exam.  A:  Well Woman with normal exam PMP, no HRT H/o microscopic hematuria Hypertension CLL H/o sclerosing breast lesion  P:   Mammogram guidelines reviewed pap smear with neg HR HPV 2018.  No pap smear obtained today Lab work just done with Dr. Forde Dandy Aware should starting Shingrix and pneumonia vaccines Return annually or prn

## 2018-05-05 ENCOUNTER — Telehealth: Payer: Self-pay | Admitting: Cardiology

## 2018-05-05 ENCOUNTER — Encounter: Payer: Self-pay | Admitting: Cardiology

## 2018-05-05 MED ORDER — TELMISARTAN-HCTZ 80-25 MG PO TABS: 1.0000 | ORAL_TABLET | Freq: Every day | ORAL | 1 refills | Status: DC

## 2018-05-05 NOTE — Telephone Encounter (Signed)
New Message         *STAT* If patient is at the pharmacy, call can be transferred to refill team.   1. Which medications need to be refilled? (please list name of each medication and dose if known)telmisartan-hydrochlorothiazide (MICARDIS HCT) 80-25 MG tablet   2. Which pharmacy/location (including street and city if local pharmacy) is medication to be sent to? [place] [facility]  3. Do they need a 30 day or 90 day supply? 90        Patient will be out of meds tomorrow, pls call in today

## 2018-06-15 ENCOUNTER — Other Ambulatory Visit: Payer: Self-pay | Admitting: Obstetrics & Gynecology

## 2018-06-15 NOTE — Telephone Encounter (Signed)
Medication refill request: Fluoxetine  Last AEX: 02/20/18 SM  Next AEX: 05/25/19  Last MMG (if hormonal medication request): see Epic for details Refill authorized: 12/02/17 #90 w/1 refill; today please advise

## 2018-07-10 ENCOUNTER — Other Ambulatory Visit: Payer: Self-pay | Admitting: Cardiology

## 2018-07-27 NOTE — Progress Notes (Signed)
Cardiology Office Note:    Date:  07/28/2018   ID:  Alice Pollard, DOB 1950-11-21, MRN [id number]  PCP:  [name], MD  Cardiologist:  Dr. Gerlad Pelzel [place]   Electrophysiologist:  n/a  Referring MD: [name], MD   Chief Complaint  Patient presents with  . Palpitations    History of Present Illness:    Alice Pollard is a 67 y.o. female with a hx of HTN, HL, PVCs.  Seen on 03/29/16 for annual FU.  Her ECG demonstrated asymptomatic PVCs in a bigeminal pattern. Echocardiogram demonstrated normal LV function. Patient was placed on Metoprolol tartrate 12.5 bid.    Seen by Richardson Dopp PAC in July 2017.  She never started the Metoprolol. This was resumed and a follow up Holter done in August. This showed PACs and PVCs with low arrhythmia burden with only 452 PVCs in 24 hours.   On follow up today she has no real complaints. Denies palpitations. She denies chest pain,  DOE, orthopnea, PND, edema, syncope, near syncope. Her BP is well controlled. She has been diagnosed with stage 1 CLL and is followed by oncology.  Past Medical History:  Diagnosis Date  . ADD (attention deficit disorder)   . CLL (chronic lymphocytic leukemia) ([place])   . Dyslipidemia   . Endometriosis   . Hematuria    h/o asymptomatic  . HTN (hypertension)   . PONV (postoperative nausea and vomiting)     Past Surgical History:  Procedure Laterality Date  . LAPAROSCOPY     endometriosis  . RADIOACTIVE SEED GUIDED EXCISIONAL BREAST BIOPSY Left 09/13/2017   Procedure: RADIOACTIVE SEED GUIDED EXCISIONAL BREAST BIOPSY;  Surgeon: Rolm Bookbinder, MD;  Location: [facility];  Service: General;  Laterality: Left;    Current Medications: Outpatient Medications Prior to Visit  Medication Sig Dispense Refill  . CALCIUM PO Take 1 tablet by mouth daily. PATIENT NOT SURE OF DOSAGE    . Cetirizine HCl (ZYRTEC PO) Take 1 tablet by mouth daily.     Marland Kitchen FLUoxetine (PROZAC) 20 MG tablet TAKE 1 TABLET(20  MG) BY MOUTH DAILY 90 tablet 0  . Magnesium Gluconate (MAGNESIUM 27 PO) Take by mouth.    . metoprolol tartrate (LOPRESSOR) 25 MG tablet Take 0.5 tablets (12.5 mg total) by mouth 2 (two) times daily. NEED OV. 30 tablet 0  . Multiple Vitamin (MULTIVITAMIN) tablet Take 1 tablet by mouth daily.    . potassium chloride (K-DUR) 10 MEQ tablet Take 10 mEq by mouth daily.    Marland Kitchen telmisartan-hydrochlorothiazide (MICARDIS HCT) 80-25 MG tablet Take 1 tablet by mouth daily. 90 tablet 1   No facility-administered medications prior to visit.       Allergies:   Codeine and Latex   Social History   Socioeconomic History  . Marital status: Married    Spouse name: Not on file  . Number of children: 3  . Years of education: Not on file  . Highest education level: Not on file  Occupational History  . Occupation: Journalist, newspaper  Social Needs  . Financial resource strain: Not on file  . Food insecurity:    Worry: Not on file    Inability: Not on file  . Transportation needs:    Medical: Not on file    Non-medical: Not on file  Tobacco Use  . Smoking status: Never Smoker  . Smokeless tobacco: Never Used  Substance and Sexual Activity  . Alcohol use: Yes    Alcohol/week: 3.0 standard drinks  Types: 3 Standard drinks or equivalent per week    Comment: socially  . Drug use: No  . Sexual activity: Yes    Partners: Male    Birth control/protection: Post-menopausal    Comment: vasectomy  Lifestyle  . Physical activity:    Days per week: Not on file    Minutes per session: Not on file  . Stress: Not on file  Relationships  . Social connections:    Talks on phone: Not on file    Gets together: Not on file    Attends religious service: Not on file    Active member of club or organization: Not on file    Attends meetings of clubs or organizations: Not on file    Relationship status: Not on file  Other Topics Concern  . Not on file  Social History Narrative  . Not on file     Family  History:  The patient's family history includes Diabetes in her father; Heart failure in her mother; Other in her brother.   ROS:   Please see the history of present illness.    ROS All other systems reviewed and are negative.   Physical Exam:    VS:  BP 120/72   Pulse 79   Ht 5\' 3"  (1.6 m)   Wt 159 lb 3.2 oz (72.2 kg)   LMP 08/05/2007   BMI 28.20 kg/m     Wt Readings from Last 3 Encounters:  07/28/18 159 lb 3.2 oz (72.2 kg)  02/20/18 159 lb (72.1 kg)  02/17/18 160 lb 6.4 oz (72.8 kg)    GENERAL:  Well appearing WF in NAD HEENT:  PERRL, EOMI, sclera are clear. Oropharynx is clear. NECK:  No jugular venous distention, carotid upstroke brisk and symmetric, no bruits, no thyromegaly or adenopathy LUNGS:  Clear to auscultation bilaterally CHEST:  Unremarkable HEART:  RRR with extrasystoles,  PMI not displaced or sustained,S1 and S2 within normal limits, no S3, no S4: no clicks, no rubs, no murmurs ABD:  Soft, nontender. BS +, no masses or bruits. No hepatomegaly, no splenomegaly EXT:  2 + pulses throughout, no edema, no cyanosis no clubbing SKIN:  Warm and dry.  No rashes NEURO:  Alert and oriented x 3. Cranial nerves II through XII intact. PSYCH:  Cognitively intact     Studies/Labs Reviewed:   EKG:  EKG is  ordered today.  NSR with frequent unifocal PVCs. Otherwise normal. I have personally reviewed and interpreted this study.   Recent Labs: 02/17/2018: ALT 18; BUN 28; Creatinine, Ser 0.86; Hemoglobin 13.8; Platelets 219; Potassium 3.8; Sodium 141   Recent Lipid Panel    Component Value Date/Time   CHOL 225 (H) 04/16/2016 1002   TRIG 257 (H) 04/16/2016 1002   HDL 56 04/16/2016 1002   CHOLHDL 4.0 04/16/2016 1002   VLDL 51 (H) 04/16/2016 1002   LDLCALC 118 04/16/2016 1002   LDLDIRECT 121.6 01/15/2013 0915   Labs dated 01/16/18: cholesterol 305, triglycerides 461, HDL 56, LDL 157.    Additional studies/ records that were reviewed today include:   Echo  04/16/16 Vigorous LVEF, EF 65-70%, normal wall motion, grade 1 diastolic dysfunction, trivial MR  ETT-Echo 3/08 Normal   Holter monitor 05/25/16 showed occ. PVCs and rare PACs. Arrhythmia burden was quite low with total PVCs of only 452.  ASSESSMENT:    1. PVC (premature ventricular contraction)   2. Essential hypertension   3. Dyslipidemia    PLAN:    In order of problems  listed above:  1. PVCs - by prior Holter monitor she had low burden on metoprolol.  She is asymptomatic. EF is normal on recent Echo. Electrolytes normal. At this point her risk is low. Continue current therapy. I will follow up in one year.  2. HTN - BP well controlled.    3. HLD - history of some myalgias on statin. Recommend weight loss, mediterranean diet and fish oil.    Medication Adjustments/Labs and Tests Ordered: Current medicines are reviewed at length with the patient today.  Concerns regarding medicines are outlined above.  Medication changes, Labs and Tests ordered today are outlined in the Patient Instructions noted below. There are no Patient Instructions on file for this visit.    Signed, Fong Mccarry [place], MD  07/28/2018 3:27 PM    [facility] [address], [place], [place]  [address] Phone: [phone]; Fax: [phone]

## 2018-07-28 ENCOUNTER — Encounter: Payer: Self-pay | Admitting: Cardiology

## 2018-07-28 ENCOUNTER — Ambulatory Visit (INDEPENDENT_AMBULATORY_CARE_PROVIDER_SITE_OTHER): Payer: 59 | Admitting: Cardiology

## 2018-07-28 VITALS — BP 120/72 | HR 79 | Ht 63.0 in | Wt 159.2 lb

## 2018-07-28 DIAGNOSIS — I493 Ventricular premature depolarization: Secondary | ICD-10-CM | POA: Diagnosis not present

## 2018-07-28 DIAGNOSIS — I10 Essential (primary) hypertension: Secondary | ICD-10-CM

## 2018-07-28 DIAGNOSIS — E785 Hyperlipidemia, unspecified: Secondary | ICD-10-CM | POA: Diagnosis not present

## 2018-08-04 ENCOUNTER — Other Ambulatory Visit: Payer: Self-pay | Admitting: Cardiology

## 2018-08-22 ENCOUNTER — Other Ambulatory Visit: Payer: Self-pay | Admitting: *Deleted

## 2018-08-22 DIAGNOSIS — E785 Hyperlipidemia, unspecified: Secondary | ICD-10-CM

## 2018-08-23 ENCOUNTER — Inpatient Hospital Stay: Payer: 59 | Attending: Oncology

## 2018-08-23 ENCOUNTER — Inpatient Hospital Stay (HOSPITAL_BASED_OUTPATIENT_CLINIC_OR_DEPARTMENT_OTHER): Payer: 59 | Admitting: Oncology

## 2018-08-23 ENCOUNTER — Telehealth: Payer: Self-pay | Admitting: Oncology

## 2018-08-23 VITALS — BP 126/83 | HR 78 | Temp 97.9°F | Resp 18 | Ht 63.0 in | Wt 158.1 lb

## 2018-08-23 DIAGNOSIS — J069 Acute upper respiratory infection, unspecified: Secondary | ICD-10-CM

## 2018-08-23 DIAGNOSIS — D242 Benign neoplasm of left breast: Secondary | ICD-10-CM | POA: Insufficient documentation

## 2018-08-23 DIAGNOSIS — C9110 Chronic lymphocytic leukemia of B-cell type not having achieved remission: Secondary | ICD-10-CM | POA: Insufficient documentation

## 2018-08-23 DIAGNOSIS — Z79899 Other long term (current) drug therapy: Secondary | ICD-10-CM

## 2018-08-23 DIAGNOSIS — E785 Hyperlipidemia, unspecified: Secondary | ICD-10-CM

## 2018-08-23 LAB — COMPREHENSIVE METABOLIC PANEL
ALT: 18 U/L (ref 0–44)
AST: 21 U/L (ref 15–41)
Albumin: 4.1 g/dL (ref 3.5–5.0)
Alkaline Phosphatase: 94 U/L (ref 38–126)
Anion gap: 11 (ref 5–15)
BUN: 19 mg/dL (ref 8–23)
CO2: 28 mmol/L (ref 22–32)
Calcium: 9.7 mg/dL (ref 8.9–10.3)
Chloride: 104 mmol/L (ref 98–111)
Creatinine, Ser: 1.01 mg/dL — ABNORMAL HIGH (ref 0.44–1.00)
GFR calc Af Amer: >60 mL/min (ref >60)
GFR calc non Af Amer: 56 mL/min — ABNORMAL LOW (ref >60)
Glucose, Bld: 104 mg/dL — ABNORMAL HIGH (ref 70–99)
Potassium: 3.4 mmol/L — ABNORMAL LOW (ref 3.5–5.1)
Sodium: 143 mmol/L (ref 135–145)
Total Bilirubin: 0.8 mg/dL (ref 0.3–1.2)
Total Protein: 6.9 g/dL (ref 6.5–8.1)

## 2018-08-23 LAB — CBC WITH DIFFERENTIAL (CANCER CENTER ONLY)
Abs Immature Granulocytes: 0.07 10*3/uL (ref 0.00–0.07)
Basophils Absolute: 0.1 10*3/uL (ref 0.0–0.1)
Basophils Relative: 0 %
Eosinophils Absolute: 0.2 10*3/uL (ref 0.0–0.5)
Eosinophils Relative: 1 %
HCT: 40.4 % (ref 36.0–46.0)
Hemoglobin: 13.4 g/dL (ref 12.0–15.0)
Immature Granulocytes: 0 %
Lymphocytes Relative: 71 %
Lymphs Abs: 18.8 10*3/uL — ABNORMAL HIGH (ref 0.7–4.0)
MCH: 32.2 pg (ref 26.0–34.0)
MCHC: 33.2 g/dL (ref 30.0–36.0)
MCV: 97.1 fL (ref 80.0–100.0)
Monocytes Absolute: 2 10*3/uL — ABNORMAL HIGH (ref 0.1–1.0)
Monocytes Relative: 8 %
Neutro Abs: 5.4 10*3/uL (ref 1.7–7.7)
Neutrophils Relative %: 20 %
Platelet Count: 226 10*3/uL (ref 150–400)
RBC: 4.16 MIL/uL (ref 3.87–5.11)
RDW: 12.8 % (ref 11.5–15.5)
WBC Count: 26.6 10*3/uL — ABNORMAL HIGH (ref 4.0–10.5)
nRBC: 0 % (ref 0.0–0.2)

## 2018-08-23 NOTE — Telephone Encounter (Signed)
Gave pt avs and calendar  °

## 2018-08-23 NOTE — Progress Notes (Signed)
Hematology and Oncology Follow Up Visit  Alice Pollard [id number] 28-Sep-1951 67 y.o. 08/23/2018 1:38 PM Alice Pollard, [place] ref. provider found   Principle Diagnosis: 67 year old woman with:  1.  CLL diagnosed in May 2018 after presenting with lymphocytosis and lymphadenopathy diagnosed in May 2018.    2.  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  Excisional biopsy in December 2018 showed CLL involvement and no carcinoma.  Current therapy: Active surveillance.  Interim History: Alice Pollard returns today for a repeat evaluation.  Since the last visit, she reports no major changes or complaints.  He traveled to [place] for vacation purposes and recently returned.  She did report a upper respiratory tract infection with sinus congestion but no fevers or chills.  She does report very small cervical lymph node enlargement after she developed the recent cold.  She denies any excessive fatigue, tiredness or change in her appetite.  Her performance status and activity level remains excellent.  She does not report any headaches, blurry vision, syncope or seizures.  She denies any alteration in mental status or confusion.  She denies any fevers, chills or sweats.  She does not report any chest pain, palpitation, orthopnea or leg edema. She does not report any cough, wheezing or hemoptysis. She is not report any nausea, vomiting or early satiety.  He denies any change in bowel habits. She does not report any frequency urgency or hesitancy. She does not report any arthralgias or myalgias.  She denies any bleeding or clotting tendency. Remaining review of systems is negative.  Medications: I have reviewed the patient's current medications.  Current Outpatient Medications  Medication Sig Dispense Refill  . CALCIUM PO Take 1 tablet by mouth daily. PATIENT NOT SURE OF DOSAGE    . Cetirizine HCl (ZYRTEC PO) Take 1 tablet by mouth daily.     Marland Kitchen FLUoxetine (PROZAC) 20 MG tablet TAKE 1 TABLET(20 MG)  BY MOUTH DAILY 90 tablet 0  . Magnesium Gluconate (MAGNESIUM 27 PO) Take by mouth.    . metoprolol tartrate (LOPRESSOR) 25 MG tablet TAKE 1/2 TABLET(12.5 MG) BY MOUTH TWICE DAILY 30 tablet 0  . Multiple Vitamin (MULTIVITAMIN) tablet Take 1 tablet by mouth daily.    . potassium chloride (K-DUR) 10 MEQ tablet Take 10 mEq by mouth daily.    Marland Kitchen telmisartan-hydrochlorothiazide (MICARDIS HCT) 80-25 MG tablet Take 1 tablet by mouth daily. 90 tablet 1   No current facility-administered medications for this visit.      Allergies:  Allergies  Allergen Reactions  . Codeine Other (See Comments)    Fainted   . Latex Itching and Other (See Comments)    sensitivity    Past Medical History, Surgical history, Social history, and Family History were reviewed and updated.  Physical Exam:  Blood pressure 126/83, pulse 78, temperature 97.9 F (36.6 C), resp. rate 18, height 5\' 3"  (1.6 m), weight 158 lb 1.6 oz (71.7 kg), last menstrual period 08/05/2007, SpO2 98 %.   ECOG: 0   General appearance: Comfortable appearing without any discomfort Head: Normocephalic without any trauma Oropharynx: Mucous membranes are moist and pink without any thrush or ulcers. Eyes: Pupils are equal and round reactive to light. Lymph nodes:  Small cervical lymph node palpated bilaterally.  No supraclavicular, inguinal or axillary lymphadenopathy.   Heart:regular rate and rhythm.  S1 and S2 without leg edema. Lung: Clear without any rhonchi or wheezes.  No dullness to percussion. Abdomin: Soft, nontender, nondistended with good bowel sounds.  No hepatosplenomegaly.  Musculoskeletal: No joint deformity or effusion.  Full range of motion noted. Neurological: No deficits noted on motor, sensory and deep tendon reflex exam. Skin: No petechial rash or dryness.  Appeared moist.     Lab Results: Lab Results  Component Value Date   WBC 23.9 (H) 02/17/2018   HGB 13.8 02/17/2018   HCT 40.7 02/17/2018   MCV 97.1 02/17/2018    PLT 219 02/17/2018     Chemistry      Component Value Date/Time   NA 141 02/17/2018 1302   NA 142 08/19/2017 1410   K 3.8 02/17/2018 1302   K 3.8 08/19/2017 1410   CL 102 02/17/2018 1302   CO2 29 02/17/2018 1302   CO2 29 08/19/2017 1410   BUN 28 (H) 02/17/2018 1302   BUN 19.9 08/19/2017 1410   CREATININE 0.86 02/17/2018 1302   CREATININE 0.8 08/19/2017 1410      Component Value Date/Time   CALCIUM 10.0 02/17/2018 1302   CALCIUM 9.9 08/19/2017 1410   ALKPHOS 96 02/17/2018 1302   ALKPHOS 89 08/19/2017 1410   AST 23 02/17/2018 1302   AST 20 08/19/2017 1410   ALT 18 02/17/2018 1302   ALT 14 08/19/2017 1410   BILITOT 0.6 02/17/2018 1302   BILITOT 0.60 08/19/2017 1410       Impression and Plan:  68 year old woman with:  1.  CLL presented with lymphocytosis and adenopathy in May 2018.  She has been on active surveillance without any indication for treatment.  Her laboratory data and clinical status was updated today and she remains asymptomatic without any indication for treatment.  I disease status was discussed today and treatment options were reviewed.  Indication for treatment was reiterated which include symptomatic lymphadenopathy, cytopenias, organ dysfunction among others.  After discussion today, the plan is to continue with active surveillance.  Her cervical adenopathy will continue to be monitored in future visits.  2. 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  She does have CLL involvement however in the breast but no indication for treatment at this time.  3. Autoimmune cytopenias: Hemoglobin and platelet count remains normal without any sign of autoimmune thrombocytopenia or anemia.  4. Opportunistic infection: None reported at this time.  5. Follow-up: Will be in 6 months.   15  minutes was spent with the patient face-to-face today.  More than 50% of time was spent on reviewing her disease status, treatment options and  indication for treatment.   Alice Button, MD 11/20/20191:38 PM

## 2018-09-18 ENCOUNTER — Other Ambulatory Visit: Payer: Self-pay | Admitting: Obstetrics & Gynecology

## 2018-09-18 NOTE — Telephone Encounter (Signed)
Medication refill request: Prozac 20mg   Last AEX:  02/20/18 Next AEX: 05/25/19 Last MMG (if hormonal medication request):  04/14/17 Left Bx Complex Sclerosing Lesion. CLL Refill authorized: #[id number] with 2 RF

## 2018-10-05 ENCOUNTER — Ambulatory Visit: Payer: 59 | Admitting: Cardiology

## 2018-10-08 ENCOUNTER — Other Ambulatory Visit: Payer: Self-pay | Admitting: Cardiology

## 2018-11-07 ENCOUNTER — Other Ambulatory Visit: Payer: Self-pay | Admitting: Cardiology

## 2018-12-01 NOTE — Telephone Encounter (Signed)
ERROR

## 2019-02-28 ENCOUNTER — Inpatient Hospital Stay (HOSPITAL_BASED_OUTPATIENT_CLINIC_OR_DEPARTMENT_OTHER): Payer: 59 | Admitting: Oncology

## 2019-02-28 ENCOUNTER — Other Ambulatory Visit: Payer: Self-pay

## 2019-02-28 ENCOUNTER — Inpatient Hospital Stay: Payer: 59 | Attending: Oncology

## 2019-02-28 VITALS — BP 130/64 | HR 79 | Temp 97.8°F | Resp 18 | Ht 63.0 in | Wt 153.2 lb

## 2019-02-28 DIAGNOSIS — D242 Benign neoplasm of left breast: Secondary | ICD-10-CM | POA: Diagnosis not present

## 2019-02-28 DIAGNOSIS — C9110 Chronic lymphocytic leukemia of B-cell type not having achieved remission: Secondary | ICD-10-CM | POA: Insufficient documentation

## 2019-02-28 DIAGNOSIS — Z79899 Other long term (current) drug therapy: Secondary | ICD-10-CM | POA: Diagnosis not present

## 2019-02-28 LAB — CBC WITH DIFFERENTIAL (CANCER CENTER ONLY)
Abs Immature Granulocytes: 0.17 10*3/uL — ABNORMAL HIGH (ref 0.00–0.07)
Basophils Absolute: 0 10*3/uL (ref 0.0–0.1)
Basophils Relative: 0 %
Eosinophils Absolute: 0.1 10*3/uL (ref 0.0–0.5)
Eosinophils Relative: 0 %
HCT: 39.9 % (ref 36.0–46.0)
Hemoglobin: 13.1 g/dL (ref 12.0–15.0)
Immature Granulocytes: 0 %
Lymphocytes Relative: 83 %
Lymphs Abs: 32.8 10*3/uL — ABNORMAL HIGH (ref 0.7–4.0)
MCH: 32.8 pg (ref 26.0–34.0)
MCHC: 32.8 g/dL (ref 30.0–36.0)
MCV: 99.8 fL (ref 80.0–100.0)
Monocytes Absolute: 1.8 10*3/uL — ABNORMAL HIGH (ref 0.1–1.0)
Monocytes Relative: 5 %
Neutro Abs: 4.9 10*3/uL (ref 1.7–7.7)
Neutrophils Relative %: 12 %
Platelet Count: 178 10*3/uL (ref 150–400)
RBC: 4 MIL/uL (ref 3.87–5.11)
RDW: 14.2 % (ref 11.5–15.5)
WBC Count: 39.7 10*3/uL — ABNORMAL HIGH (ref 4.0–10.5)
nRBC: 0 % (ref 0.0–0.2)

## 2019-02-28 LAB — CMP (CANCER CENTER ONLY)
ALT: 16 U/L (ref 0–44)
AST: 25 U/L (ref 15–41)
Albumin: 4.2 g/dL (ref 3.5–5.0)
Alkaline Phosphatase: 101 U/L (ref 38–126)
Anion gap: 9 (ref 5–15)
BUN: 18 mg/dL (ref 8–23)
CO2: 29 mmol/L (ref 22–32)
Calcium: 9.5 mg/dL (ref 8.9–10.3)
Chloride: 103 mmol/L (ref 98–111)
Creatinine: 0.79 mg/dL (ref 0.44–1.00)
GFR, Est AFR Am: >60 mL/min (ref >60)
GFR, Estimated: >60 mL/min (ref >60)
Glucose, Bld: 102 mg/dL — ABNORMAL HIGH (ref 70–99)
Potassium: 3.7 mmol/L (ref 3.5–5.1)
Sodium: 141 mmol/L (ref 135–145)
Total Bilirubin: 0.6 mg/dL (ref 0.3–1.2)
Total Protein: 6.9 g/dL (ref 6.5–8.1)

## 2019-02-28 LAB — LACTATE DEHYDROGENASE: LDH: 280 U/L — ABNORMAL HIGH (ref 98–192)

## 2019-02-28 NOTE — Progress Notes (Signed)
Hematology and Oncology Follow Up Visit  Alice Pollard [id number] [birth year] 68 y.o. 02/28/2019 1:17 PM Alice Pollard, MDSouth, Alice Main, MD   Principle Diagnosis: 68 year old woman with:  1.  CLL presented with lymphocytosis and adenopathy in 2018. .    2.  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    Current therapy: Active surveillance.  Interim History: Alice Pollard is here for a follow-up.  Since the last visit, she reports no major changes in her health.  She denies any recent hospitalization or illnesses.  She denies any lymphadenopathy or recent infections.  He denies any masses or lesions.  She denies any constitutional symptoms.  She continues to eat well and perform activities of daily living without any decline.   Patient denied any alteration mental status, neuropathy, confusion or dizziness.  Denies any headaches or lethargy.  Denies any night sweats, weight loss or changes in appetite.  Denied orthopnea, dyspnea on exertion or chest discomfort.  Denies shortness of breath, difficulty breathing hemoptysis or cough.  Denies any abdominal distention, nausea, early satiety or dyspepsia.  Denies any hematuria, frequency, dysuria or nocturia.  Denies any skin irritation, dryness or rash.  Denies any ecchymosis or petechiae.  Denies any lymphadenopathy or clotting.  Denies any heat or cold intolerance.  Denies any anxiety or depression.  Remaining review of system is negative.     Medications: I have reviewed the patient's current medications.  Current Outpatient Medications  Medication Sig Dispense Refill  . CALCIUM PO Take 1 tablet by mouth daily. PATIENT NOT SURE OF DOSAGE    . Cetirizine HCl (ZYRTEC PO) Take 1 tablet by mouth daily.     Marland Kitchen FLUoxetine (PROZAC) 20 MG tablet TAKE 1 TABLET(20 MG) BY MOUTH DAILY 90 tablet 2  . Magnesium Gluconate (MAGNESIUM 27 PO) Take by mouth.    . metoprolol tartrate (LOPRESSOR) 25 MG tablet TAKE 1/2 TABLET(12.5 MG) BY MOUTH TWICE  DAILY 30 tablet 6  . Multiple Vitamin (MULTIVITAMIN) tablet Take 1 tablet by mouth daily.    . potassium chloride (K-DUR) 10 MEQ tablet Take 10 mEq by mouth daily.    Marland Kitchen telmisartan-hydrochlorothiazide (MICARDIS HCT) 80-25 MG tablet TAKE 1 TABLET BY MOUTH DAILY 90 tablet 2   No current facility-administered medications for this visit.      Allergies:  Allergies  Allergen Reactions  . Codeine Other (See Comments)    Fainted   . Latex Itching and Other (See Comments)    sensitivity    Past Medical History, Surgical history, Social history, and Family History were reviewed and updated.  Physical Exam:  Blood pressure 130/64, pulse 79, temperature 97.8 F (36.6 C), temperature source Oral, resp. rate 18, height 5\' 3"  (1.6 m), weight 153 lb 3.2 oz (69.5 kg), last menstrual period 08/05/2007, SpO2 99 %.    ECOG: 0     General appearance: Comfortable appearing without any discomfort Head: Normocephalic without any trauma Oropharynx: Mucous membranes are moist and pink without any thrush or ulcers. Eyes: Pupils are equal and round reactive to light. Lymph nodes: No cervical, supraclavicular, inguinal or axillary lymphadenopathy.   Heart:regular rate and rhythm.  S1 and S2 without leg edema. Lung: Clear without any rhonchi or wheezes.  No dullness to percussion. Abdomin: Soft, nontender, nondistended with good bowel sounds.  No hepatosplenomegaly. Musculoskeletal: No joint deformity or effusion.  Full range of motion noted. Neurological: No deficits noted on motor, sensory and deep tendon reflex exam. Skin: No petechial rash or dryness.  Appeared moist.      Lab Results: Lab Results  Component Value Date   WBC 26.6 (H) 08/23/2018   HGB 13.4 08/23/2018   HCT 40.4 08/23/2018   MCV 97.1 08/23/2018   PLT 226 08/23/2018     Chemistry      Component Value Date/Time   NA 143 08/23/2018 1328   NA 142 08/19/2017 1410   K 3.4 (L) 08/23/2018 1328   K 3.8 08/19/2017 1410   CL  104 08/23/2018 1328   CO2 28 08/23/2018 1328   CO2 29 08/19/2017 1410   BUN 19 08/23/2018 1328   BUN 19.9 08/19/2017 1410   CREATININE 1.01 (H) 08/23/2018 1328   CREATININE 0.8 08/19/2017 1410      Component Value Date/Time   CALCIUM 9.7 08/23/2018 1328   CALCIUM 9.9 08/19/2017 1410   ALKPHOS 94 08/23/2018 1328   ALKPHOS 89 08/19/2017 1410   AST 21 08/23/2018 1328   AST 20 08/19/2017 1410   ALT 18 08/23/2018 1328   ALT 14 08/19/2017 1410   BILITOT 0.8 08/23/2018 1328   BILITOT 0.60 08/19/2017 1410       Impression and Plan:  68 year old woman with:  1.  CLL diagnosed in 2018 after presenting with lymphocytosis and lymphadenopathy.  She remains on active surveillance without indication for treatment.    The natural course of this disease was updated today with the patient.  Treatment indication would include symptomatic lymphadenopathy, rapid increase in her white cell count as well as cytopenias.  Bone marrow involvement as well as constitutional symptoms will be consideration.treatment options were reviewed today which include oral targeted therapy with ibrutinib versus chemotherapy as well as chemoimmunotherapy.  Complication associated with these therapies versus observation and surveillance were debated.  At this time we will continue with active surveillance.  2.  Autoimmune cytopenias: No evidence of autoimmune cytopenias noted at this time.  We will continue to monitor this aspect of her care.  3. Opportunistic infection: I continue to reiterate her risk of infection at this time and the fact that she has CLL.  No noted at this time.  4.  Breast malignancy surveillance: I recommended repeating her mammography in the near future.  She is overdue at this time and I encouraged her to schedule that in the near future.  5. Follow-up: Will be in 6 months.  25  minutes was spent with the patient face-to-face today.  More than 50% of time was dedicated to reviewing her  laboratory data, disease status update and indication for treatment discussion.   Zola Button, MD 5/27/20201:17 PM

## 2019-03-01 ENCOUNTER — Telehealth: Payer: Self-pay | Admitting: Oncology

## 2019-03-01 NOTE — Telephone Encounter (Signed)
Called regarding schedule °

## 2019-03-02 ENCOUNTER — Other Ambulatory Visit: Payer: Self-pay | Admitting: Obstetrics & Gynecology

## 2019-03-02 DIAGNOSIS — Z1231 Encounter for screening mammogram for malignant neoplasm of breast: Secondary | ICD-10-CM

## 2019-04-18 ENCOUNTER — Ambulatory Visit
Admission: RE | Admit: 2019-04-18 | Discharge: 2019-04-18 | Disposition: A | Discharge status: Home / Self Care | Payer: 59 | Source: Ambulatory Visit | Attending: Obstetrics & Gynecology | Admitting: Obstetrics & Gynecology

## 2019-04-18 ENCOUNTER — Other Ambulatory Visit: Payer: Self-pay

## 2019-04-18 DIAGNOSIS — Z1231 Encounter for screening mammogram for malignant neoplasm of breast: Secondary | ICD-10-CM

## 2019-04-20 ENCOUNTER — Other Ambulatory Visit: Payer: Self-pay | Admitting: Obstetrics & Gynecology

## 2019-04-20 DIAGNOSIS — R928 Other abnormal and inconclusive findings on diagnostic imaging of breast: Secondary | ICD-10-CM

## 2019-04-24 ENCOUNTER — Other Ambulatory Visit: Payer: Self-pay

## 2019-04-24 ENCOUNTER — Ambulatory Visit
Admission: RE | Admit: 2019-04-24 | Discharge: 2019-04-24 | Disposition: A | Discharge status: Home / Self Care | Payer: 59 | Source: Ambulatory Visit | Attending: Obstetrics & Gynecology | Admitting: Obstetrics & Gynecology

## 2019-04-24 ENCOUNTER — Other Ambulatory Visit: Payer: Self-pay | Admitting: Obstetrics & Gynecology

## 2019-04-24 DIAGNOSIS — R928 Other abnormal and inconclusive findings on diagnostic imaging of breast: Secondary | ICD-10-CM

## 2019-05-23 ENCOUNTER — Other Ambulatory Visit: Payer: Self-pay

## 2019-05-25 ENCOUNTER — Other Ambulatory Visit: Payer: Self-pay

## 2019-05-25 ENCOUNTER — Encounter: Payer: Self-pay | Admitting: Obstetrics & Gynecology

## 2019-05-25 ENCOUNTER — Ambulatory Visit (INDEPENDENT_AMBULATORY_CARE_PROVIDER_SITE_OTHER): Payer: 59 | Admitting: Obstetrics & Gynecology

## 2019-05-25 VITALS — BP 112/80 | HR 80 | Temp 97.3°F | Ht 62.0 in | Wt 142.0 lb

## 2019-05-25 DIAGNOSIS — Z01419 Encounter for gynecological examination (general) (routine) without abnormal findings: Secondary | ICD-10-CM | POA: Diagnosis not present

## 2019-05-25 NOTE — Progress Notes (Signed)
68 y.o. G3P3 Married White or Caucasian female here for annual exam.  Had CLL and follow up every six months.    Daughter, Rolena Infante, has a son.  His name is Hank and she thinks he is so cute and cuddly.    Denies vaginal bleeding.   Started on Wellbutrin this year.  She is on the XL 150mg  dosage.    Patient's last menstrual period was 08/05/2007.          Sexually active: Yes.    The current method of family planning is post menopausal status.    Exercising: Yes.    golf Smoker:  no  Health Maintenance: Pap:  10/28/16 Neg. HR HPV:neg History of abnormal Pap:  no MMG:  04/24/19 [place] Axilla Right and Left. BIRADS1:neg. F/u 1 year  Colonoscopy:  01/2017 Polyps.  F/u 5 years. BMD:   10/20/12 Normal.  Plan with next MMG.   TDaP:  02/07/2019 Pneumonia vaccine(s):  No Shingrix:   No Hep C testing: unsure  Screening Labs: PCP.  Had appt scheduled in April.  She will plan appt next year.    reports that she has never smoked. She has never used smokeless tobacco. She reports previous alcohol use. She reports that she does not use drugs.  Past Medical History:  Diagnosis Date  . ADD (attention deficit disorder)   . CLL (chronic lymphocytic leukemia) ([place])   . Dyslipidemia   . Endometriosis   . Hematuria    h/o asymptomatic  . HTN (hypertension)   . PONV (postoperative nausea and vomiting)     Past Surgical History:  Procedure Laterality Date  . LAPAROSCOPY     endometriosis  . RADIOACTIVE SEED GUIDED EXCISIONAL BREAST BIOPSY Left 09/13/2017   Procedure: RADIOACTIVE SEED GUIDED EXCISIONAL BREAST BIOPSY;  Surgeon: Rolm Bookbinder, MD;  Location: [facility];  Service: General;  Laterality: Left;    Current Outpatient Medications  Medication Sig Dispense Refill  . buPROPion (WELLBUTRIN XL) 150 MG 24 hr tablet Take 1 tablet by mouth daily.    Marland Kitchen CALCIUM PO Take 1 tablet by mouth daily. PATIENT NOT SURE OF DOSAGE    . Cetirizine HCl (ZYRTEC PO) Take 1 tablet by mouth daily.      Marland Kitchen FLUoxetine (PROZAC) 20 MG tablet TAKE 1 TABLET(20 MG) BY MOUTH DAILY 90 tablet 2  . Magnesium Gluconate (MAGNESIUM 27 PO) Take by mouth.    . metoprolol tartrate (LOPRESSOR) 25 MG tablet TAKE 1/2 TABLET(12.5 MG) BY MOUTH TWICE DAILY 30 tablet 6  . Multiple Vitamin (MULTIVITAMIN) tablet Take 1 tablet by mouth daily.    . potassium chloride (K-DUR) 10 MEQ tablet Take 10 mEq by mouth daily.    Marland Kitchen telmisartan-hydrochlorothiazide (MICARDIS HCT) 80-25 MG tablet TAKE 1 TABLET BY MOUTH DAILY 90 tablet 2   No current facility-administered medications for this visit.     Family History  Problem Relation Age of Onset  . Heart failure Mother   . Diabetes Father   . Other Brother        bilateral bladder  . Colon cancer Neg Hx     Review of Systems  All other systems reviewed and are negative.   Exam:   BP 112/80   Pulse 80   Temp (!) 97.3 F (36.3 C) (Temporal)   Ht 5\' 2"  (1.575 m)   Wt 142 lb (64.4 kg)   LMP 08/05/2007   BMI 25.97 kg/m   Height: 5\' 2"  (157.5 cm)  Ht Readings from  Last 3 Encounters:  05/25/19 5\' 2"  (1.575 m)  02/28/19 5\' 3"  (1.6 m)  08/23/18 5\' 3"  (1.6 m)   General appearance: alert, cooperative and appears stated age Head: Normocephalic, without obvious abnormality, atraumatic Neck: no adenopathy, supple, symmetrical, trachea midline and thyroid normal to inspection and palpation Lungs: clear to auscultation bilaterally Breasts: normal appearance, no masses or tenderness Heart: regular rate and rhythm Abdomen: soft, non-tender; bowel sounds normal; no masses,  no organomegaly Extremities: extremities normal, atraumatic, no cyanosis or edema Skin: Skin color, texture, turgor normal. No rashes or lesions Lymph nodes: Cervical, supraclavicular, and axillary nodes normal. No abnormal inguinal nodes palpated Neurologic: Grossly normal   Pelvic: External genitalia:  no lesions              Urethra:  normal appearing urethra with no masses, tenderness or  lesions              Bartholins and Skenes: normal                 Vagina: normal appearing vagina with normal color and discharge, no lesions              Cervix: no lesions              Pap taken: No. Bimanual Exam:  Uterus:  normal size, contour, position, consistency, mobility, non-tender              Adnexa: normal adnexa and no mass, fullness, tenderness               Rectovaginal: Confirms               Anus:  normal sphincter tone, no lesions  Chaperone was present for exam.  A:  Well Woman with normal exam PMP, no HRT Hypertension CLL, followed by Dr. Alen Blew H/o sclerosing breast lesion  P:   Mammogram guiddlines reviewed. pap smear neg 2018.  Not indicated today Colonoscopy due 2023 Having prevnar vaccination in a month Lab work is done with Dr. Forde Dandy.  She will plan this follow up BMD will be planned with MMG. Return annually or prn

## 2019-05-25 NOTE — Patient Instructions (Signed)
Plan bone density with mammogram next year.

## 2019-08-02 NOTE — Progress Notes (Signed)
Cardiology Office Note:    Date:  08/06/2019   ID:  Alice Pollard, DOB 02-14-1951, MRN [id number]  PCP:  [name], MD  Cardiologist:  Dr. Peter Martinique   Electrophysiologist:  n/a  Referring MD: [name], MD   Chief Complaint  Patient presents with  . Palpitations    History of Present Illness:    Alice Pollard is a 68 y.o. female with a hx of HTN, HL, PVCs.  Seen on 03/29/16 for annual FU.  Her ECG demonstrated asymptomatic PVCs in a bigeminal pattern. Echocardiogram demonstrated normal LV function. Patient was placed on Metoprolol tartrate 12.5 bid.    Seen by Richardson Dopp PAC in July 2017.  She never started the Metoprolol. This was resumed and a follow up Holter done in August. This showed PACs and PVCs with low arrhythmia burden with only 452 PVCs in 24 hours.   She has been diagnosed with stage 1 CLL and is followed by oncology.  On follow up today she is doing very well. Denies any chest pain, palpitations, dizziness or SOB. Tolerating medication well.   Past Medical History:  Diagnosis Date  . ADD (attention deficit disorder)   . CLL (chronic lymphocytic leukemia) ([place])   . Dyslipidemia   . Endometriosis   . Hematuria    h/o asymptomatic  . HTN (hypertension)   . PONV (postoperative nausea and vomiting)     Past Surgical History:  Procedure Laterality Date  . LAPAROSCOPY     endometriosis  . RADIOACTIVE SEED GUIDED EXCISIONAL BREAST BIOPSY Left 09/13/2017   Procedure: RADIOACTIVE SEED GUIDED EXCISIONAL BREAST BIOPSY;  Surgeon: Rolm Bookbinder, MD;  Location: [facility];  Service: General;  Laterality: Left;    Current Medications: Outpatient Medications Prior to Visit  Medication Sig Dispense Refill  . buPROPion (WELLBUTRIN XL) 150 MG 24 hr tablet Take 1 tablet by mouth daily.    Marland Kitchen CALCIUM PO Take 1 tablet by mouth daily. PATIENT NOT SURE OF DOSAGE    . Cetirizine HCl (ZYRTEC PO) Take 1 tablet by mouth daily.     Marland Kitchen  FLUoxetine (PROZAC) 20 MG tablet TAKE 1 TABLET(20 MG) BY MOUTH DAILY 90 tablet 2  . Magnesium Gluconate (MAGNESIUM 27 PO) Take by mouth.    . Multiple Vitamin (MULTIVITAMIN) tablet Take 1 tablet by mouth daily.    . potassium chloride (K-DUR) 10 MEQ tablet Take 10 mEq by mouth daily.    . metoprolol tartrate (LOPRESSOR) 25 MG tablet TAKE 1/2 TABLET(12.5 MG) BY MOUTH TWICE DAILY 30 tablet 6  . telmisartan-hydrochlorothiazide (MICARDIS HCT) 80-25 MG tablet TAKE 1 TABLET BY MOUTH DAILY 90 tablet 2   No facility-administered medications prior to visit.       Allergies:   Codeine and Latex   Social History   Socioeconomic History  . Marital status: Married    Spouse name: Not on file  . Number of children: 3  . Years of education: Not on file  . Highest education level: Not on file  Occupational History  . Occupation: Journalist, newspaper  Social Needs  . Financial resource strain: Not on file  . Food insecurity    Worry: Not on file    Inability: Not on file  . Transportation needs    Medical: Not on file    Non-medical: Not on file  Tobacco Use  . Smoking status: Never Smoker  . Smokeless tobacco: Never Used  Substance and Sexual Activity  . Alcohol use: Not Currently  .  Drug use: No  . Sexual activity: Yes    Partners: Male    Birth control/protection: Post-menopausal    Comment: vasectomy  Lifestyle  . Physical activity    Days per week: Not on file    Minutes per session: Not on file  . Stress: Not on file  Relationships  . Social Herbalist on phone: Not on file    Gets together: Not on file    Attends religious service: Not on file    Active member of club or organization: Not on file    Attends meetings of clubs or organizations: Not on file    Relationship status: Not on file  Other Topics Concern  . Not on file  Social History Narrative  . Not on file     Family History:  The patient's family history includes Diabetes in her father; Heart failure  in her mother; Other in her brother.   ROS:   Please see the history of present illness.    ROS All other systems reviewed and are negative.   Physical Exam:    VS:  BP 110/80   Pulse 72   Ht 5\' 3"  (1.6 m)   Wt 141 lb 3.2 oz (64 kg)   LMP 08/05/2007   SpO2 96%   BMI 25.01 kg/m     Wt Readings from Last 3 Encounters:  08/06/19 141 lb 3.2 oz (64 kg)  05/25/19 142 lb (64.4 kg)  02/28/19 153 lb 3.2 oz (69.5 kg)    GENERAL:  Well appearing WF in NAD HEENT:  PERRL, EOMI, sclera are clear. Oropharynx is clear. NECK:  No jugular venous distention, carotid upstroke brisk and symmetric, no bruits, no thyromegaly or adenopathy LUNGS:  Clear to auscultation bilaterally CHEST:  Unremarkable HEART:  RRR with extrasystoles,  PMI not displaced or sustained,S1 and S2 within normal limits, no S3, no S4: no clicks, no rubs, no murmurs ABD:  Soft, nontender. BS +, no masses or bruits. No hepatomegaly, no splenomegaly EXT:  2 + pulses throughout, no edema, no cyanosis no clubbing SKIN:  Warm and dry.  No rashes NEURO:  Alert and oriented x 3. Cranial nerves II through XII intact. PSYCH:  Cognitively intact     Studies/Labs Reviewed:   EKG:  EKG is  ordered today.  NSR with rate 72. Normal.  I have personally reviewed and interpreted this study.   Recent Labs: 02/28/2019: ALT 16; BUN 18; Creatinine 0.79; Hemoglobin 13.1; Platelet Count 178; Potassium 3.7; Sodium 141   Recent Lipid Panel    Component Value Date/Time   CHOL 225 (H) 04/16/2016 1002   TRIG 257 (H) 04/16/2016 1002   HDL 56 04/16/2016 1002   CHOLHDL 4.0 04/16/2016 1002   VLDL 51 (H) 04/16/2016 1002   LDLCALC 118 04/16/2016 1002   LDLDIRECT 121.6 01/15/2013 0915   Labs dated 01/16/18: cholesterol 305, triglycerides 461, HDL 56, LDL 157.    Additional studies/ records that were reviewed today include:   Echo 04/16/16 Vigorous LVEF, EF 65-70%, normal wall motion, grade 1 diastolic dysfunction, trivial MR  ETT-Echo 3/08  Normal   Holter monitor 05/25/16 showed occ. PVCs and rare PACs. Arrhythmia burden was quite low with total PVCs of only 452.  ASSESSMENT:    1. PVC (premature ventricular contraction)   2. Essential hypertension    PLAN:    In order of problems listed above:  1. PVCs - by prior Holter monitor she had low burden on metoprolol.  She is asymptomatic. EF is normal on recent Echo. Electrolytes normal.  Continue current therapy. Rx refilled.  I will follow up in one year.  2. HTN - BP well controlled.      Medication Adjustments/Labs and Tests Ordered: Current medicines are reviewed at length with the patient today.  Concerns regarding medicines are outlined above.  Medication changes, Labs and Tests ordered today are outlined in the Patient Instructions noted below. There are no Patient Instructions on file for this visit.    Signed, Peter Martinique, MD  08/06/2019 11:16 AM    [facility] [address], [place], [place]  [address] Phone: [phone]; Fax: [phone]

## 2019-08-06 ENCOUNTER — Other Ambulatory Visit: Payer: Self-pay

## 2019-08-06 ENCOUNTER — Ambulatory Visit (INDEPENDENT_AMBULATORY_CARE_PROVIDER_SITE_OTHER): Payer: 59 | Admitting: Cardiology

## 2019-08-06 ENCOUNTER — Encounter: Payer: Self-pay | Admitting: Cardiology

## 2019-08-06 VITALS — BP 110/80 | HR 72 | Ht 63.0 in | Wt 141.2 lb

## 2019-08-06 DIAGNOSIS — I493 Ventricular premature depolarization: Secondary | ICD-10-CM | POA: Diagnosis not present

## 2019-08-06 DIAGNOSIS — I10 Essential (primary) hypertension: Secondary | ICD-10-CM | POA: Diagnosis not present

## 2019-08-06 MED ORDER — TELMISARTAN-HCTZ 80-25 MG PO TABS: 1.0000 | ORAL_TABLET | Freq: Every day | ORAL | 3 refills | Status: DC

## 2019-08-06 MED ORDER — METOPROLOL TARTRATE 25 MG PO TABS: 12.5000 mg | ORAL_TABLET | Freq: Two times a day (BID) | ORAL | 3 refills | Status: DC

## 2019-08-11 ENCOUNTER — Other Ambulatory Visit: Payer: Self-pay | Admitting: Cardiology

## 2019-09-07 ENCOUNTER — Telehealth: Payer: Self-pay | Admitting: Pharmacist

## 2019-09-07 ENCOUNTER — Other Ambulatory Visit: Payer: Self-pay

## 2019-09-07 ENCOUNTER — Inpatient Hospital Stay: Payer: 59 | Attending: Oncology | Admitting: Oncology

## 2019-09-07 ENCOUNTER — Inpatient Hospital Stay: Payer: 59

## 2019-09-07 VITALS — BP 144/79 | HR 71 | Temp 98.0°F | Resp 18 | Ht 63.0 in | Wt 140.5 lb

## 2019-09-07 DIAGNOSIS — C9110 Chronic lymphocytic leukemia of B-cell type not having achieved remission: Secondary | ICD-10-CM

## 2019-09-07 DIAGNOSIS — Z79899 Other long term (current) drug therapy: Secondary | ICD-10-CM | POA: Diagnosis not present

## 2019-09-07 LAB — CBC WITH DIFFERENTIAL (CANCER CENTER ONLY)
Abs Immature Granulocytes: 0.22 10*3/uL — ABNORMAL HIGH (ref 0.00–0.07)
Basophils Absolute: 0 10*3/uL (ref 0.0–0.1)
Basophils Relative: 0 %
Eosinophils Absolute: 0.1 10*3/uL (ref 0.0–0.5)
Eosinophils Relative: 0 %
HCT: 37.5 % (ref 36.0–46.0)
Hemoglobin: 12 g/dL (ref 12.0–15.0)
Immature Granulocytes: 0 %
Lymphocytes Relative: 87 %
Lymphs Abs: 77.2 10*3/uL — ABNORMAL HIGH (ref 0.7–4.0)
MCH: 31.7 pg (ref 26.0–34.0)
MCHC: 32 g/dL (ref 30.0–36.0)
MCV: 99.2 fL (ref 80.0–100.0)
Monocytes Absolute: 6.2 10*3/uL — ABNORMAL HIGH (ref 0.1–1.0)
Monocytes Relative: 7 %
Neutro Abs: 5.6 10*3/uL (ref 1.7–7.7)
Neutrophils Relative %: 6 %
Platelet Count: 166 10*3/uL (ref 150–400)
RBC: 3.78 MIL/uL — ABNORMAL LOW (ref 3.87–5.11)
RDW: 13.6 % (ref 11.5–15.5)
WBC Count: 89.4 10*3/uL (ref 4.0–10.5)
nRBC: 0 % (ref 0.0–0.2)

## 2019-09-07 LAB — CMP (CANCER CENTER ONLY)
ALT: 81 U/L — ABNORMAL HIGH (ref 0–44)
AST: 61 U/L — ABNORMAL HIGH (ref 15–41)
Albumin: 4.2 g/dL (ref 3.5–5.0)
Alkaline Phosphatase: 117 U/L (ref 38–126)
Anion gap: 10 (ref 5–15)
BUN: 15 mg/dL (ref 8–23)
CO2: 29 mmol/L (ref 22–32)
Calcium: 9.3 mg/dL (ref 8.9–10.3)
Chloride: 104 mmol/L (ref 98–111)
Creatinine: 1.02 mg/dL — ABNORMAL HIGH (ref 0.44–1.00)
GFR, Est AFR Am: >60 mL/min (ref >60)
GFR, Estimated: 56 mL/min — ABNORMAL LOW (ref >60)
Glucose, Bld: 90 mg/dL (ref 70–99)
Potassium: 3.9 mmol/L (ref 3.5–5.1)
Sodium: 143 mmol/L (ref 135–145)
Total Bilirubin: 0.4 mg/dL (ref 0.3–1.2)
Total Protein: 6.5 g/dL (ref 6.5–8.1)

## 2019-09-07 MED ORDER — IBRUTINIB 420 MG PO TABS: 420.0000 mg | ORAL_TABLET | Freq: Every day | ORAL | 1 refills | Status: DC

## 2019-09-07 NOTE — Telephone Encounter (Signed)
Oral Oncology Pharmacist Encounter  Received new prescription for Imbruvica (ibrutinib) for the treatment of CLL, planned duration until disease progression or unacceptable drug toxicity.  CMP/CBC from 09/07/2019 assessed, no relevant lab abnormalities. Prescription dose and frequency assessed.   Current medication list in Epic reviewed, one DDIs with ibrutinib identified: -Fluoxetine: Ibrutinib may enhance the antiplatelet effects of fluoxetine. No baseline dose adjustment needed. Monitor patient for s/sx of bleeding.  Prescription has been e-scribed to the [facility] for benefits analysis and approval.  Oral Oncology Clinic will continue to follow for insurance authorization, copayment issues, initial counseling and start date.  Darl Pikes, PharmD, BCPS, [facility] Hematology/Oncology Clinical Pharmacist [facility] [phone]  09/07/2019 2:49 PM

## 2019-09-07 NOTE — Progress Notes (Signed)
Hematology and Oncology Follow Up Visit  ARILYNN WEINRICH [id number] June 15, 1951 68 y.o. 09/07/2019 1:04 PM [name], MDSouth, [name], MD   Principle Diagnosis: 68 year old woman with:  1.  CLL diagnosed in 2018.  She presented with lymphocytosis and adenopathy without any indication for treatment at this time.  2.  Left breast fibroadenoma diagnosed in 2018.  Status post surgical resection without any need for any additional therapy.   Current therapy: Active surveillance.  Interim History: Ms. Deshaies is here for a follow-up evaluation.  Since the last visit, he reports no major changes in her health.  She denies any recent illnesses or hospitalizations.  She denies any abdominal pain or early satiety.  She denies any lymphadenopathy or constitutional symptoms.  Continues to be active and attends to activities of daily living.  She denied headaches, blurry vision, syncope or seizures.  Denies any fevers, chills or sweats.  Denied chest pain, palpitation, orthopnea or leg edema.  Denied cough, wheezing or hemoptysis.  Denied nausea, vomiting or abdominal pain.  Denies any constipation or diarrhea.  Denies any frequency urgency or hesitancy.  Denies any arthralgias or myalgias.  Denies any skin rashes or lesions.  Denies any bleeding or clotting tendency.  Denies any easy bruising.  Denies any hair or nail changes.  Denies any anxiety or depression.  Remaining review of system is negative.       Medications: Updated without any changes. Current Outpatient Medications  Medication Sig Dispense Refill  . buPROPion (WELLBUTRIN XL) 150 MG 24 hr tablet Take 1 tablet by mouth daily.    Marland Kitchen CALCIUM PO Take 1 tablet by mouth daily. PATIENT NOT SURE OF DOSAGE    . Cetirizine HCl (ZYRTEC PO) Take 1 tablet by mouth daily.     Marland Kitchen FLUoxetine (PROZAC) 20 MG tablet TAKE 1 TABLET(20 MG) BY MOUTH DAILY 90 tablet 2  . Magnesium Gluconate (MAGNESIUM 27 PO) Take by mouth.    . metoprolol tartrate  (LOPRESSOR) 25 MG tablet Take 0.5 tablets (12.5 mg total) by mouth 2 (two) times daily. 90 tablet 3  . Multiple Vitamin (MULTIVITAMIN) tablet Take 1 tablet by mouth daily.    . potassium chloride (K-DUR) 10 MEQ tablet Take 10 mEq by mouth daily.    Marland Kitchen telmisartan-hydrochlorothiazide (MICARDIS HCT) 80-25 MG tablet Take 1 tablet by mouth daily. 90 tablet 3   No current facility-administered medications for this visit.      Allergies:  Allergies  Allergen Reactions  . Codeine Other (See Comments)    Fainted   . Latex Itching and Other (See Comments)    sensitivity    Past Medical History, Surgical history, Social history, and Family History unchanged on review.  Physical Exam:   Blood pressure (!) 144/79, pulse 71, temperature 98 F (36.7 C), temperature source Temporal, resp. rate 18, height 5\' 3"  (1.6 m), weight 140 lb 8 oz (63.7 kg), last menstrual period 08/05/2007, SpO2 98 %.    ECOG: 0    General appearance: Alert, awake without any distress. Head: Atraumatic without abnormalities Oropharynx: Without any thrush or ulcers. Eyes: No scleral icterus. Lymph nodes: No lymphadenopathy noted in the cervical, supraclavicular, or axillary nodes Heart:regular rate and rhythm, without any murmurs or gallops.   Lung: Clear to auscultation without any rhonchi, wheezes or dullness to percussion. Abdomin: Soft, nontender without any shifting dullness or ascites. Musculoskeletal: No clubbing or cyanosis. Neurological: No motor or sensory deficits. Skin: No rashes or lesions.      Lab Results:  Lab Results  Component Value Date   WBC 39.7 (H) 02/28/2019   HGB 13.1 02/28/2019   HCT 39.9 02/28/2019   MCV 99.8 02/28/2019   PLT 178 02/28/2019     Chemistry      Component Value Date/Time   NA 141 02/28/2019 1303   NA 142 08/19/2017 1410   K 3.7 02/28/2019 1303   K 3.8 08/19/2017 1410   CL 103 02/28/2019 1303   CO2 29 02/28/2019 1303   CO2 29 08/19/2017 1410   BUN 18  02/28/2019 1303   BUN 19.9 08/19/2017 1410   CREATININE 0.79 02/28/2019 1303   CREATININE 0.8 08/19/2017 1410      Component Value Date/Time   CALCIUM 9.5 02/28/2019 1303   CALCIUM 9.9 08/19/2017 1410   ALKPHOS 101 02/28/2019 1303   ALKPHOS 89 08/19/2017 1410   AST 25 02/28/2019 1303   AST 20 08/19/2017 1410   ALT 16 02/28/2019 1303   ALT 14 08/19/2017 1410   BILITOT 0.6 02/28/2019 1303   BILITOT 0.60 08/19/2017 1410       Impression and Plan:  68 year old woman with:  1.  Stage I CLL presented with the lymphocytosis and adenopathy and has not required treatment since 2018.   The natural course of this disease as well as indication for treatment were reiterated.  Treatment options were also reviewed including oral targeted therapy with ibrutinib versus systemic chemotherapy.  She has experienced a rapid rise and white cell count although her hemoglobin and platelet count remained unchanged.    Risks and benefits of starting ibrutinib.  Potential complications such as nausea, fatigue, bleeding as well as cardiac arrhythmia were reviewed at this time.  Given the rapid rise in her counts have not favor starting treatment sooner rather than later.  I will update her staging with a CT scan in the near future prior to proceeding with therapy.  2.  Autoimmune considerations: She is at risk of developing anemia and thrombocytopenia related to autoimmune etiologies.  Hemoglobin and platelet count remains adequate.  3. Opportunistic infection: No evidence of any infections at this time.  I continue to educate her about the risk related to that.  4.  Breast malignancy surveillance: Status post surgical resection with a left breast lumpectomy with fibroadenoma.  She is currently on active surveillance.  5. Follow-up: Will be in the next 4 weeks for repeat evaluation after starting ibrutinib.  25  minutes was spent with the patient face-to-face today.  More than 50% of time was spent on  reviewing her disease status, treatment options as well as indication for treatment.   Zola Button, MD 12/4/20201:04 PM

## 2019-09-10 ENCOUNTER — Telehealth: Payer: Self-pay | Admitting: Oncology

## 2019-09-10 MED ORDER — IBRUTINIB 420 MG PO TABS: 420.0000 mg | ORAL_TABLET | Freq: Every day | ORAL | 1 refills | Status: DC

## 2019-09-10 NOTE — Telephone Encounter (Signed)
Oral Chemotherapy Pharmacist Encounter  Due to insurance restriction the medication could not be filled at [facility]. Prescription has been e-scribed to [facility]  Supportive information was faxed to [place]. We will continue to follow medication access.   Darl Pikes, PharmD, BCPS, [facility] Hematology/Oncology Clinical Pharmacist [facility] [phone]  09/10/2019 3:47 PM

## 2019-09-10 NOTE — Telephone Encounter (Signed)
Scheduled appt per 12/4 los.  Was leaving a vm but the phone was picked up and then hung up.

## 2019-09-13 ENCOUNTER — Ambulatory Visit (HOSPITAL_COMMUNITY)
Admission: RE | Admit: 2019-09-13 | Discharge: 2019-09-13 | Disposition: A | Discharge status: Home / Self Care | Payer: 59 | Source: Ambulatory Visit | Attending: Oncology | Admitting: Oncology

## 2019-09-13 ENCOUNTER — Encounter (HOSPITAL_COMMUNITY): Payer: Self-pay

## 2019-09-13 ENCOUNTER — Ambulatory Visit (HOSPITAL_COMMUNITY): Payer: 59

## 2019-09-13 ENCOUNTER — Other Ambulatory Visit: Payer: Self-pay

## 2019-09-13 DIAGNOSIS — C9110 Chronic lymphocytic leukemia of B-cell type not having achieved remission: Secondary | ICD-10-CM | POA: Diagnosis not present

## 2019-09-13 MED ORDER — SODIUM CHLORIDE (PF) 0.9 % IJ SOLN
INTRAMUSCULAR | Status: AC
  Filled 2019-09-13: qty 50

## 2019-09-13 MED ORDER — IOHEXOL 300 MG/ML  SOLN
100.0000 mL | Freq: Once | INTRAMUSCULAR | Status: AC | PRN
  Administered 2019-09-13: 100 mL via INTRAVENOUS

## 2019-10-17 ENCOUNTER — Telehealth: Payer: Self-pay

## 2019-10-17 ENCOUNTER — Inpatient Hospital Stay (HOSPITAL_BASED_OUTPATIENT_CLINIC_OR_DEPARTMENT_OTHER): Payer: 59 | Admitting: Oncology

## 2019-10-17 ENCOUNTER — Inpatient Hospital Stay: Payer: 59 | Attending: Oncology

## 2019-10-17 ENCOUNTER — Encounter: Payer: Self-pay | Admitting: Oncology

## 2019-10-17 ENCOUNTER — Other Ambulatory Visit: Payer: Self-pay

## 2019-10-17 VITALS — BP 112/74 | HR 70 | Temp 97.8°F | Resp 17 | Ht 63.0 in | Wt 140.0 lb

## 2019-10-17 DIAGNOSIS — C9110 Chronic lymphocytic leukemia of B-cell type not having achieved remission: Secondary | ICD-10-CM

## 2019-10-17 DIAGNOSIS — R590 Localized enlarged lymph nodes: Secondary | ICD-10-CM | POA: Diagnosis not present

## 2019-10-17 DIAGNOSIS — Z79899 Other long term (current) drug therapy: Secondary | ICD-10-CM | POA: Diagnosis not present

## 2019-10-17 LAB — CBC WITH DIFFERENTIAL (CANCER CENTER ONLY)
Abs Immature Granulocytes: 0.73 10*3/uL — ABNORMAL HIGH (ref 0.00–0.07)
Basophils Absolute: 0.1 10*3/uL (ref 0.0–0.1)
Basophils Relative: 0 %
Eosinophils Absolute: 0.1 10*3/uL (ref 0.0–0.5)
Eosinophils Relative: 0 %
HCT: 37.8 % (ref 36.0–46.0)
Hemoglobin: 11.7 g/dL — ABNORMAL LOW (ref 12.0–15.0)
Immature Granulocytes: 0 %
Lymphocytes Relative: 94 %
Lymphs Abs: 164.5 10*3/uL — ABNORMAL HIGH (ref 0.7–4.0)
MCH: 30.9 pg (ref 26.0–34.0)
MCHC: 31 g/dL (ref 30.0–36.0)
MCV: 99.7 fL (ref 80.0–100.0)
Monocytes Absolute: 1.8 10*3/uL — ABNORMAL HIGH (ref 0.1–1.0)
Monocytes Relative: 1 %
Neutro Abs: 8.6 10*3/uL — ABNORMAL HIGH (ref 1.7–7.7)
Neutrophils Relative %: 5 %
Platelet Count: 234 10*3/uL (ref 150–400)
RBC: 3.79 MIL/uL — ABNORMAL LOW (ref 3.87–5.11)
RDW: 13.2 % (ref 11.5–15.5)
WBC Count: 175.8 10*3/uL (ref 4.0–10.5)
nRBC: 0 % (ref 0.0–0.2)

## 2019-10-17 LAB — CMP (CANCER CENTER ONLY)
ALT: 15 U/L (ref 0–44)
AST: 20 U/L (ref 15–41)
Albumin: 4.1 g/dL (ref 3.5–5.0)
Alkaline Phosphatase: 109 U/L (ref 38–126)
Anion gap: 12 (ref 5–15)
BUN: 18 mg/dL (ref 8–23)
CO2: 26 mmol/L (ref 22–32)
Calcium: 8.9 mg/dL (ref 8.9–10.3)
Chloride: 104 mmol/L (ref 98–111)
Creatinine: 1.03 mg/dL — ABNORMAL HIGH (ref 0.44–1.00)
GFR, Est AFR Am: >60 mL/min (ref >60)
GFR, Estimated: 56 mL/min — ABNORMAL LOW (ref >60)
Glucose, Bld: 95 mg/dL (ref 70–99)
Potassium: 3.6 mmol/L (ref 3.5–5.1)
Sodium: 142 mmol/L (ref 135–145)
Total Bilirubin: 0.5 mg/dL (ref 0.3–1.2)
Total Protein: 6.3 g/dL — ABNORMAL LOW (ref 6.5–8.1)

## 2019-10-17 NOTE — Telephone Encounter (Signed)
Received a call from [facility] in the lab with a critical WBC of 175.8. Dr. Alen Blew made aware and no further instruction received at this time.

## 2019-10-17 NOTE — Progress Notes (Signed)
Hematology and Oncology Follow Up Visit  Alice Pollard [id number] [birth year] 69 y.o. 10/17/2019 3:12 PM [name], MDSouth, [name], MD   Principle Diagnosis: 69 year old woman with stage I CLL presented with lymphocytosis and adenopathy diagnosed in 2018.    Secondary diagnosis: Fibroadenoma of the left breast diagnosed in 2018.  Did not receive any additional therapy after surgical resection.   Current therapy: Ibrutinib 420 mg started in December 2020.   Interim History: Ms. Frasca returns for repeat evaluation.  Since the last visit, she started ibrutinib and has taken it for close to 3 weeks at this time.  She denies any complications related to it.  She denies any nausea, fatigue or bruising.  She denies any worsening edema or painful adenopathy.  She does report her cervical adenopathy has improved at this time.       Medications: Reviewed and remains unchanged. Current Outpatient Medications  Medication Sig Dispense Refill  . buPROPion (WELLBUTRIN XL) 150 MG 24 hr tablet Take 1 tablet by mouth daily.    Marland Kitchen CALCIUM PO Take 1 tablet by mouth daily. PATIENT NOT SURE OF DOSAGE    . Cetirizine HCl (ZYRTEC PO) Take 1 tablet by mouth daily.     Marland Kitchen FLUoxetine (PROZAC) 20 MG tablet TAKE 1 TABLET(20 MG) BY MOUTH DAILY 90 tablet 2  . Ibrutinib 420 MG TABS Take 420 mg by mouth daily. 28 tablet 1  . Magnesium Gluconate (MAGNESIUM 27 PO) Take by mouth.    . metoprolol tartrate (LOPRESSOR) 25 MG tablet Take 0.5 tablets (12.5 mg total) by mouth 2 (two) times daily. 90 tablet 3  . Multiple Vitamin (MULTIVITAMIN) tablet Take 1 tablet by mouth daily.    . potassium chloride (K-DUR) 10 MEQ tablet Take 10 mEq by mouth daily.    Marland Kitchen telmisartan-hydrochlorothiazide (MICARDIS HCT) 80-25 MG tablet Take 1 tablet by mouth daily. 90 tablet 3   No current facility-administered medications for this visit.     Allergies:  Allergies  Allergen Reactions  . Codeine Other (See Comments)    Fainted    . Latex Itching and Other (See Comments)    sensitivity      Physical Exam:  Blood pressure 112/74, pulse 70, temperature 97.8 F (36.6 C), temperature source Temporal, resp. rate 17, height 5\' 3"  (1.6 m), weight 140 lb (63.5 kg), last menstrual period 08/05/2007, SpO2 98 %.     ECOG: 0   General appearance: Comfortable appearing without any discomfort Head: Normocephalic without any trauma Oropharynx: Mucous membranes are moist and pink without any thrush or ulcers. Eyes: Pupils are equal and round reactive to light. Lymph nodes: Decrease in her cervical adenopathy. Heart:regular rate and rhythm.  S1 and S2 without leg edema. Lung: Clear without any rhonchi or wheezes.  No dullness to percussion. Abdomin: Soft, nontender, nondistended with good bowel sounds.  No hepatosplenomegaly. Musculoskeletal: No joint deformity or effusion.  Full range of motion noted. Neurological: No deficits noted on motor, sensory and deep tendon reflex exam. Skin: No petechial rash or dryness.  Appeared moist.        Lab Results: Lab Results  Component Value Date   WBC 89.4 (HH) 09/07/2019   HGB 12.0 09/07/2019   HCT 37.5 09/07/2019   MCV 99.2 09/07/2019   PLT 166 09/07/2019     Chemistry      Component Value Date/Time   NA 143 09/07/2019 1257   NA 142 08/19/2017 1410   K 3.9 09/07/2019 1257   K 3.8 08/19/2017  1410   CL 104 09/07/2019 1257   CO2 29 09/07/2019 1257   CO2 29 08/19/2017 1410   BUN 15 09/07/2019 1257   BUN 19.9 08/19/2017 1410   CREATININE 1.02 (H) 09/07/2019 1257   CREATININE 0.8 08/19/2017 1410      Component Value Date/Time   CALCIUM 9.3 09/07/2019 1257   CALCIUM 9.9 08/19/2017 1410   ALKPHOS 117 09/07/2019 1257   ALKPHOS 89 08/19/2017 1410   AST 61 (H) 09/07/2019 1257   AST 20 08/19/2017 1410   ALT 81 (H) 09/07/2019 1257   ALT 14 08/19/2017 1410   BILITOT 0.4 09/07/2019 1257   BILITOT 0.60 08/19/2017 1410       Impression and Plan:  69 year old  woman with:  1.  CLL diagnosed in 2018.  She presented with stage I with lymphocytosis and adenopathy.  She has been on ibrutinib and has tolerated it very well at this time.  She did notice improvement in her cervical adenopathy without any major complications.  Risks and benefits of continuing this therapy moving forward was reviewed.  Alternative option including systemic chemotherapy were also reiterated.  She is agreeable to continue at this time. Her leukocytosis is expected at this time especially in the early phase of treatment.  Anticipate improvement in her lymphocyte count in the future.    2.  Autoimmune considerations: No evidence of any autoimmune issues at this time.  We will continue to monitor.  3. Opportunistic infection: No issues with fevers or infection noted at this time.  4.  Breast malignancy surveillance: She will continue to have surveillance with of breast mammography future.  5. Follow-up: In 4 weeks for repeat evaluation.  30 minutes was spent on this encounter.  Time was dedicated to reviewing her disease status, reviewing laboratory data as well as alternative treatment options and complications to current therapy.   Zola Button, MD 1/13/20213:12 PM

## 2019-10-18 ENCOUNTER — Telehealth: Payer: Self-pay | Admitting: Oncology

## 2019-10-18 NOTE — Telephone Encounter (Signed)
Scheduled appt per 1/13 los.  Sent a message to HIM pool to get a calendar mailed out. 

## 2019-11-02 LAB — FISH,CLL PROGNOSTIC PANEL

## 2019-11-14 ENCOUNTER — Other Ambulatory Visit: Payer: Self-pay | Admitting: Oncology

## 2019-11-14 DIAGNOSIS — C9110 Chronic lymphocytic leukemia of B-cell type not having achieved remission: Secondary | ICD-10-CM

## 2019-11-16 ENCOUNTER — Inpatient Hospital Stay: Payer: 59

## 2019-11-16 ENCOUNTER — Other Ambulatory Visit: Payer: Self-pay

## 2019-11-16 ENCOUNTER — Inpatient Hospital Stay: Payer: 59 | Attending: Oncology | Admitting: Oncology

## 2019-11-16 VITALS — BP 113/79 | HR 73 | Temp 97.9°F | Resp 18 | Wt 138.5 lb

## 2019-11-16 DIAGNOSIS — C9110 Chronic lymphocytic leukemia of B-cell type not having achieved remission: Secondary | ICD-10-CM

## 2019-11-16 DIAGNOSIS — D696 Thrombocytopenia, unspecified: Secondary | ICD-10-CM | POA: Diagnosis not present

## 2019-11-16 DIAGNOSIS — D242 Benign neoplasm of left breast: Secondary | ICD-10-CM | POA: Diagnosis not present

## 2019-11-16 DIAGNOSIS — D591 Autoimmune hemolytic anemia, unspecified: Secondary | ICD-10-CM | POA: Insufficient documentation

## 2019-11-16 DIAGNOSIS — Z79899 Other long term (current) drug therapy: Secondary | ICD-10-CM | POA: Diagnosis not present

## 2019-11-16 LAB — CBC WITH DIFFERENTIAL (CANCER CENTER ONLY)
Abs Immature Granulocytes: 0.48 10*3/uL — ABNORMAL HIGH (ref 0.00–0.07)
Basophils Absolute: 0 10*3/uL (ref 0.0–0.1)
Basophils Relative: 0 %
Eosinophils Absolute: 0.1 10*3/uL (ref 0.0–0.5)
Eosinophils Relative: 0 %
HCT: 39.8 % (ref 36.0–46.0)
Hemoglobin: 12.6 g/dL (ref 12.0–15.0)
Immature Granulocytes: 0 %
Lymphocytes Relative: 90 %
Lymphs Abs: 97.1 10*3/uL — ABNORMAL HIGH (ref 0.7–4.0)
MCH: 31.4 pg (ref 26.0–34.0)
MCHC: 31.7 g/dL (ref 30.0–36.0)
MCV: 99.3 fL (ref 80.0–100.0)
Monocytes Absolute: 1.6 10*3/uL — ABNORMAL HIGH (ref 0.1–1.0)
Monocytes Relative: 2 %
Neutro Abs: 8.3 10*3/uL — ABNORMAL HIGH (ref 1.7–7.7)
Neutrophils Relative %: 8 %
Platelet Count: 250 10*3/uL (ref 150–400)
RBC: 4.01 MIL/uL (ref 3.87–5.11)
RDW: 13 % (ref 11.5–15.5)
WBC Count: 107.5 10*3/uL (ref 4.0–10.5)
nRBC: 0 % (ref 0.0–0.2)

## 2019-11-16 LAB — CMP (CANCER CENTER ONLY)
ALT: 16 U/L (ref 0–44)
AST: 19 U/L (ref 15–41)
Albumin: 4.1 g/dL (ref 3.5–5.0)
Alkaline Phosphatase: 86 U/L (ref 38–126)
Anion gap: 11 (ref 5–15)
BUN: 16 mg/dL (ref 8–23)
CO2: 27 mmol/L (ref 22–32)
Calcium: 8.9 mg/dL (ref 8.9–10.3)
Chloride: 103 mmol/L (ref 98–111)
Creatinine: 0.85 mg/dL (ref 0.44–1.00)
GFR, Est AFR Am: >60 mL/min (ref >60)
GFR, Estimated: >60 mL/min (ref >60)
Glucose, Bld: 94 mg/dL (ref 70–99)
Potassium: 3.7 mmol/L (ref 3.5–5.1)
Sodium: 141 mmol/L (ref 135–145)
Total Bilirubin: 0.6 mg/dL (ref 0.3–1.2)
Total Protein: 6.7 g/dL (ref 6.5–8.1)

## 2019-11-16 NOTE — Progress Notes (Signed)
[facility] called with critical wbc 107.5. Dr. Alen Blew made aware and noted.

## 2019-11-16 NOTE — Progress Notes (Signed)
Hematology and Oncology Follow Up Visit  Alice Pollard [id number] Apr 16, 1951 69 y.o. 11/16/2019 3:01 PM Alice Pollard, MDSouth, Alice Main, MD   Principle Diagnosis: 69 year old woman with CLL diagnosed in 2018.  She presented with stage I disease,  lymphocytosis and adenopathy.    Secondary diagnosis: Fibroadenoma of the left breast diagnosed in 2018.  Did not receive any additional therapy after surgical resection.   Current therapy: Ibrutinib 420 mg started in December 2020.   Interim History: Alice Pollard is here for a follow-up visit.  Since the last visit, she reports no major changes in her health.  She denies any complications related to ibrutinib such as bruising or bleeding.  She does report occasional fatigue tiredness and mild sore that has resolved.  She eats well and performs activities of daily living without any decline.  Her performance status and quality of life remains excellent.       Medications: Updated without changes. Current Outpatient Medications  Medication Sig Dispense Refill  . buPROPion (WELLBUTRIN XL) 150 MG 24 hr tablet Take 1 tablet by mouth daily.    Marland Kitchen CALCIUM PO Take 1 tablet by mouth daily. PATIENT NOT SURE OF DOSAGE    . Cetirizine HCl (ZYRTEC PO) Take 1 tablet by mouth daily.     Marland Kitchen FLUoxetine (PROZAC) 20 MG tablet TAKE 1 TABLET(20 MG) BY MOUTH DAILY 90 tablet 2  . IMBRUVICA 420 MG TABS TAKE ONE CAPSULE BY MOUTH ONCE DAILY. 28 tablet 0  . Magnesium Gluconate (MAGNESIUM 27 PO) Take by mouth.    . metoprolol tartrate (LOPRESSOR) 25 MG tablet Take 0.5 tablets (12.5 mg total) by mouth 2 (two) times daily. 90 tablet 3  . Multiple Vitamin (MULTIVITAMIN) tablet Take 1 tablet by mouth daily.    . potassium chloride (K-DUR) 10 MEQ tablet Take 10 mEq by mouth daily.    Marland Kitchen telmisartan-hydrochlorothiazide (MICARDIS HCT) 80-25 MG tablet Take 1 tablet by mouth daily. 90 tablet 3   No current facility-administered medications for this visit.     Allergies:   Allergies  Allergen Reactions  . Codeine Other (See Comments)    Fainted   . Latex Itching and Other (See Comments)    sensitivity      Physical Exam:    Blood pressure 113/79, pulse 73, temperature 97.9 F (36.6 C), temperature source Temporal, resp. rate 18, weight 138 lb 8 oz (62.8 kg), last menstrual period 08/05/2007, SpO2 100 %.     ECOG: 0    General appearance: Alert, awake without any distress. Head: Atraumatic without abnormalities Oropharynx: Without any thrush or ulcers. Eyes: No scleral icterus. Lymph nodes:  Lymphadenopathy in the cervical area has resolved.  No other lymphadenopathy noted.   Heart:regular rate and rhythm, without any murmurs or gallops.   Lung: Clear to auscultation without any rhonchi, wheezes or dullness to percussion. Abdomin: Soft, nontender without any shifting dullness or ascites. Musculoskeletal: No clubbing or cyanosis. Neurological: No motor or sensory deficits. Skin: No rashes or lesions.        Lab Results: Lab Results  Component Value Date   WBC 175.8 (HH) 10/17/2019   HGB 11.7 (L) 10/17/2019   HCT 37.8 10/17/2019   MCV 99.7 10/17/2019   PLT 234 10/17/2019     Chemistry      Component Value Date/Time   NA 142 10/17/2019 1458   NA 142 08/19/2017 1410   K 3.6 10/17/2019 1458   K 3.8 08/19/2017 1410   CL 104 10/17/2019 1458  CO2 26 10/17/2019 1458   CO2 29 08/19/2017 1410   BUN 18 10/17/2019 1458   BUN 19.9 08/19/2017 1410   CREATININE 1.03 (H) 10/17/2019 1458   CREATININE 0.8 08/19/2017 1410      Component Value Date/Time   CALCIUM 8.9 10/17/2019 1458   CALCIUM 9.9 08/19/2017 1410   ALKPHOS 109 10/17/2019 1458   ALKPHOS 89 08/19/2017 1410   AST 20 10/17/2019 1458   AST 20 08/19/2017 1410   ALT 15 10/17/2019 1458   ALT 14 08/19/2017 1410   BILITOT 0.5 10/17/2019 1458   BILITOT 0.60 08/19/2017 1410       Impression and Plan:  69 year old woman with:  1.  Stage I CLL presented with the  lymphocytosis and adenopathy since 2018.  She continues to tolerate ibrutinib without any major complaints and has reported clinical improvement in her lymphadenopathy.  CBC personally reviewed today and discussed with the patient.  She has improvement in her white cell count which is predictable at this time.  Hemoglobin and platelet count appears normal.  Risks and benefits of continuing this therapy long-term was discussed.  Alternative options including systemic chemotherapy, immunotherapy or combination were also reviewed.  I have recommended continuing the same dose and schedule.  I have reviewed potential long-term complication associated with this medication again.  These would include cardiac issues, bruising, bleeding and GI toxicity.    2.  Autoimmune considerations: Thrombocytopenia, autoimmune hemolytic anemia potential complications related to CLL and she has not experienced any at this time.  3. Opportunistic infection: I continue to educate her about potential opportunistic infections related to CLL.  4.  Fibroadenoma of the breast diagnosed in 2018.  He status post surgical resection without any evidence of relapse.  She is currently on active surveillance with mammography.    5. Follow-up: In 6 weeks for repeat evaluation.  30 minutes was dedicated to this visit.  The time was spent on reviewing laboratory data, disease status update, treatment options and complications related therapy.  Zola Button, MD 2/12/20213:01 PM

## 2019-11-19 ENCOUNTER — Telehealth: Payer: Self-pay | Admitting: Oncology

## 2019-11-19 NOTE — Telephone Encounter (Signed)
Scheduled appt per 2/12 los.  Sent a message to HIM pool to get a calendar mailed out. 

## 2019-11-25 ENCOUNTER — Ambulatory Visit: Payer: 59 | Attending: Internal Medicine

## 2019-11-25 DIAGNOSIS — Z23 Encounter for immunization: Secondary | ICD-10-CM

## 2019-11-25 NOTE — Progress Notes (Signed)
   Covid-19 Vaccination Clinic  Name:  Alice Pollard    MRN: [id number] DOB: 1951-05-11  11/25/2019  [name] was observed post Covid-19 immunization for 15 minutes without incidence. She was provided with Vaccine Information Sheet and instruction to access the V-Safe system.   [name] was instructed to call 911 with any severe reactions post vaccine: Marland Kitchen Difficulty breathing  . Swelling of your face and throat  . A fast heartbeat  . A bad rash all over your body  . Dizziness and weakness    Immunizations Administered    Name Date Dose VIS Date Route   Pfizer COVID-19 Vaccine 11/25/2019  1:08 PM 0.3 mL 09/14/2019 Intramuscular   Manufacturer: [place]   Lot: [id number]   [place]: [id number]

## 2019-12-19 ENCOUNTER — Ambulatory Visit: Payer: 59 | Attending: Internal Medicine

## 2019-12-19 DIAGNOSIS — Z23 Encounter for immunization: Secondary | ICD-10-CM

## 2019-12-19 NOTE — Progress Notes (Signed)
   Covid-19 Vaccination Clinic  Name:  [name]    MRN: [id number] DOB: 04-Mar-1951  12/19/2019  [name] was observed post Covid-19 immunization for 15 minutes without incident. She was provided with Vaccine Information Sheet and instruction to access the V-Safe system.   [name] was instructed to call 911 with any severe reactions post vaccine: Marland Kitchen Difficulty breathing  . Swelling of face and throat  . A fast heartbeat  . A bad rash all over body  . Dizziness and weakness   Immunizations Administered    Name Date Dose VIS Date Route   Pfizer COVID-19 Vaccine 12/19/2019 12:02 PM 0.3 mL 09/14/2019 Intramuscular   Manufacturer: [place]   Lot: [id number]   [place]: [id number]

## 2019-12-20 ENCOUNTER — Other Ambulatory Visit: Payer: Self-pay

## 2019-12-20 DIAGNOSIS — C9110 Chronic lymphocytic leukemia of B-cell type not having achieved remission: Secondary | ICD-10-CM

## 2019-12-20 MED ORDER — IMBRUVICA 420 MG PO TABS: 1.0000 | ORAL_TABLET | Freq: Every day | ORAL | 0 refills | Status: DC

## 2019-12-28 ENCOUNTER — Telehealth: Payer: Self-pay | Admitting: *Deleted

## 2019-12-28 ENCOUNTER — Inpatient Hospital Stay: Payer: 59

## 2019-12-28 ENCOUNTER — Telehealth: Payer: Self-pay

## 2019-12-28 ENCOUNTER — Inpatient Hospital Stay: Payer: 59 | Attending: Oncology | Admitting: Oncology

## 2019-12-28 ENCOUNTER — Other Ambulatory Visit: Payer: Self-pay

## 2019-12-28 DIAGNOSIS — Z79899 Other long term (current) drug therapy: Secondary | ICD-10-CM | POA: Insufficient documentation

## 2019-12-28 DIAGNOSIS — D242 Benign neoplasm of left breast: Secondary | ICD-10-CM | POA: Insufficient documentation

## 2019-12-28 DIAGNOSIS — C9110 Chronic lymphocytic leukemia of B-cell type not having achieved remission: Secondary | ICD-10-CM

## 2019-12-28 LAB — CMP (CANCER CENTER ONLY)
ALT: 18 U/L (ref 0–44)
AST: 17 U/L (ref 15–41)
Albumin: 3.8 g/dL (ref 3.5–5.0)
Alkaline Phosphatase: 85 U/L (ref 38–126)
Anion gap: 11 (ref 5–15)
BUN: 14 mg/dL (ref 8–23)
CO2: 27 mmol/L (ref 22–32)
Calcium: 9 mg/dL (ref 8.9–10.3)
Chloride: 103 mmol/L (ref 98–111)
Creatinine: 0.84 mg/dL (ref 0.44–1.00)
GFR, Est AFR Am: >60 mL/min (ref >60)
GFR, Estimated: >60 mL/min (ref >60)
Glucose, Bld: 95 mg/dL (ref 70–99)
Potassium: 3.7 mmol/L (ref 3.5–5.1)
Sodium: 141 mmol/L (ref 135–145)
Total Bilirubin: 0.7 mg/dL (ref 0.3–1.2)
Total Protein: 6.6 g/dL (ref 6.5–8.1)

## 2019-12-28 LAB — CBC WITH DIFFERENTIAL (CANCER CENTER ONLY)
Abs Immature Granulocytes: 0.02 10*3/uL (ref 0.00–0.07)
Basophils Absolute: 0 10*3/uL (ref 0.0–0.1)
Basophils Relative: 0 %
Eosinophils Absolute: 0 10*3/uL (ref 0.0–0.5)
Eosinophils Relative: 0 %
HCT: 41.7 % (ref 36.0–46.0)
Hemoglobin: 13.5 g/dL (ref 12.0–15.0)
Immature Granulocytes: 0 %
Lymphocytes Relative: 98 %
Lymphs Abs: 39.9 10*3/uL — ABNORMAL HIGH (ref 0.7–4.0)
MCH: 31.5 pg (ref 26.0–34.0)
MCHC: 32.4 g/dL (ref 30.0–36.0)
MCV: 97.2 fL (ref 80.0–100.0)
Monocytes Absolute: 0.9 10*3/uL (ref 0.1–1.0)
Monocytes Relative: 2 %
Neutro Abs: 0 10*3/uL — CL (ref 1.7–7.7)
Neutrophils Relative %: 0 %
Platelet Count: 318 10*3/uL (ref 150–400)
RBC: 4.29 MIL/uL (ref 3.87–5.11)
RDW: 12.3 % (ref 11.5–15.5)
WBC Count: 41.1 10*3/uL — ABNORMAL HIGH (ref 4.0–10.5)
nRBC: 0 % (ref 0.0–0.2)

## 2019-12-28 MED ORDER — IMBRUVICA 420 MG PO TABS: 1.0000 | ORAL_TABLET | Freq: Every day | ORAL | 3 refills | Status: DC

## 2019-12-28 NOTE — Telephone Encounter (Signed)
Critical Value taken @ 345 ANC 0.0.  Results called to K. Heller, RN with Dr. Alen Blew within 5 minutes.

## 2019-12-28 NOTE — Progress Notes (Signed)
Hematology and Oncology Follow Up Visit  NAYLANIE MARENTES [id number] May 08, 1951 69 y.o. 12/28/2019 3:11 PM [name], MDSouth, [name], MD   Principle Diagnosis: 69 year old woman with stage I CLL presented with lymphocytosis and adenopathy in 2018.    Secondary diagnosis: Fibroadenoma of the left breast diagnosed in 2018.  Did not receive any additional therapy after surgical resection.   Current therapy: Ibrutinib 420 mg started in December 2020.   Interim History: Ms. Haseley is here for return evaluation.  Since the last visit, she reports no recent complaints.  She continues to tolerate ibrutinib without any recent complications.  She denies any palpitation, bruising or nausea.  She denies excessive fatigue or tiredness.  She denies any lymphadenopathy or fevers.  She denies any constitutional symptoms or recent hospitalizations.       Medications: Reviewed without changes. Current Outpatient Medications  Medication Sig Dispense Refill  . buPROPion (WELLBUTRIN XL) 150 MG 24 hr tablet Take 1 tablet by mouth daily.    Marland Kitchen CALCIUM PO Take 1 tablet by mouth daily. PATIENT NOT SURE OF DOSAGE    . Cetirizine HCl (ZYRTEC PO) Take 1 tablet by mouth daily.     Marland Kitchen FLUoxetine (PROZAC) 20 MG tablet TAKE 1 TABLET(20 MG) BY MOUTH DAILY 90 tablet 2  . Ibrutinib (IMBRUVICA) 420 MG TABS Take 1 capsule by mouth daily. 28 tablet 0  . Magnesium Gluconate (MAGNESIUM 27 PO) Take by mouth.    . metoprolol tartrate (LOPRESSOR) 25 MG tablet Take 0.5 tablets (12.5 mg total) by mouth 2 (two) times daily. 90 tablet 3  . Multiple Vitamin (MULTIVITAMIN) tablet Take 1 tablet by mouth daily.    . potassium chloride (K-DUR) 10 MEQ tablet Take 10 mEq by mouth daily.    Marland Kitchen telmisartan-hydrochlorothiazide (MICARDIS HCT) 80-25 MG tablet Take 1 tablet by mouth daily. 90 tablet 3   No current facility-administered medications for this visit.     Allergies:  Allergies  Allergen Reactions  . Codeine Other (See  Comments)    Fainted   . Latex Itching and Other (See Comments)    sensitivity      Physical Exam:    Blood pressure 111/77, pulse 86, temperature 98 F (36.7 C), temperature source Temporal, resp. rate 18, height 5\' 3"  (1.6 m), weight 137 lb 14.4 oz (62.6 kg), last menstrual period 08/05/2007, SpO2 100 %.      ECOG: 0   General appearance: Comfortable appearing without any discomfort Head: Normocephalic without any trauma Oropharynx: Mucous membranes are moist and pink without any thrush or ulcers. Eyes: Pupils are equal and round reactive to light. Lymph nodes: No cervical, supraclavicular, inguinal or axillary lymphadenopathy.   Heart:regular rate and rhythm.  S1 and S2 without leg edema. Lung: Clear without any rhonchi or wheezes.  No dullness to percussion. Abdomin: Soft, nontender, nondistended with good bowel sounds.  No hepatosplenomegaly. Musculoskeletal: No joint deformity or effusion.  Full range of motion noted. Neurological: No deficits noted on motor, sensory and deep tendon reflex exam. Skin: No petechial rash or dryness.  Appeared moist.         Lab Results: Lab Results  Component Value Date   WBC 107.5 (HH) 11/16/2019   HGB 12.6 11/16/2019   HCT 39.8 11/16/2019   MCV 99.3 11/16/2019   PLT 250 11/16/2019     Chemistry      Component Value Date/Time   NA 141 11/16/2019 1453   NA 142 08/19/2017 1410   K 3.7 11/16/2019 1453  K 3.8 08/19/2017 1410   CL 103 11/16/2019 1453   CO2 27 11/16/2019 1453   CO2 29 08/19/2017 1410   BUN 16 11/16/2019 1453   BUN 19.9 08/19/2017 1410   CREATININE 0.85 11/16/2019 1453   CREATININE 0.8 08/19/2017 1410      Component Value Date/Time   CALCIUM 8.9 11/16/2019 1453   CALCIUM 9.9 08/19/2017 1410   ALKPHOS 86 11/16/2019 1453   ALKPHOS 89 08/19/2017 1410   AST 19 11/16/2019 1453   AST 20 08/19/2017 1410   ALT 16 11/16/2019 1453   ALT 14 08/19/2017 1410   BILITOT 0.6 11/16/2019 1453   BILITOT 0.60  08/19/2017 1410       Impression and Plan:  69 year old woman with:  1.  CLL diagnosed in 2018.  She presented with stage I disease and lymphadenopathy.  She is currently on ibrutinib without any complications.  Laboratory data continues to improve indicating positive response to therapy as well as clinical improvement in her lymphadenopathy.  Risks and benefits of continuing this treatment versus alternative options such as active surveillance and systemic chemotherapy were reviewed.  Laboratory data from today showed continues improvement in her white cell count and approaching normal range although not quite in hematological remission.  After discussion today, she is agreeable to continue with ibrutinib at this time.     2.  Autoimmune considerations: No issues reported at this time with autoimmune hemolytic anemia or thrombocytopenia.  We will continue to monitor moving forward.  3. Opportunistic infection: I continue to emphasize the importance of opportunistic infections given her history of CLL.  No recent signs of symptoms of infection.  4.  Fibroadenoma of the breast: He status post surgical resection in 2018 without any residual disease.  She will continue surveillance mammography.  5. Follow-up: She will return in 6 weeks for repeat evaluation.  30 minutes were dedicated to this encounter.  The time was spent on reviewing disease status, reviewing laboratory data, treatment options and future plan of care discussion.  Zola Button, MD 3/26/20213:11 PM

## 2019-12-28 NOTE — Telephone Encounter (Signed)
Lab called with critical value ANC 0.0. Dr. Alen Blew made aware. No new orders at this time.

## 2019-12-31 ENCOUNTER — Telehealth: Payer: Self-pay | Admitting: Oncology

## 2019-12-31 NOTE — Telephone Encounter (Signed)
Scheduled appt per 3/26 los.  Sent a message to HIM pool to get a calendar mailed out. 

## 2020-02-08 ENCOUNTER — Inpatient Hospital Stay: Payer: 59

## 2020-02-08 ENCOUNTER — Other Ambulatory Visit: Payer: Self-pay

## 2020-02-08 ENCOUNTER — Inpatient Hospital Stay: Payer: 59 | Attending: Oncology | Admitting: Oncology

## 2020-02-08 VITALS — BP 116/81 | HR 73 | Temp 98.0°F | Resp 18 | Ht 63.0 in | Wt 139.0 lb

## 2020-02-08 DIAGNOSIS — C9110 Chronic lymphocytic leukemia of B-cell type not having achieved remission: Secondary | ICD-10-CM | POA: Diagnosis present

## 2020-02-08 DIAGNOSIS — D242 Benign neoplasm of left breast: Secondary | ICD-10-CM | POA: Insufficient documentation

## 2020-02-08 DIAGNOSIS — Z79899 Other long term (current) drug therapy: Secondary | ICD-10-CM | POA: Insufficient documentation

## 2020-02-08 LAB — CBC WITH DIFFERENTIAL (CANCER CENTER ONLY)
Abs Immature Granulocytes: 0.13 10*3/uL — ABNORMAL HIGH (ref 0.00–0.07)
Basophils Absolute: 0.1 10*3/uL (ref 0.0–0.1)
Basophils Relative: 0 %
Eosinophils Absolute: 0.2 10*3/uL (ref 0.0–0.5)
Eosinophils Relative: 1 %
HCT: 41.7 % (ref 36.0–46.0)
Hemoglobin: 13.7 g/dL (ref 12.0–15.0)
Immature Granulocytes: 0 %
Lymphocytes Relative: 83 %
Lymphs Abs: 39.7 10*3/uL — ABNORMAL HIGH (ref 0.7–4.0)
MCH: 30.6 pg (ref 26.0–34.0)
MCHC: 32.9 g/dL (ref 30.0–36.0)
MCV: 93.1 fL (ref 80.0–100.0)
Monocytes Absolute: 1.1 10*3/uL — ABNORMAL HIGH (ref 0.1–1.0)
Monocytes Relative: 2 %
Neutro Abs: 6.6 10*3/uL (ref 1.7–7.7)
Neutrophils Relative %: 14 %
Platelet Count: 229 10*3/uL (ref 150–400)
RBC: 4.48 MIL/uL (ref 3.87–5.11)
RDW: 13.1 % (ref 11.5–15.5)
WBC Count: 47.8 10*3/uL — ABNORMAL HIGH (ref 4.0–10.5)
nRBC: 0 % (ref 0.0–0.2)

## 2020-02-08 LAB — CMP (CANCER CENTER ONLY)
ALT: 20 U/L (ref 0–44)
AST: 19 U/L (ref 15–41)
Albumin: 3.7 g/dL (ref 3.5–5.0)
Alkaline Phosphatase: 84 U/L (ref 38–126)
Anion gap: 11 (ref 5–15)
BUN: 17 mg/dL (ref 8–23)
CO2: 28 mmol/L (ref 22–32)
Calcium: 9.4 mg/dL (ref 8.9–10.3)
Chloride: 106 mmol/L (ref 98–111)
Creatinine: 0.85 mg/dL (ref 0.44–1.00)
GFR, Est AFR Am: >60 mL/min (ref >60)
GFR, Estimated: >60 mL/min (ref >60)
Glucose, Bld: 72 mg/dL (ref 70–99)
Potassium: 3.5 mmol/L (ref 3.5–5.1)
Sodium: 145 mmol/L (ref 135–145)
Total Bilirubin: 0.5 mg/dL (ref 0.3–1.2)
Total Protein: 6.1 g/dL — ABNORMAL LOW (ref 6.5–8.1)

## 2020-02-08 NOTE — Progress Notes (Signed)
Hematology and Oncology Follow Up Visit  Alice Pollard [id number] 20-May-1951 69 y.o. 02/08/2020 10:07 AM Alice Pollard, MDSouth, Alice Main, MD   Principle Diagnosis: 69 year old woman with CLL diagnosed in 2018.  She presented with stage I disease including lymphocytosis and adenopathy.  Secondary diagnosis: Fibroadenoma of the left breast diagnosed in 2018.  Did not receive any additional therapy after surgical resection.   Current therapy: Ibrutinib 420 mg started in December 2020.   Interim History: Alice Pollard returns today for a follow-up visit.  Since her last visit, she reports no major changes in her health.  She continues to tolerate ibrutinib without any new complaints.  She denies any epistaxis, hematochezia or melena.  She denies any easy bruising or ecchymosis.  She denies any palpitation or excessive fatigue.  Continues to be active and attends activities of daily living.  She denies any lymphadenopathy or petechiae.  Formal status and quality of life remain excellent.       Medications: Updated on review. Current Outpatient Medications  Medication Sig Dispense Refill  . buPROPion (WELLBUTRIN XL) 150 MG 24 hr tablet Take 1 tablet by mouth daily.    Marland Kitchen CALCIUM PO Take 1 tablet by mouth daily. PATIENT NOT SURE OF DOSAGE    . Cetirizine HCl (ZYRTEC PO) Take 1 tablet by mouth daily.     Marland Kitchen FLUoxetine (PROZAC) 20 MG tablet TAKE 1 TABLET(20 MG) BY MOUTH DAILY 90 tablet 2  . Ibrutinib (IMBRUVICA) 420 MG TABS Take 1 capsule by mouth daily. 28 tablet 3  . Magnesium Gluconate (MAGNESIUM 27 PO) Take by mouth.    . metoprolol tartrate (LOPRESSOR) 25 MG tablet Take 0.5 tablets (12.5 mg total) by mouth 2 (two) times daily. 90 tablet 3  . Multiple Vitamin (MULTIVITAMIN) tablet Take 1 tablet by mouth daily.    . potassium chloride (K-DUR) 10 MEQ tablet Take 10 mEq by mouth daily.    Marland Kitchen telmisartan-hydrochlorothiazide (MICARDIS HCT) 80-25 MG tablet Take 1 tablet by mouth daily. 90 tablet 3    No current facility-administered medications for this visit.     Allergies:  Allergies  Allergen Reactions  . Codeine Other (See Comments)    Fainted   . Latex Itching and Other (See Comments)    sensitivity      Physical Exam:     Blood pressure 116/81, pulse 73, temperature 98 F (36.7 C), temperature source Temporal, resp. rate 18, height 5\' 3"  (1.6 m), weight 139 lb (63 kg), last menstrual period 08/05/2007, SpO2 98 %.      ECOG: 0    General appearance: Alert, awake without any distress. Head: Atraumatic without abnormalities Oropharynx: Without any thrush or ulcers. Eyes: No scleral icterus. Lymph nodes: No lymphadenopathy noted in the cervical, supraclavicular, or axillary nodes Heart:regular rate and rhythm, without any murmurs or gallops.   Lung: Clear to auscultation without any rhonchi, wheezes or dullness to percussion. Abdomin: Soft, nontender without any shifting dullness or ascites. Musculoskeletal: No clubbing or cyanosis. Neurological: No motor or sensory deficits. Skin: No rashes or lesions.        Lab Results: Lab Results  Component Value Date   WBC 41.1 (H) 12/28/2019   HGB 13.5 12/28/2019   HCT 41.7 12/28/2019   MCV 97.2 12/28/2019   PLT 318 12/28/2019     Chemistry      Component Value Date/Time   NA 141 12/28/2019 1500   NA 142 08/19/2017 1410   K 3.7 12/28/2019 1500   K 3.8 08/19/2017  1410   CL 103 12/28/2019 1500   CO2 27 12/28/2019 1500   CO2 29 08/19/2017 1410   BUN 14 12/28/2019 1500   BUN 19.9 08/19/2017 1410   CREATININE 0.84 12/28/2019 1500   CREATININE 0.8 08/19/2017 1410      Component Value Date/Time   CALCIUM 9.0 12/28/2019 1500   CALCIUM 9.9 08/19/2017 1410   ALKPHOS 85 12/28/2019 1500   ALKPHOS 89 08/19/2017 1410   AST 17 12/28/2019 1500   AST 20 08/19/2017 1410   ALT 18 12/28/2019 1500   ALT 14 08/19/2017 1410   BILITOT 0.7 12/28/2019 1500   BILITOT 0.60 08/19/2017 1410       Impression  and Plan:  69 year old woman with:  1.  Stage I CLL presented with lymphocytosis and adenopathy in 2018.    She continues to tolerate ibrutinib without any major complications.  She continues to have a positive hematological response with decrease in her total white cell count of lymphocytosis as well as adenopathy.  Risks and benefits of continuing this drug was reviewed today.  Potential complications including bleeding, thrombosis, bruising and cardiac arrhythmia.  She is agreeable to continue at this time.  Her white cell count continues to respond with a white cell count mildly elevated still compared to previous encounter.  I recommended continuing the same dose and schedule for the time being.   2.  Autoimmune considerations: I continue to educate her about potential complications related to CLL including autoimmune anemia and thrombocytopenia..  3. Opportunistic infection: No signs or symptoms of infections at this time.  I emphasized a risk of immunocompromise condition related to CLL and susceptibility to infection.  4.  Fibroadenoma of the breast: No evidence of relapse at this time after surgical resection.  5. Follow-up: She will return in July for repeat follow-up.  30 minutes were spent on this visit.  The time was dedicated to updating her disease status, addressing complication related to therapy and future plans of care discussion.  Alice Button, MD 5/7/202110:07 AM

## 2020-02-12 ENCOUNTER — Telehealth: Payer: Self-pay | Admitting: Oncology

## 2020-02-12 NOTE — Telephone Encounter (Signed)
Scheduled per 05/07 los, patient has been called and notified. 

## 2020-04-17 ENCOUNTER — Inpatient Hospital Stay: Payer: 59 | Attending: Oncology

## 2020-04-17 ENCOUNTER — Inpatient Hospital Stay (HOSPITAL_BASED_OUTPATIENT_CLINIC_OR_DEPARTMENT_OTHER): Payer: 59 | Admitting: Oncology

## 2020-04-17 ENCOUNTER — Other Ambulatory Visit: Payer: Self-pay

## 2020-04-17 VITALS — BP 125/83 | HR 67 | Temp 97.5°F | Wt 144.5 lb

## 2020-04-17 DIAGNOSIS — Z79899 Other long term (current) drug therapy: Secondary | ICD-10-CM | POA: Insufficient documentation

## 2020-04-17 DIAGNOSIS — R599 Enlarged lymph nodes, unspecified: Secondary | ICD-10-CM | POA: Insufficient documentation

## 2020-04-17 DIAGNOSIS — D242 Benign neoplasm of left breast: Secondary | ICD-10-CM | POA: Insufficient documentation

## 2020-04-17 DIAGNOSIS — C9110 Chronic lymphocytic leukemia of B-cell type not having achieved remission: Secondary | ICD-10-CM | POA: Insufficient documentation

## 2020-04-17 LAB — CMP (CANCER CENTER ONLY)
ALT: 21 U/L (ref 0–44)
AST: 22 U/L (ref 15–41)
Albumin: 3.8 g/dL (ref 3.5–5.0)
Alkaline Phosphatase: 78 U/L (ref 38–126)
Anion gap: 10 (ref 5–15)
BUN: 15 mg/dL (ref 8–23)
CO2: 28 mmol/L (ref 22–32)
Calcium: 9.4 mg/dL (ref 8.9–10.3)
Chloride: 104 mmol/L (ref 98–111)
Creatinine: 0.88 mg/dL (ref 0.44–1.00)
GFR, Est AFR Am: >60 mL/min (ref >60)
GFR, Estimated: >60 mL/min (ref >60)
Glucose, Bld: 75 mg/dL (ref 70–99)
Potassium: 3.8 mmol/L (ref 3.5–5.1)
Sodium: 142 mmol/L (ref 135–145)
Total Bilirubin: 0.7 mg/dL (ref 0.3–1.2)
Total Protein: 6.5 g/dL (ref 6.5–8.1)

## 2020-04-17 LAB — CBC WITH DIFFERENTIAL (CANCER CENTER ONLY)
Abs Immature Granulocytes: 0.11 10*3/uL — ABNORMAL HIGH (ref 0.00–0.07)
Basophils Absolute: 0.1 10*3/uL (ref 0.0–0.1)
Basophils Relative: 0 %
Eosinophils Absolute: 0.1 10*3/uL (ref 0.0–0.5)
Eosinophils Relative: 0 %
HCT: 42.8 % (ref 36.0–46.0)
Hemoglobin: 14 g/dL (ref 12.0–15.0)
Immature Granulocytes: 0 %
Lymphocytes Relative: 78 %
Lymphs Abs: 27 10*3/uL — ABNORMAL HIGH (ref 0.7–4.0)
MCH: 29.9 pg (ref 26.0–34.0)
MCHC: 32.7 g/dL (ref 30.0–36.0)
MCV: 91.5 fL (ref 80.0–100.0)
Monocytes Absolute: 1.1 10*3/uL — ABNORMAL HIGH (ref 0.1–1.0)
Monocytes Relative: 3 %
Neutro Abs: 6.4 10*3/uL (ref 1.7–7.7)
Neutrophils Relative %: 19 %
Platelet Count: 256 10*3/uL (ref 150–400)
RBC: 4.68 MIL/uL (ref 3.87–5.11)
RDW: 13.6 % (ref 11.5–15.5)
WBC Count: 34.8 10*3/uL — ABNORMAL HIGH (ref 4.0–10.5)
nRBC: 0 % (ref 0.0–0.2)

## 2020-04-17 NOTE — Progress Notes (Signed)
Hematology and Oncology Follow Up Visit  UNIQUE SILLAS [id number] 07/30/1951 69 y.o. 04/17/2020 10:23 AM [name], MDSouth, [name], MD   Principle Diagnosis: 69 year old woman with stage I CLL presented with lymphocytosis and adenopathy diagnosed in 2018.    Secondary diagnosis: Fibroadenoma of the left breast diagnosed in 2018.  Did not receive any additional therapy after surgical resection.   Current therapy: Ibrutinib 420 mg started in December 2020.   Interim History: Ms. Ishikawa is here for a return evaluation.  Since the last visit, she reports no major changes in her health.  She continues to tolerate ibrutinib without any new complaints.  Denies any nausea, vomiting or abdominal pain.  She denies any changes in her bowel habits.  She denies any lymphadenopathy, petechia or palpitation.  She denies any recent bruising or any constitutional symptoms.  She continues to enjoy excellent quality of life and performance status       Medications: Unchanged on review. Current Outpatient Medications  Medication Sig Dispense Refill  . buPROPion (WELLBUTRIN XL) 300 MG 24 hr tablet Take 300 mg by mouth every morning.    Marland Kitchen CALCIUM PO Take 1 tablet by mouth daily. PATIENT NOT SURE OF DOSAGE    . Cetirizine HCl (ZYRTEC PO) Take 1 tablet by mouth daily.     Marland Kitchen FLUoxetine (PROZAC) 20 MG tablet TAKE 1 TABLET(20 MG) BY MOUTH DAILY 90 tablet 2  . Ibrutinib (IMBRUVICA) 420 MG TABS Take 1 capsule by mouth daily. 28 tablet 3  . Magnesium Gluconate (MAGNESIUM 27 PO) Take by mouth.    . metoprolol tartrate (LOPRESSOR) 25 MG tablet Take 0.5 tablets (12.5 mg total) by mouth 2 (two) times daily. 90 tablet 3  . Multiple Vitamin (MULTIVITAMIN) tablet Take 1 tablet by mouth daily.    . potassium chloride (K-DUR) 10 MEQ tablet Take 10 mEq by mouth daily.    Marland Kitchen telmisartan-hydrochlorothiazide (MICARDIS HCT) 80-25 MG tablet Take 1 tablet by mouth daily. 90 tablet 3   No current facility-administered  medications for this visit.     Allergies:  Allergies  Allergen Reactions  . Codeine Other (See Comments)    Fainted   . Latex Itching and Other (See Comments)    sensitivity      Physical Exam:      Blood pressure 125/83, pulse 67, temperature (!) 97.5 F (36.4 C), temperature source Temporal, weight 144 lb 8 oz (65.5 kg), last menstrual period 08/05/2007, SpO2 99 %.      ECOG: 0   General appearance: Comfortable appearing without any discomfort Head: Normocephalic without any trauma Oropharynx: Mucous membranes are moist and pink without any thrush or ulcers. Eyes: Pupils are equal and round reactive to light. Lymph nodes: No cervical, supraclavicular, inguinal or axillary lymphadenopathy.   Heart:regular rate and rhythm.  S1 and S2 without leg edema. Lung: Clear without any rhonchi or wheezes.  No dullness to percussion. Abdomin: Soft, nontender, nondistended with good bowel sounds.  No hepatosplenomegaly. Musculoskeletal: No joint deformity or effusion.  Full range of motion noted. Neurological: No deficits noted on motor, sensory and deep tendon reflex exam. Skin: No petechial rash or dryness.  Appeared moist.          Lab Results: Lab Results  Component Value Date   WBC 47.8 (H) 02/08/2020   HGB 13.7 02/08/2020   HCT 41.7 02/08/2020   MCV 93.1 02/08/2020   PLT 229 02/08/2020     Chemistry      Component Value Date/Time  NA 145 02/08/2020 1000   NA 142 08/19/2017 1410   K 3.5 02/08/2020 1000   K 3.8 08/19/2017 1410   CL 106 02/08/2020 1000   CO2 28 02/08/2020 1000   CO2 29 08/19/2017 1410   BUN 17 02/08/2020 1000   BUN 19.9 08/19/2017 1410   CREATININE 0.85 02/08/2020 1000   CREATININE 0.8 08/19/2017 1410      Component Value Date/Time   CALCIUM 9.4 02/08/2020 1000   CALCIUM 9.9 08/19/2017 1410   ALKPHOS 84 02/08/2020 1000   ALKPHOS 89 08/19/2017 1410   AST 19 02/08/2020 1000   AST 20 08/19/2017 1410   ALT 20 02/08/2020 1000   ALT  14 08/19/2017 1410   BILITOT 0.5 02/08/2020 1000   BILITOT 0.60 08/19/2017 1410       Impression and Plan:  69 year old woman with:  1.  CLL diagnosed in twenty eighteen.  She was found to have stage I disease with adenopathy.  The natural course of her disease was updated today and treatment options were reiterated.  She is currently on ibrutinib with continuous response on her white cell count with declining appropriately with decrease in her lymphocyte percentage.  She has tolerated this therapy well and will continue.  Alternative options would be systemic chemotherapy or venetoclax.   Laboratory data from today continues to show response with decline in her white cell count with normalization of her hemoglobin and platelet count.  She is agreeable at this time.  2.  Autoimmune considerations: He is not experiencing any anemia or thrombocytopenia we will continue to monitor.  3.  Risk of infection: No signs or symptoms of any infections at this time.  I continue to educate her about the potential risk associated with CLL and CLL treatments.  4.  Fibroadenoma of the breast: She continues to be on active surveillance at this time.  5. Follow-up: In 3 months for repeat follow-up.  30 minutes were dedicated to this encounter.  Time was spent on reviewing laboratory data, the status update discussing treatment options and future plan of care review.  Zola Button, MD 7/15/202110:23 AM

## 2020-04-18 ENCOUNTER — Telehealth: Payer: Self-pay | Admitting: Oncology

## 2020-04-18 NOTE — Telephone Encounter (Signed)
Scheduled per 07/15 los, patient has been called and notified.

## 2020-05-05 ENCOUNTER — Other Ambulatory Visit: Payer: Self-pay | Admitting: Oncology

## 2020-05-05 DIAGNOSIS — C9110 Chronic lymphocytic leukemia of B-cell type not having achieved remission: Secondary | ICD-10-CM

## 2020-05-26 ENCOUNTER — Other Ambulatory Visit: Payer: Self-pay | Admitting: Obstetrics & Gynecology

## 2020-05-26 DIAGNOSIS — Z1231 Encounter for screening mammogram for malignant neoplasm of breast: Secondary | ICD-10-CM

## 2020-05-29 ENCOUNTER — Other Ambulatory Visit: Payer: Self-pay | Admitting: Oncology

## 2020-05-29 DIAGNOSIS — C9110 Chronic lymphocytic leukemia of B-cell type not having achieved remission: Secondary | ICD-10-CM

## 2020-06-13 ENCOUNTER — Ambulatory Visit
Admission: RE | Admit: 2020-06-13 | Discharge: 2020-06-13 | Disposition: A | Discharge status: Home / Self Care | Payer: 59 | Source: Ambulatory Visit | Attending: Obstetrics & Gynecology | Admitting: Obstetrics & Gynecology

## 2020-06-13 ENCOUNTER — Other Ambulatory Visit: Payer: Self-pay

## 2020-06-13 DIAGNOSIS — Z1231 Encounter for screening mammogram for malignant neoplasm of breast: Secondary | ICD-10-CM

## 2020-06-24 ENCOUNTER — Other Ambulatory Visit: Payer: Self-pay

## 2020-06-24 DIAGNOSIS — C9110 Chronic lymphocytic leukemia of B-cell type not having achieved remission: Secondary | ICD-10-CM

## 2020-06-24 MED ORDER — IMBRUVICA 420 MG PO TABS: 1.0000 | ORAL_TABLET | Freq: Every day | ORAL | 0 refills | Status: DC

## 2020-07-10 ENCOUNTER — Telehealth: Payer: Self-pay | Admitting: Oncology

## 2020-07-10 NOTE — Telephone Encounter (Signed)
Rescheduled 10/14 per provider on call, patient has been called and notified.

## 2020-07-15 ENCOUNTER — Other Ambulatory Visit: Payer: Self-pay | Admitting: Oncology

## 2020-07-15 DIAGNOSIS — C9110 Chronic lymphocytic leukemia of B-cell type not having achieved remission: Secondary | ICD-10-CM

## 2020-07-17 ENCOUNTER — Inpatient Hospital Stay: Payer: 59 | Admitting: Oncology

## 2020-07-17 ENCOUNTER — Inpatient Hospital Stay: Payer: 59

## 2020-07-23 ENCOUNTER — Other Ambulatory Visit: Payer: Self-pay | Admitting: Cardiology

## 2020-07-24 ENCOUNTER — Other Ambulatory Visit: Payer: Self-pay

## 2020-07-24 ENCOUNTER — Inpatient Hospital Stay (HOSPITAL_BASED_OUTPATIENT_CLINIC_OR_DEPARTMENT_OTHER): Payer: 59 | Admitting: Oncology

## 2020-07-24 ENCOUNTER — Inpatient Hospital Stay: Payer: 59 | Attending: Oncology

## 2020-07-24 DIAGNOSIS — Z79899 Other long term (current) drug therapy: Secondary | ICD-10-CM | POA: Insufficient documentation

## 2020-07-24 DIAGNOSIS — C9110 Chronic lymphocytic leukemia of B-cell type not having achieved remission: Secondary | ICD-10-CM | POA: Diagnosis not present

## 2020-07-24 DIAGNOSIS — D242 Benign neoplasm of left breast: Secondary | ICD-10-CM | POA: Insufficient documentation

## 2020-07-24 LAB — CBC WITH DIFFERENTIAL (CANCER CENTER ONLY)
Abs Immature Granulocytes: 0.07 10*3/uL (ref 0.00–0.07)
Basophils Absolute: 0.1 10*3/uL (ref 0.0–0.1)
Basophils Relative: 0 %
Eosinophils Absolute: 0.1 10*3/uL (ref 0.0–0.5)
Eosinophils Relative: 0 %
HCT: 42.4 % (ref 36.0–46.0)
Hemoglobin: 14.1 g/dL (ref 12.0–15.0)
Immature Granulocytes: 0 %
Lymphocytes Relative: 67 %
Lymphs Abs: 14 10*3/uL — ABNORMAL HIGH (ref 0.7–4.0)
MCH: 30.9 pg (ref 26.0–34.0)
MCHC: 33.3 g/dL (ref 30.0–36.0)
MCV: 92.8 fL (ref 80.0–100.0)
Monocytes Absolute: 0.9 10*3/uL (ref 0.1–1.0)
Monocytes Relative: 4 %
Neutro Abs: 6.1 10*3/uL (ref 1.7–7.7)
Neutrophils Relative %: 29 %
Platelet Count: 286 10*3/uL (ref 150–400)
RBC: 4.57 MIL/uL (ref 3.87–5.11)
RDW: 13 % (ref 11.5–15.5)
WBC Count: 21.1 10*3/uL — ABNORMAL HIGH (ref 4.0–10.5)
nRBC: 0 % (ref 0.0–0.2)

## 2020-07-24 LAB — CMP (CANCER CENTER ONLY)
ALT: 15 U/L (ref 0–44)
AST: 19 U/L (ref 15–41)
Albumin: 3.8 g/dL (ref 3.5–5.0)
Alkaline Phosphatase: 71 U/L (ref 38–126)
Anion gap: 9 (ref 5–15)
BUN: 19 mg/dL (ref 8–23)
CO2: 28 mmol/L (ref 22–32)
Calcium: 9.2 mg/dL (ref 8.9–10.3)
Chloride: 104 mmol/L (ref 98–111)
Creatinine: 0.88 mg/dL (ref 0.44–1.00)
GFR, Estimated: >60 mL/min (ref >60)
Glucose, Bld: 97 mg/dL (ref 70–99)
Potassium: 3.3 mmol/L — ABNORMAL LOW (ref 3.5–5.1)
Sodium: 141 mmol/L (ref 135–145)
Total Bilirubin: 0.5 mg/dL (ref 0.3–1.2)
Total Protein: 6.2 g/dL — ABNORMAL LOW (ref 6.5–8.1)

## 2020-07-24 NOTE — Progress Notes (Signed)
Hematology and Oncology Follow Up Visit  Alice Pollard [id number] January 15, 1951 69 y.o. 07/24/2020 3:09 PM [name], MDSouth, Alice Main, MD   Principle Diagnosis: 69 year old woman with CLL diagnosed in 2018.  She was found to have stage I disease with lymphocytosis and adenopathy diagnosed.    Secondary diagnosis: Fibroadenoma of the left breast diagnosed in 2018.  Did not receive any additional therapy after surgical resection.   Current therapy: Ibrutinib 420 mg started in December 2020.   Interim History: Alice Pollard is here for repeat evaluation.  Since last visit, she reports no major changes in her health.  She denies any palpitations related to ibrutinib.  She denies any nausea vomiting or abdominal pain.  She denies any bruising or bleeding complications.  She denies any palpitation or chest pain.  Her performance status and quality of life remains unchanged.       Medications: Updated on review. Current Outpatient Medications  Medication Sig Dispense Refill  . buPROPion (WELLBUTRIN XL) 300 MG 24 hr tablet Take 300 mg by mouth every morning.    Marland Kitchen CALCIUM PO Take 1 tablet by mouth daily. PATIENT NOT SURE OF DOSAGE    . Cetirizine HCl (ZYRTEC PO) Take 1 tablet by mouth daily.     Marland Kitchen FLUoxetine (PROZAC) 20 MG tablet TAKE 1 TABLET(20 MG) BY MOUTH DAILY 90 tablet 2  . IMBRUVICA 420 MG TABS TAKE 1 TABLET BY MOUTH ONCE DAILY WITH A FULL GLASS OF  WATER 28 tablet 0  . Magnesium Gluconate (MAGNESIUM 27 PO) Take by mouth.    . metoprolol tartrate (LOPRESSOR) 25 MG tablet Take 0.5 tablets (12.5 mg total) by mouth 2 (two) times daily. 90 tablet 3  . Multiple Vitamin (MULTIVITAMIN) tablet Take 1 tablet by mouth daily.    . potassium chloride (K-DUR) 10 MEQ tablet Take 10 mEq by mouth daily.    Marland Kitchen telmisartan-hydrochlorothiazide (MICARDIS HCT) 80-25 MG tablet Take 1 tablet by mouth daily. 90 tablet 3   No current facility-administered medications for this visit.     Allergies:   Allergies  Allergen Reactions  . Codeine Other (See Comments)    Fainted   . Latex Itching and Other (See Comments)    sensitivity      Physical Exam:           ECOG: 0    General appearance: Alert, awake without any distress. Head: Atraumatic without abnormalities Oropharynx: Without any thrush or ulcers. Eyes: No scleral icterus. Lymph nodes: No lymphadenopathy noted in the cervical, supraclavicular, or axillary nodes Heart:regular rate and rhythm, without any murmurs or gallops.   Lung: Clear to auscultation without any rhonchi, wheezes or dullness to percussion. Abdomin: Soft, nontender without any shifting dullness or ascites. Musculoskeletal: No clubbing or cyanosis. Neurological: No motor or sensory deficits. Skin: No rashes or lesions.          Lab Results: Lab Results  Component Value Date   WBC 21.1 (H) 07/24/2020   HGB 14.1 07/24/2020   HCT 42.4 07/24/2020   MCV 92.8 07/24/2020   PLT 286 07/24/2020     Chemistry      Component Value Date/Time   NA 142 04/17/2020 1013   NA 142 08/19/2017 1410   K 3.8 04/17/2020 1013   K 3.8 08/19/2017 1410   CL 104 04/17/2020 1013   CO2 28 04/17/2020 1013   CO2 29 08/19/2017 1410   BUN 15 04/17/2020 1013   BUN 19.9 08/19/2017 1410   CREATININE 0.88 04/17/2020 1013  CREATININE 0.8 08/19/2017 1410      Component Value Date/Time   CALCIUM 9.4 04/17/2020 1013   CALCIUM 9.9 08/19/2017 1410   ALKPHOS 78 04/17/2020 1013   ALKPHOS 89 08/19/2017 1410   AST 22 04/17/2020 1013   AST 20 08/19/2017 1410   ALT 21 04/17/2020 1013   ALT 14 08/19/2017 1410   BILITOT 0.7 04/17/2020 1013   BILITOT 0.60 08/19/2017 1410       Impression and Plan:  69 year old woman with:  1.  Stage I CLL presented in 2018 with lymphocytosis and adenopathy.  He is currently on ibrutinib with reasonable response to therapy with white cell count continues to trend towards normal range.  Laboratory data from today reviewed  and showed a white cell count of 21,000 with improvement since July 2021.  Her hemoglobin platelet count continues to be within normal range.  Complication associated with this treatment were discussed.  These complications including cardiac issues including palpitation and bleeding issues as well as bruising.  She is agreeable to continue at this time.  2.  Autoimmune considerations: No autoimmune thrombocytopenia or anemia noted at this time.  3.  Infectious considerations: Continue to reiterate the risk of infections for her.  She is up-to-date on her Covid vaccination and will get the flu vaccine in the near future.  4.  Fibroadenoma of the breast: No evidence of relapsed disease after surgical resection.  5. Follow-up: She will return in 3 months for a follow-up visit.  30 minutes were spent on this visit.  Time was dedicated to reviewing disease status, discussing treatment options and future plan of care review.  Alice Button, MD 10/21/20213:09 PM

## 2020-07-31 NOTE — Progress Notes (Signed)
69 y.o. G3P3 Married White or Caucasian female here for breast and pelvic exam.  I am also following her for h/o depression and her bone density.   H/o CLL that is followed by Dr. Alen Blew.  She is being treated for the CLL at this point.  Last WBC ct was 21K.  Has some fatigue in the afternoon from the medication.    Family is doing well.    Denies vaginal bleeding.  Has not had Hep C testing.    Patient's last menstrual period was 08/05/2007.          Sexually active: No.  H/O STD:  no  Health Maintenance: PCP:  [name]  Last wellness appt was over a yr ago  Did blood work at that appt:  Did bloodwork with other providers at different appts.  Hep C screening recommended.  Vaccines are up to date:  Has done Covid booster, has gotten flu shot and prevnar vaccination Colonoscopy:  4/18 polyps f/u 5 yrs MMG:  06-16-2020 category c density birads 1:neg BMD:  10-20-12 normal Last pap smear:  10-28-16 neg HR HPV neg. H/o abnormal pap smear: no    reports that she has never smoked. She has never used smokeless tobacco. She reports previous alcohol use. She reports that she does not use drugs.  Past Medical History:  Diagnosis Date  . ADD (attention deficit disorder)   . CLL (chronic lymphocytic leukemia) ([place])   . Dyslipidemia   . Endometriosis   . Hematuria    h/o asymptomatic  . HTN (hypertension)   . PONV (postoperative nausea and vomiting)     Past Surgical History:  Procedure Laterality Date  . LAPAROSCOPY     endometriosis  . RADIOACTIVE SEED GUIDED EXCISIONAL BREAST BIOPSY Left 09/13/2017   Procedure: RADIOACTIVE SEED GUIDED EXCISIONAL BREAST BIOPSY;  Surgeon: Rolm Bookbinder, MD;  Location: [facility];  Service: General;  Laterality: Left;    Current Outpatient Medications  Medication Sig Dispense Refill  . buPROPion (WELLBUTRIN XL) 300 MG 24 hr tablet Take 300 mg by mouth every morning.    Marland Kitchen CALCIUM PO Take 1 tablet by mouth daily. PATIENT NOT SURE  OF DOSAGE    . FLUoxetine (PROZAC) 20 MG tablet TAKE 1 TABLET(20 MG) BY MOUTH DAILY 90 tablet 2  . IMBRUVICA 420 MG TABS TAKE 1 TABLET BY MOUTH ONCE DAILY WITH A FULL GLASS OF  WATER 28 tablet 0  . Magnesium Gluconate (MAGNESIUM 27 PO) Take by mouth.    . metoprolol tartrate (LOPRESSOR) 25 MG tablet Take 0.5 tablets (12.5 mg total) by mouth 2 (two) times daily. 90 tablet 3  . Multiple Vitamin (MULTIVITAMIN) tablet Take 1 tablet by mouth daily.    . potassium chloride (K-DUR) 10 MEQ tablet Take 10 mEq by mouth daily.    Marland Kitchen telmisartan-hydrochlorothiazide (MICARDIS HCT) 80-25 MG tablet TAKE 1 TABLET BY MOUTH DAILY 90 tablet 3   No current facility-administered medications for this visit.    Family History  Problem Relation Age of Onset  . Heart failure Mother   . Diabetes Father   . Other Brother        bilateral bladder  . Colon cancer Neg Hx     Review of Systems  Constitutional: Negative.   HENT: Negative.   Eyes: Negative.   Respiratory: Negative.   Cardiovascular: Negative.   Gastrointestinal: Negative.   Endocrine: Negative.   Genitourinary: Negative.   Musculoskeletal: Negative.   Skin: Negative.   Allergic/Immunologic:  Negative.   Neurological: Negative.   Hematological: Negative.   Psychiatric/Behavioral: Negative.     Exam:   BP 114/70   Pulse 70   Resp 16   Ht 5' 2.25" (1.581 m)   Wt 141 lb (64 kg)   LMP 08/05/2007   BMI 25.58 kg/m   Height: 5' 2.25" (158.1 cm)  General appearance: alert, cooperative and appears stated age Breasts: normal appearance, no masses or tenderness Abdomen: soft, non-tender; bowel sounds normal; no masses,  no organomegaly Lymph nodes: Cervical, supraclavicular, and axillary nodes normal.  No abnormal inguinal nodes palpated Neurologic: Grossly normal  Pelvic: External genitalia:  no lesions              Urethra:  normal appearing urethra with no masses, tenderness or lesions              Bartholins and Skenes: normal                  Vagina: normal appearing vagina with normal color and discharge, no lesions              Cervix: no lesions              Pap taken: No. Bimanual Exam:  Uterus:  normal size, contour, position, consistency, mobility, non-tender              Adnexa: normal adnexa and no mass, fullness, tenderness               Rectovaginal: Confirms               Anus:  normal sphincter tone, no lesions  Chaperone, Olene Floss, CMA, was present for exam.  A:  Breast and Pelvic exam PMP, no HRT Hypertension Depression CLL followed by Dr. 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  Doing yearly.  Breast MRI discussed due to hx of sclerosing lesion and increased lifetime risk of breast cancer pap smear guidelines reviewed.  Pap smear not obtained today.  She and I discussed guidelines BMD order placed today. Pt will see Dr. Forde Dandy this next year and have blood work done Return for breast and pelvic exam 1 year  26 minutes of total time was spent for this patient encounter, including preparation, face-to-face counseling with the patient and coordination of care, and documentation of the encounter.

## 2020-08-01 ENCOUNTER — Ambulatory Visit (INDEPENDENT_AMBULATORY_CARE_PROVIDER_SITE_OTHER): Payer: 59 | Admitting: Obstetrics & Gynecology

## 2020-08-01 ENCOUNTER — Other Ambulatory Visit: Payer: Self-pay

## 2020-08-01 ENCOUNTER — Encounter: Payer: Self-pay | Admitting: Obstetrics & Gynecology

## 2020-08-01 VITALS — BP 114/70 | HR 70 | Resp 16 | Ht 62.25 in | Wt 141.0 lb

## 2020-08-01 DIAGNOSIS — E2839 Other primary ovarian failure: Secondary | ICD-10-CM

## 2020-08-01 DIAGNOSIS — N6489 Other specified disorders of breast: Secondary | ICD-10-CM | POA: Diagnosis not present

## 2020-08-01 DIAGNOSIS — Z01419 Encounter for gynecological examination (general) (routine) without abnormal findings: Secondary | ICD-10-CM

## 2020-08-18 ENCOUNTER — Other Ambulatory Visit: Payer: Self-pay | Admitting: Oncology

## 2020-08-18 DIAGNOSIS — C9110 Chronic lymphocytic leukemia of B-cell type not having achieved remission: Secondary | ICD-10-CM

## 2020-08-29 NOTE — Progress Notes (Signed)
Cardiology Office Note:    Date:  09/03/2020   ID:  CALLEIGH LAFONTANT, DOB 28-Aug-1951, MRN [id number]  PCP:  [name], MD  Cardiologist:  Dr. Rayon Mcchristian [place]   Electrophysiologist:  n/a  Referring MD: [name], MD   No chief complaint on file.   History of Present Illness:    Alice Pollard is a 69 y.o. female with a hx of HTN, HL, PVCs.  Seen in 2017.  Her ECG demonstrated asymptomatic PVCs in a bigeminal pattern. Echocardiogram demonstrated normal LV function. Patient was placed on Metoprolol tartrate 12.5 bid.    Seen by Richardson Dopp PAC in July 2017.  She never started the Metoprolol. This was resumed and a follow up Holter done in August. This showed PACs and PVCs with low arrhythmia burden with only 452 PVCs in 24 hours.   She has been diagnosed with stage 1 CLL and is followed by oncology. On Imbruvica.  On follow up today she is doing very well. Denies any chest pain, palpitations, dizziness or SOB. Tolerating medication well. BP control has been excellent.  Past Medical History:  Diagnosis Date  . ADD (attention deficit disorder)   . CLL (chronic lymphocytic leukemia) ([place])   . Dyslipidemia   . Endometriosis   . Hematuria    h/o asymptomatic  . HTN (hypertension)   . PONV (postoperative nausea and vomiting)     Past Surgical History:  Procedure Laterality Date  . LAPAROSCOPY     endometriosis  . RADIOACTIVE SEED GUIDED EXCISIONAL BREAST BIOPSY Left 09/13/2017   Procedure: RADIOACTIVE SEED GUIDED EXCISIONAL BREAST BIOPSY;  Surgeon: Rolm Bookbinder, MD;  Location: [facility];  Service: General;  Laterality: Left;    Current Medications: Outpatient Medications Prior to Visit  Medication Sig Dispense Refill  . buPROPion (WELLBUTRIN XL) 300 MG 24 hr tablet Take 300 mg by mouth every morning.    Marland Kitchen CALCIUM PO Take 600 mg by mouth daily. PATIENT NOT SURE OF DOSAGE    . FLUoxetine (PROZAC) 20 MG tablet TAKE 1 TABLET(20 MG) BY MOUTH DAILY 90  tablet 2  . IMBRUVICA 420 MG TABS TAKE 1 TABLET BY MOUTH ONCE DAILY WITH A FULL GLASS OF  WATER 28 tablet 0  . Magnesium Gluconate (MAGNESIUM 27 PO) Take by mouth.    . Multiple Vitamin (MULTIVITAMIN) tablet Take 1 tablet by mouth daily.    . potassium chloride (K-DUR) 10 MEQ tablet Take 10 mEq by mouth daily.    . metoprolol tartrate (LOPRESSOR) 25 MG tablet Take 0.5 tablets (12.5 mg total) by mouth 2 (two) times daily. 90 tablet 3  . telmisartan-hydrochlorothiazide (MICARDIS HCT) 80-25 MG tablet TAKE 1 TABLET BY MOUTH DAILY 90 tablet 3   No facility-administered medications prior to visit.      Allergies:   Codeine and Latex   Social History   Socioeconomic History  . Marital status: Married    Spouse name: Not on file  . Number of children: 3  . Years of education: Not on file  . Highest education level: Not on file  Occupational History  . Occupation: Journalist, newspaper  Tobacco Use  . Smoking status: Never Smoker  . Smokeless tobacco: Never Used  Vaping Use  . Vaping Use: Never used  Substance and Sexual Activity  . Alcohol use: Not Currently  . Drug use: No  . Sexual activity: Not Currently    Partners: Male    Birth control/protection: Post-menopausal    Comment: vasectomy  Other Topics Concern  . Not on file  Social History Narrative  . Not on file   Social Determinants of Health   Financial Resource Strain:   . Difficulty of Paying Living Expenses: Not on file  Food Insecurity:   . Worried About Charity fundraiser in the Last Year: Not on file  . Ran Out of Food in the Last Year: Not on file  Transportation Needs:   . Lack of Transportation (Medical): Not on file  . Lack of Transportation (Non-Medical): Not on file  Physical Activity:   . Days of Exercise per Week: Not on file  . Minutes of Exercise per Session: Not on file  Stress:   . Feeling of Stress : Not on file  Social Connections:   . Frequency of Communication with Friends and Family: Not on  file  . Frequency of Social Gatherings with Friends and Family: Not on file  . Attends Religious Services: Not on file  . Active Member of Clubs or Organizations: Not on file  . Attends Archivist Meetings: Not on file  . Marital Status: Not on file     Family History:  The patient's family history includes Diabetes in her father; Heart failure in her mother; Other in her brother.   ROS:   Please see the history of present illness.    ROS All other systems reviewed and are negative.   Physical Exam:    VS:  BP 96/70 (BP Location: Left Arm, Patient Position: Sitting)   Pulse 71   Ht 5' 2.5" (1.588 m)   Wt 143 lb 9.6 oz (65.1 kg)   LMP 08/05/2007   SpO2 96%   BMI 25.85 kg/m     Wt Readings from Last 3 Encounters:  09/03/20 143 lb 9.6 oz (65.1 kg)  08/01/20 141 lb (64 kg)  04/17/20 144 lb 8 oz (65.5 kg)    GENERAL:  Well appearing WF in NAD HEENT:  PERRL, EOMI, sclera are clear. Oropharynx is clear. NECK:  No jugular venous distention, carotid upstroke brisk and symmetric, no bruits, no thyromegaly or adenopathy LUNGS:  Clear to auscultation bilaterally CHEST:  Unremarkable HEART:  RRR with extrasystoles,  PMI not displaced or sustained,S1 and S2 within normal limits, no S3, no S4: no clicks, no rubs, no murmurs ABD:  Soft, nontender. BS +, no masses or bruits. No hepatomegaly, no splenomegaly EXT:  2 + pulses throughout, no edema, no cyanosis no clubbing SKIN:  Warm and dry.  No rashes NEURO:  Alert and oriented x 3. Cranial nerves II through XII intact. PSYCH:  Cognitively intact     Studies/Labs Reviewed:   EKG:  EKG is  ordered today.  NSR with rate 71. Occ PVC.  Normal.  I have personally reviewed and interpreted this study.   Recent Labs: 07/24/2020: ALT 15; BUN 19; Creatinine 0.88; Hemoglobin 14.1; Platelet Count 286; Potassium 3.3; Sodium 141   Recent Lipid Panel    Component Value Date/Time   CHOL 225 (H) 04/16/2016 1002   TRIG 257 (H)  04/16/2016 1002   HDL 56 04/16/2016 1002   CHOLHDL 4.0 04/16/2016 1002   VLDL 51 (H) 04/16/2016 1002   LDLCALC 118 04/16/2016 1002   LDLDIRECT 121.6 01/15/2013 0915   Labs dated 01/16/18: cholesterol 305, triglycerides 461, HDL 56, LDL 157.    Additional studies/ records that were reviewed today include:   Echo 04/16/16 Vigorous LVEF, EF 65-70%, normal wall motion, grade 1 diastolic dysfunction, trivial MR  ETT-Echo 3/08 Normal   Holter monitor 05/25/16 showed occ. PVCs and rare PACs. Arrhythmia burden was quite low with total PVCs of only 452.  ASSESSMENT:    1. PVC (premature ventricular contraction)   2. Essential hypertension    PLAN:    In order of problems listed above:  1. PVCs - by prior Holter monitor she had low burden on metoprolol.  She is asymptomatic. EF is normal on Echo 2017.  Continue current therapy. Will consolidate metoprolol to Toprol XL 25 mg daily.  2. HTN - BP well controlled.  Will discontinue HCT and continue Telmisartan 80 mg     Medication Adjustments/Labs and Tests Ordered: Current medicines are reviewed at length with the patient today.  Concerns regarding medicines are outlined above.  Medication changes, Labs and Tests ordered today are outlined in the Patient Instructions noted below. Patient Instructions  We will change telmisartan HCT to just plain telmisartan 80 mg daily  We will change metoprolol to Toprol XL 25 mg once a day       Signed, Kollen Armenti Martinique, MD  09/03/2020 3:18 PM    [facility] [place], [place], [place]  [address] Phone: [phone]; Fax: [phone]

## 2020-09-03 ENCOUNTER — Ambulatory Visit (INDEPENDENT_AMBULATORY_CARE_PROVIDER_SITE_OTHER): Payer: 59 | Admitting: Cardiology

## 2020-09-03 ENCOUNTER — Other Ambulatory Visit: Payer: Self-pay

## 2020-09-03 ENCOUNTER — Encounter: Payer: Self-pay | Admitting: Cardiology

## 2020-09-03 VITALS — BP 96/70 | HR 71 | Ht 62.5 in | Wt 143.6 lb

## 2020-09-03 DIAGNOSIS — I493 Ventricular premature depolarization: Secondary | ICD-10-CM | POA: Diagnosis not present

## 2020-09-03 DIAGNOSIS — I10 Essential (primary) hypertension: Secondary | ICD-10-CM

## 2020-09-03 MED ORDER — METOPROLOL SUCCINATE ER 25 MG PO TB24: 25.0000 mg | ORAL_TABLET | Freq: Every day | ORAL | 3 refills | Status: DC

## 2020-09-03 MED ORDER — TELMISARTAN 80 MG PO TABS: 80.0000 mg | ORAL_TABLET | Freq: Every day | ORAL | 3 refills | Status: DC

## 2020-09-03 NOTE — Patient Instructions (Signed)
We will change telmisartan HCT to just plain telmisartan 80 mg daily  We will change metoprolol to Toprol XL 25 mg once a day

## 2020-09-08 ENCOUNTER — Other Ambulatory Visit: Payer: Self-pay | Admitting: Oncology

## 2020-09-08 DIAGNOSIS — C9110 Chronic lymphocytic leukemia of B-cell type not having achieved remission: Secondary | ICD-10-CM

## 2020-09-25 ENCOUNTER — Other Ambulatory Visit: Payer: Self-pay | Admitting: Cardiology

## 2020-09-28 ENCOUNTER — Other Ambulatory Visit: Payer: Self-pay | Admitting: Oncology

## 2020-09-28 DIAGNOSIS — C9110 Chronic lymphocytic leukemia of B-cell type not having achieved remission: Secondary | ICD-10-CM

## 2020-10-22 ENCOUNTER — Inpatient Hospital Stay: Payer: 59 | Attending: Oncology

## 2020-10-22 ENCOUNTER — Ambulatory Visit: Payer: 59 | Admitting: Oncology

## 2020-10-22 ENCOUNTER — Inpatient Hospital Stay (HOSPITAL_BASED_OUTPATIENT_CLINIC_OR_DEPARTMENT_OTHER): Payer: 59 | Admitting: Oncology

## 2020-10-22 ENCOUNTER — Other Ambulatory Visit: Payer: 59

## 2020-10-22 ENCOUNTER — Inpatient Hospital Stay: Payer: 59 | Admitting: Oncology

## 2020-10-22 ENCOUNTER — Other Ambulatory Visit: Payer: Self-pay

## 2020-10-22 ENCOUNTER — Inpatient Hospital Stay: Payer: 59

## 2020-10-22 VITALS — BP 143/85 | HR 74 | Temp 98.1°F | Resp 20 | Ht 62.0 in | Wt 149.5 lb

## 2020-10-22 DIAGNOSIS — C9110 Chronic lymphocytic leukemia of B-cell type not having achieved remission: Secondary | ICD-10-CM | POA: Diagnosis present

## 2020-10-22 DIAGNOSIS — R5383 Other fatigue: Secondary | ICD-10-CM | POA: Insufficient documentation

## 2020-10-22 DIAGNOSIS — Z79899 Other long term (current) drug therapy: Secondary | ICD-10-CM | POA: Insufficient documentation

## 2020-10-22 DIAGNOSIS — D242 Benign neoplasm of left breast: Secondary | ICD-10-CM | POA: Insufficient documentation

## 2020-10-22 LAB — CBC WITH DIFFERENTIAL (CANCER CENTER ONLY)
Abs Immature Granulocytes: 0.04 10*3/uL (ref 0.00–0.07)
Basophils Absolute: 0.1 10*3/uL (ref 0.0–0.1)
Basophils Relative: 0 %
Eosinophils Absolute: 0.1 10*3/uL (ref 0.0–0.5)
Eosinophils Relative: 1 %
HCT: 42.5 % (ref 36.0–46.0)
Hemoglobin: 14.3 g/dL (ref 12.0–15.0)
Immature Granulocytes: 0 %
Lymphocytes Relative: 46 %
Lymphs Abs: 7.1 10*3/uL — ABNORMAL HIGH (ref 0.7–4.0)
MCH: 31.4 pg (ref 26.0–34.0)
MCHC: 33.6 g/dL (ref 30.0–36.0)
MCV: 93.4 fL (ref 80.0–100.0)
Monocytes Absolute: 0.9 10*3/uL (ref 0.1–1.0)
Monocytes Relative: 6 %
Neutro Abs: 7.3 10*3/uL (ref 1.7–7.7)
Neutrophils Relative %: 47 %
Platelet Count: 249 10*3/uL (ref 150–400)
RBC: 4.55 MIL/uL (ref 3.87–5.11)
RDW: 12.9 % (ref 11.5–15.5)
WBC Count: 15.5 10*3/uL — ABNORMAL HIGH (ref 4.0–10.5)
nRBC: 0 % (ref 0.0–0.2)

## 2020-10-22 LAB — CMP (CANCER CENTER ONLY)
ALT: 15 U/L (ref 0–44)
AST: 21 U/L (ref 15–41)
Albumin: 4 g/dL (ref 3.5–5.0)
Alkaline Phosphatase: 77 U/L (ref 38–126)
Anion gap: 12 (ref 5–15)
BUN: 12 mg/dL (ref 8–23)
CO2: 26 mmol/L (ref 22–32)
Calcium: 9 mg/dL (ref 8.9–10.3)
Chloride: 105 mmol/L (ref 98–111)
Creatinine: 0.86 mg/dL (ref 0.44–1.00)
GFR, Estimated: >60 mL/min (ref >60)
Glucose, Bld: 91 mg/dL (ref 70–99)
Potassium: 3.5 mmol/L (ref 3.5–5.1)
Sodium: 143 mmol/L (ref 135–145)
Total Bilirubin: 0.8 mg/dL (ref 0.3–1.2)
Total Protein: 6.4 g/dL — ABNORMAL LOW (ref 6.5–8.1)

## 2020-10-22 NOTE — Progress Notes (Signed)
Hematology and Oncology Follow Up Visit  Alice Pollard [id number] [birth year] 70 y.o. 10/22/2020 2:51 PM Alice Pollard, MDSouth, Alice Main, MD   Principle Diagnosis: 70 year old woman with stage I CLL diagnosed with lymphocytosis and adenopathy in 2018.      Secondary diagnosis: Fibroadenoma of the left breast diagnosed in 2018.  No evidence of relapse after surgical resection.   Current therapy: Ibrutinib 420 mg started in December 2020.   Interim History: Alice Pollard returns today for a follow-up.  Since the last visit, she reports no major changes in her health.  She continues to tolerate ibrutinib without any recent complaints.  He does report some mild fatigue and tiredness but no recent hospitalization or illnesses.  Her performance status quality of life remained excellent.  He denies any bleeding complications including epistaxis or bruising.  She denies any lymphadenopathy or constitutional symptoms.       Medications: Reviewed without changes. Current Outpatient Medications  Medication Sig Dispense Refill  . buPROPion (WELLBUTRIN XL) 300 MG 24 hr tablet Take 300 mg by mouth every morning.    Marland Kitchen CALCIUM PO Take 600 mg by mouth daily. PATIENT NOT SURE OF DOSAGE    . FLUoxetine (PROZAC) 20 MG tablet TAKE 1 TABLET(20 MG) BY MOUTH DAILY 90 tablet 2  . IMBRUVICA 420 MG TABS TAKE 1 TABLET BY MOUTH ONCE DAILY WITH A FULL GLASS OF  WATER 28 tablet 0  . Magnesium Gluconate (MAGNESIUM 27 PO) Take by mouth.    . metoprolol succinate (TOPROL XL) 25 MG 24 hr tablet Take 1 tablet (25 mg total) by mouth daily. 90 tablet 3  . Multiple Vitamin (MULTIVITAMIN) tablet Take 1 tablet by mouth daily.    . potassium chloride (K-DUR) 10 MEQ tablet Take 10 mEq by mouth daily.    Marland Kitchen telmisartan (MICARDIS) 80 MG tablet Take 1 tablet (80 mg total) by mouth daily. 90 tablet 3   No current facility-administered medications for this visit.     Allergies:  Allergies  Allergen Reactions  . Codeine Other  (See Comments)    Fainted   . Latex Itching and Other (See Comments)    sensitivity      Physical Exam:       Blood pressure (!) 143/85, pulse 74, temperature 98.1 F (36.7 C), temperature source Tympanic, resp. rate 20, height 5\' 2"  (1.575 m), weight 149 lb 8 oz (67.8 kg), last menstrual period 08/05/2007, SpO2 98 %.      ECOG: 0    General appearance: Comfortable appearing without any discomfort Head: Normocephalic without any trauma Oropharynx: Mucous membranes are moist and pink without any thrush or ulcers. Eyes: Pupils are equal and round reactive to light. Lymph nodes: No cervical, supraclavicular, inguinal or axillary lymphadenopathy.   Heart:regular rate and rhythm.  S1 and S2 without leg edema. Lung: Clear without any rhonchi or wheezes.  No dullness to percussion. Abdomin: Soft, nontender, nondistended with good bowel sounds.  No hepatosplenomegaly. Musculoskeletal: No joint deformity or effusion.  Full range of motion noted. Neurological: No deficits noted on motor, sensory and deep tendon reflex exam. Skin: No petechial rash or dryness.  Appeared moist.            Lab Results: Lab Results  Component Value Date   WBC 21.1 (H) 07/24/2020   HGB 14.1 07/24/2020   HCT 42.4 07/24/2020   MCV 92.8 07/24/2020   PLT 286 07/24/2020     Chemistry      Component Value Date/Time  NA 141 07/24/2020 1458   NA 142 08/19/2017 1410   K 3.3 (L) 07/24/2020 1458   K 3.8 08/19/2017 1410   CL 104 07/24/2020 1458   CO2 28 07/24/2020 1458   CO2 29 08/19/2017 1410   BUN 19 07/24/2020 1458   BUN 19.9 08/19/2017 1410   CREATININE 0.88 07/24/2020 1458   CREATININE 0.8 08/19/2017 1410      Component Value Date/Time   CALCIUM 9.2 07/24/2020 1458   CALCIUM 9.9 08/19/2017 1410   ALKPHOS 71 07/24/2020 1458   ALKPHOS 89 08/19/2017 1410   AST 19 07/24/2020 1458   AST 20 08/19/2017 1410   ALT 15 07/24/2020 1458   ALT 14 08/19/2017 1410   BILITOT 0.5 07/24/2020  1458   BILITOT 0.60 08/19/2017 1410       Impression and Plan:  70 year old woman with:  1.  CLL diagnosed in 2018.  She presented with stage I disease including lymphocytosis and adenopathy.  She has tolerated ibrutinib without any major complaints at this time.  She denies any recent complications including bleeding issues.  Laboratory data continues to show improvement in her white cell count.  Risks and benefits of continuing ibrutinib were reviewed at this time.  Complication clinic cardiac arrhythmia, palpitation as well as bleeding were reiterated.  He is agreeable to continue at this time.  Her CBC reviewed from today and continues to show improvement in her counts.  2.  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  3.  Infectious considerations: We continue to discuss her immune deficiencies associated with CLL and importance of follow-up updating her immunization.  She has completed their COVID vaccination series including a booster.  4.  Fibroadenoma of the breast: She is up-to-date on her mammography check continue to recommend at this time.  5. Follow-up: 3 months for repeat follow-up.  30 minutes were dedicated to this encounter.  The time was spent on reviewing disease status, discussing treatment options and outlining future plan of care.  Zola Button, MD 1/19/20222:51 PM

## 2020-10-31 ENCOUNTER — Other Ambulatory Visit: Payer: Self-pay | Admitting: Oncology

## 2020-10-31 DIAGNOSIS — C9110 Chronic lymphocytic leukemia of B-cell type not having achieved remission: Secondary | ICD-10-CM

## 2020-11-21 ENCOUNTER — Other Ambulatory Visit: Payer: Self-pay | Admitting: Oncology

## 2020-11-21 DIAGNOSIS — C9110 Chronic lymphocytic leukemia of B-cell type not having achieved remission: Secondary | ICD-10-CM

## 2020-11-21 NOTE — Telephone Encounter (Signed)
Refill equest

## 2020-11-26 ENCOUNTER — Telehealth: Payer: Self-pay

## 2020-11-26 NOTE — Telephone Encounter (Signed)
-----   Message from Wyatt Portela, MD sent at 11/26/2020 10:40 AM EST ----- Ok. Noted.  ----- Message ----- From: Tami Lin, RN Sent: 11/26/2020  10:38 AM EST To: Wyatt Portela, MD  Patient called to make you aware she has a quarter sized red rash on her back and lower leg. She said she has used cortisone which has helped but she just wanted to make you aware. She said the areas near the rash were itchy but not anymore. She said she has had rashes before but the area is larger this time. Patient's next appointment is in April. Lanelle Bal

## 2020-12-15 ENCOUNTER — Other Ambulatory Visit: Payer: Self-pay | Admitting: Oncology

## 2020-12-15 DIAGNOSIS — C9110 Chronic lymphocytic leukemia of B-cell type not having achieved remission: Secondary | ICD-10-CM

## 2021-01-20 ENCOUNTER — Inpatient Hospital Stay: Payer: 59 | Attending: Oncology

## 2021-01-20 ENCOUNTER — Other Ambulatory Visit: Payer: Self-pay

## 2021-01-20 ENCOUNTER — Inpatient Hospital Stay (HOSPITAL_BASED_OUTPATIENT_CLINIC_OR_DEPARTMENT_OTHER): Payer: 59 | Admitting: Oncology

## 2021-01-20 VITALS — BP 125/90 | HR 65 | Temp 97.0°F | Resp 18 | Wt 148.6 lb

## 2021-01-20 DIAGNOSIS — C9110 Chronic lymphocytic leukemia of B-cell type not having achieved remission: Secondary | ICD-10-CM | POA: Insufficient documentation

## 2021-01-20 DIAGNOSIS — Z79899 Other long term (current) drug therapy: Secondary | ICD-10-CM | POA: Insufficient documentation

## 2021-01-20 DIAGNOSIS — D242 Benign neoplasm of left breast: Secondary | ICD-10-CM | POA: Insufficient documentation

## 2021-01-20 LAB — CBC WITH DIFFERENTIAL (CANCER CENTER ONLY)
Abs Immature Granulocytes: 0.04 10*3/uL (ref 0.00–0.07)
Basophils Absolute: 0.1 10*3/uL (ref 0.0–0.1)
Basophils Relative: 1 %
Eosinophils Absolute: 0 10*3/uL (ref 0.0–0.5)
Eosinophils Relative: 0 %
HCT: 42.5 % (ref 36.0–46.0)
Hemoglobin: 14.2 g/dL (ref 12.0–15.0)
Immature Granulocytes: 0 %
Lymphocytes Relative: 40 %
Lymphs Abs: 4.9 10*3/uL — ABNORMAL HIGH (ref 0.7–4.0)
MCH: 31 pg (ref 26.0–34.0)
MCHC: 33.4 g/dL (ref 30.0–36.0)
MCV: 92.8 fL (ref 80.0–100.0)
Monocytes Absolute: 1 10*3/uL (ref 0.1–1.0)
Monocytes Relative: 8 %
Neutro Abs: 6.5 10*3/uL (ref 1.7–7.7)
Neutrophils Relative %: 51 %
Platelet Count: 286 10*3/uL (ref 150–400)
RBC: 4.58 MIL/uL (ref 3.87–5.11)
RDW: 13.1 % (ref 11.5–15.5)
WBC Count: 12.5 10*3/uL — ABNORMAL HIGH (ref 4.0–10.5)
nRBC: 0 % (ref 0.0–0.2)

## 2021-01-20 LAB — CMP (CANCER CENTER ONLY)
ALT: 12 U/L (ref 0–44)
AST: 20 U/L (ref 15–41)
Albumin: 4 g/dL (ref 3.5–5.0)
Alkaline Phosphatase: 69 U/L (ref 38–126)
Anion gap: 10 (ref 5–15)
BUN: 15 mg/dL (ref 8–23)
CO2: 27 mmol/L (ref 22–32)
Calcium: 9.1 mg/dL (ref 8.9–10.3)
Chloride: 105 mmol/L (ref 98–111)
Creatinine: 0.82 mg/dL (ref 0.44–1.00)
GFR, Estimated: >60 mL/min (ref >60)
Glucose, Bld: 82 mg/dL (ref 70–99)
Potassium: 3.5 mmol/L (ref 3.5–5.1)
Sodium: 142 mmol/L (ref 135–145)
Total Bilirubin: 1.1 mg/dL (ref 0.3–1.2)
Total Protein: 6.4 g/dL — ABNORMAL LOW (ref 6.5–8.1)

## 2021-01-20 NOTE — Progress Notes (Signed)
Hematology and Oncology Follow Up Visit  Alice Pollard [id number] 03/28/1951 70 y.o. 01/20/2021 9:54 AM Alice Pollard, MDSouth, Alice Main, MD   Principle Diagnosis: 70 year old woman with CLL diagnosed in 2018.  She was found to have stage I with lymphocytosis and adenopathy.   Secondary diagnosis: Fibroadenoma of the left breast diagnosed in 2018.  No evidence of relapse after surgical resection.   Current therapy: Ibrutinib 420 mg started in December 2020.   Interim History: Alice Pollard presents today for return evaluation.  Since last visit, she reports no major changes in her health.  She did develop a rash on her lower extremity that has resolved at this time.  She is reporting more sinus congestion, conjunctival irritation without any fevers or chills.  She denies any recent hospitalization or illnesses.  She denies any complications related to ibrutinib.       Medications: Reviewed without changes. Current Outpatient Medications  Medication Sig Dispense Refill  . buPROPion (WELLBUTRIN XL) 300 MG 24 hr tablet Take 300 mg by mouth every morning.    Marland Kitchen CALCIUM PO Take 600 mg by mouth daily. PATIENT NOT SURE OF DOSAGE    . FLUoxetine (PROZAC) 20 MG tablet TAKE 1 TABLET(20 MG) BY MOUTH DAILY 90 tablet 2  . IMBRUVICA 420 MG TABS TAKE 1 TABLET BY MOUTH ONCE DAILY WITH A FULL GLASS OF  WATER 28 tablet 0  . Magnesium Gluconate (MAGNESIUM 27 PO) Take by mouth.    . metoprolol succinate (TOPROL XL) 25 MG 24 hr tablet Take 1 tablet (25 mg total) by mouth daily. 90 tablet 3  . Multiple Vitamin (MULTIVITAMIN) tablet Take 1 tablet by mouth daily.    . potassium chloride (K-DUR) 10 MEQ tablet Take 10 mEq by mouth daily.    Marland Kitchen telmisartan (MICARDIS) 80 MG tablet Take 1 tablet (80 mg total) by mouth daily. 90 tablet 3   No current facility-administered medications for this visit.     Allergies:  Allergies  Allergen Reactions  . Codeine Other (See Comments)    Fainted   . Latex Itching and  Other (See Comments)    sensitivity      Physical Exam:       Blood pressure 125/90, pulse 65, temperature (!) 97 F (36.1 C), temperature source Tympanic, resp. rate 18, weight 148 lb 9.6 oz (67.4 kg), last menstrual period 08/05/2007, SpO2 99 %.      ECOG: 0    General appearance: Alert, awake without any distress. Head: Atraumatic without abnormalities Oropharynx: Without any thrush or ulcers. Eyes: No scleral icterus. Lymph nodes: No lymphadenopathy noted in the cervical, supraclavicular, or axillary nodes Heart:regular rate and rhythm, without any murmurs or gallops.   Lung: Clear to auscultation without any rhonchi, wheezes or dullness to percussion. Abdomin: Soft, nontender without any shifting dullness or ascites. Musculoskeletal: No clubbing or cyanosis. Neurological: No motor or sensory deficits. Skin: No rashes or lesions.           Lab Results: Lab Results  Component Value Date   WBC 12.5 (H) 01/20/2021   HGB 14.2 01/20/2021   HCT 42.5 01/20/2021   MCV 92.8 01/20/2021   PLT 286 01/20/2021     Chemistry      Component Value Date/Time   NA 143 10/22/2020 1453   NA 142 08/19/2017 1410   K 3.5 10/22/2020 1453   K 3.8 08/19/2017 1410   CL 105 10/22/2020 1453   CO2 26 10/22/2020 1453   CO2 29 08/19/2017 1410  BUN 12 10/22/2020 1453   BUN 19.9 08/19/2017 1410   CREATININE 0.86 10/22/2020 1453   CREATININE 0.8 08/19/2017 1410      Component Value Date/Time   CALCIUM 9.0 10/22/2020 1453   CALCIUM 9.9 08/19/2017 1410   ALKPHOS 77 10/22/2020 1453   ALKPHOS 89 08/19/2017 1410   AST 21 10/22/2020 1453   AST 20 08/19/2017 1410   ALT 15 10/22/2020 1453   ALT 14 08/19/2017 1410   BILITOT 0.8 10/22/2020 1453   BILITOT 0.60 08/19/2017 1410       Impression and Plan:  70 year old woman with:  1.  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   The natural course of this disease was reviewed at this time  and treatment options were discussed.  She is currently on ibrutinib without any major complaints.  She denies any complications including bleeding or worsening skin rash.  She denies any worsening edema.  He is complications were reiterated and alternative treatment options were reviewed.  At this time she is agreeable to continue.  Laboratory data from today continues to show excellent hematological response to therapy.  2.  Autoimmune considerations: I continue to educate her about autoimmune anemia and thrombocytopenia and she does not have any at this time.  3.  Infectious considerations: She is not experiencing any infections at this time.  Continue to monitor given her CLL and immunocompromise.  4.  Fibroadenoma of the breast: She will continue follow-up mammography and she remains up-to-date.  5. Follow-up: She will return in 3 months for repeat follow-up.  30 minutes were spent on this visit.  The time was dedicated to reviewing laboratory data, disease status update and outlining future plan of care.  Alice Button, MD 4/19/20229:54 AM

## 2021-01-30 ENCOUNTER — Other Ambulatory Visit: Payer: Self-pay | Admitting: Oncology

## 2021-01-30 DIAGNOSIS — C9110 Chronic lymphocytic leukemia of B-cell type not having achieved remission: Secondary | ICD-10-CM

## 2021-02-20 ENCOUNTER — Other Ambulatory Visit: Payer: Self-pay | Admitting: Oncology

## 2021-02-20 DIAGNOSIS — C9110 Chronic lymphocytic leukemia of B-cell type not having achieved remission: Secondary | ICD-10-CM

## 2021-02-23 ENCOUNTER — Other Ambulatory Visit (HOSPITAL_COMMUNITY): Payer: Self-pay

## 2021-02-26 ENCOUNTER — Other Ambulatory Visit: Payer: Self-pay | Admitting: *Deleted

## 2021-02-26 ENCOUNTER — Telehealth: Payer: Self-pay

## 2021-02-26 ENCOUNTER — Encounter: Payer: Self-pay | Admitting: *Deleted

## 2021-02-26 DIAGNOSIS — C9110 Chronic lymphocytic leukemia of B-cell type not having achieved remission: Secondary | ICD-10-CM

## 2021-02-26 MED ORDER — IMBRUVICA 420 MG PO TABS: ORAL_TABLET | ORAL | 0 refills | Status: DC

## 2021-02-26 NOTE — Telephone Encounter (Signed)
Oral Oncology Patient Advocate Encounter  Met patient in lobby to complete application for Alice Pollard and Alice Pollard in an effort to reduce patient's out of pocket expense for Imbruvica to $0.    Application completed and faxed to [phone].   [facility] patient assistance phone number for follow up is [phone].   This encounter will be updated until final determination.  [place] [place] [facility] [place] Phone [phone] Fax [phone] 02/26/2021 8:52 AM

## 2021-02-26 NOTE — Telephone Encounter (Signed)
Patient is approved for Imbruvica at no cost from [facility] 02/25/21-02/25/22  [facility]ewitt Hoes PAF uses [facility] [place] Phone [phone] Fax [phone] 02/26/2021 9:02 AM

## 2021-02-26 NOTE — Telephone Encounter (Signed)
Prescription for Imbruvica sent to [facility]. [facility] notified that Rx has been changed

## 2021-03-18 ENCOUNTER — Other Ambulatory Visit: Payer: Self-pay | Admitting: *Deleted

## 2021-03-18 DIAGNOSIS — C9110 Chronic lymphocytic leukemia of B-cell type not having achieved remission: Secondary | ICD-10-CM

## 2021-03-18 MED ORDER — IMBRUVICA 420 MG PO TABS
ORAL_TABLET | ORAL | 0 refills | Status: DC
Start: 2021-03-18 — End: 2021-04-07

## 2021-03-18 NOTE — Telephone Encounter (Signed)
Received faxed refill request from [facility] for [place]. Refilled via escribe per Dr. Hazeline Junker note 01/20/21

## 2021-03-31 ENCOUNTER — Telehealth: Payer: Self-pay | Admitting: Oncology

## 2021-03-31 NOTE — Telephone Encounter (Signed)
Called patient regarding upcoming July appointments, patient is notified. 

## 2021-04-07 ENCOUNTER — Other Ambulatory Visit: Payer: Self-pay | Admitting: *Deleted

## 2021-04-07 DIAGNOSIS — C9110 Chronic lymphocytic leukemia of B-cell type not having achieved remission: Secondary | ICD-10-CM

## 2021-04-07 MED ORDER — IMBRUVICA 420 MG PO TABS
ORAL_TABLET | ORAL | 0 refills | Status: DC
Start: 2021-04-07 — End: 2021-04-28

## 2021-04-22 ENCOUNTER — Other Ambulatory Visit: Payer: 59

## 2021-04-22 ENCOUNTER — Ambulatory Visit: Payer: 59 | Admitting: Oncology

## 2021-04-23 ENCOUNTER — Inpatient Hospital Stay (HOSPITAL_BASED_OUTPATIENT_CLINIC_OR_DEPARTMENT_OTHER): Payer: 59 | Admitting: Oncology

## 2021-04-23 ENCOUNTER — Other Ambulatory Visit: Payer: Self-pay

## 2021-04-23 ENCOUNTER — Inpatient Hospital Stay: Payer: 59 | Attending: Oncology

## 2021-04-23 VITALS — BP 153/79 | HR 69 | Temp 98.0°F | Resp 18 | Ht 62.0 in | Wt 148.3 lb

## 2021-04-23 DIAGNOSIS — C9110 Chronic lymphocytic leukemia of B-cell type not having achieved remission: Secondary | ICD-10-CM | POA: Insufficient documentation

## 2021-04-23 DIAGNOSIS — D242 Benign neoplasm of left breast: Secondary | ICD-10-CM | POA: Diagnosis not present

## 2021-04-23 DIAGNOSIS — Z79899 Other long term (current) drug therapy: Secondary | ICD-10-CM | POA: Diagnosis not present

## 2021-04-23 LAB — CBC WITH DIFFERENTIAL (CANCER CENTER ONLY)
Abs Immature Granulocytes: 0.04 10*3/uL (ref 0.00–0.07)
Basophils Absolute: 0.1 10*3/uL (ref 0.0–0.1)
Basophils Relative: 1 %
Eosinophils Absolute: 0.1 10*3/uL (ref 0.0–0.5)
Eosinophils Relative: 1 %
HCT: 41.9 % (ref 36.0–46.0)
Hemoglobin: 14.6 g/dL (ref 12.0–15.0)
Immature Granulocytes: 0 %
Lymphocytes Relative: 34 %
Lymphs Abs: 3.4 10*3/uL (ref 0.7–4.0)
MCH: 31.6 pg (ref 26.0–34.0)
MCHC: 34.8 g/dL (ref 30.0–36.0)
MCV: 90.7 fL (ref 80.0–100.0)
Monocytes Absolute: 0.8 10*3/uL (ref 0.1–1.0)
Monocytes Relative: 8 %
Neutro Abs: 5.7 10*3/uL (ref 1.7–7.7)
Neutrophils Relative %: 56 %
Platelet Count: 249 10*3/uL (ref 150–400)
RBC: 4.62 MIL/uL (ref 3.87–5.11)
RDW: 13.3 % (ref 11.5–15.5)
WBC Count: 9.9 10*3/uL (ref 4.0–10.5)
nRBC: 0 % (ref 0.0–0.2)

## 2021-04-23 LAB — CMP (CANCER CENTER ONLY)
ALT: 16 U/L (ref 0–44)
AST: 26 U/L (ref 15–41)
Albumin: 4.4 g/dL (ref 3.5–5.0)
Alkaline Phosphatase: 64 U/L (ref 38–126)
Anion gap: 7 (ref 5–15)
BUN: 19 mg/dL (ref 8–23)
CO2: 29 mmol/L (ref 22–32)
Calcium: 9.2 mg/dL (ref 8.9–10.3)
Chloride: 106 mmol/L (ref 98–111)
Creatinine: 0.9 mg/dL (ref 0.44–1.00)
GFR, Estimated: >60 mL/min (ref >60)
Glucose, Bld: 89 mg/dL (ref 70–99)
Potassium: 3.6 mmol/L (ref 3.5–5.1)
Sodium: 142 mmol/L (ref 135–145)
Total Bilirubin: 1.3 mg/dL — ABNORMAL HIGH (ref 0.3–1.2)
Total Protein: 6.5 g/dL (ref 6.5–8.1)

## 2021-04-23 NOTE — Progress Notes (Signed)
Hematology and Oncology Follow Up Visit  Alice Pollard [id number] 1951/01/08 70 y.o. 04/23/2021 1:08 PM [name], MDSouth, [name], MD   Principle Diagnosis: 70 year old woman with stage I CLL presented with lymphocytosis and adenopathy diagnosed in 2018.    Secondary diagnosis: Fibroadenoma of the left breast diagnosed in 2018.  No evidence of relapse after surgical resection.   Current therapy: Ibrutinib 420 mg started in December 2020.   Interim History: Ms. Birenbaum returns today for a follow-up evaluation.  Since the last visit, she reports feeling well without any major complaints.  She denies any nausea, vomiting or abdominal pain.  She denies any bleeding complications.  She denies any palpitation or hospitalizations.  He denies any painful adenopathy fevers or chills or excessive fatigue.  She continues to tolerate ibrutinib without any issues.       Medications: Updated on review. Current Outpatient Medications  Medication Sig Dispense Refill   buPROPion (WELLBUTRIN XL) 300 MG 24 hr tablet Take 300 mg by mouth every morning.     CALCIUM PO Take 600 mg by mouth daily. PATIENT NOT SURE OF DOSAGE     FLUoxetine (PROZAC) 20 MG tablet TAKE 1 TABLET(20 MG) BY MOUTH DAILY 90 tablet 2   ibrutinib (IMBRUVICA) 420 MG TABS TAKE 1 TABLET BY MOUTH ONCE DAILY WITH A FULL GLASS OF  WATER 28 tablet 0   Magnesium Gluconate (MAGNESIUM 27 PO) Take by mouth.     metoprolol succinate (TOPROL XL) 25 MG 24 hr tablet Take 1 tablet (25 mg total) by mouth daily. 90 tablet 3   Multiple Vitamin (MULTIVITAMIN) tablet Take 1 tablet by mouth daily.     potassium chloride (K-DUR) 10 MEQ tablet Take 10 mEq by mouth daily.     telmisartan (MICARDIS) 80 MG tablet Take 1 tablet (80 mg total) by mouth daily. 90 tablet 3   No current facility-administered medications for this visit.     Allergies:  Allergies  Allergen Reactions   Codeine Other (See Comments)    Fainted    Latex Itching and Other  (See Comments)    sensitivity   Other Other (See Comments)      Physical Exam:         Blood pressure (!) 153/79, pulse 69, temperature 98 F (36.7 C), temperature source Oral, resp. rate 18, height 5\' 2"  (1.575 m), weight 148 lb 4.8 oz (67.3 kg), last menstrual period 08/05/2007, SpO2 99 %.     ECOG: 0    General appearance: Comfortable appearing without any discomfort Head: Normocephalic without any trauma Oropharynx: Mucous membranes are moist and pink without any thrush or ulcers. Eyes: Pupils are equal and round reactive to light. Lymph nodes: No cervical, supraclavicular, inguinal or axillary lymphadenopathy.   Heart:regular rate and rhythm.  S1 and S2 without leg edema. Lung: Clear without any rhonchi or wheezes.  No dullness to percussion. Abdomin: Soft, nontender, nondistended with good bowel sounds.  No hepatosplenomegaly. Musculoskeletal: No joint deformity or effusion.  Full range of motion noted. Neurological: No deficits noted on motor, sensory and deep tendon reflex exam. Skin: No petechial rash or dryness.  Appeared moist.            Lab Results: Lab Results  Component Value Date   WBC 12.5 (H) 01/20/2021   HGB 14.2 01/20/2021   HCT 42.5 01/20/2021   MCV 92.8 01/20/2021   PLT 286 01/20/2021     Chemistry      Component Value Date/Time   NA  142 01/20/2021 0925   NA 142 08/19/2017 1410   K 3.5 01/20/2021 0925   K 3.8 08/19/2017 1410   CL 105 01/20/2021 0925   CO2 27 01/20/2021 0925   CO2 29 08/19/2017 1410   BUN 15 01/20/2021 0925   BUN 19.9 08/19/2017 1410   CREATININE 0.82 01/20/2021 0925   CREATININE 0.8 08/19/2017 1410      Component Value Date/Time   CALCIUM 9.1 01/20/2021 0925   CALCIUM 9.9 08/19/2017 1410   ALKPHOS 69 01/20/2021 0925   ALKPHOS 89 08/19/2017 1410   AST 20 01/20/2021 0925   AST 20 08/19/2017 1410   ALT 12 01/20/2021 0925   ALT 14 08/19/2017 1410   BILITOT 1.1 01/20/2021 0925   BILITOT 0.60 08/19/2017  1410       Impression and Plan:  70 year old woman with:  1.  CLL diagnosed in 2018.  She was found to have stage I CLL after presenting with lymphocytosis and adenopathy.  Her disease status was updated at this time and treatment options were discussed.  She continues to be on ibrutinib with excellent hematological response and near resolution of her leukocytosis.  Risks and benefits of continuing this treatment were discussed at this time.  Complications that include bleeding, cardiac arrhythmia among others were reviewed.  Laboratory data from today reviewed and showed a complete hematological response at this time without any complications.  I recommended continuing the same dose and schedule and she is agreeable.  2.  Autoimmune considerations: She has not experienced any autoimmune hemolysis or thrombocytopenia related to CLL.  3.  Infectious considerations: No opportunistic infections noted at this time.  We will continue to monitor.  4.  Fibroadenoma of the breast: She continues to be up-to-date on mammography.  5. Follow-up: In 3 months for repeat follow-up.  30 minutes were dedicated to this visit.  The time was spent on reviewing laboratory data, disease status update, treatment choices and complications of therapy.  Zola Button, MD 7/21/20221:08 PM

## 2021-04-28 ENCOUNTER — Other Ambulatory Visit: Payer: Self-pay | Admitting: *Deleted

## 2021-04-28 DIAGNOSIS — C9110 Chronic lymphocytic leukemia of B-cell type not having achieved remission: Secondary | ICD-10-CM

## 2021-04-28 MED ORDER — IMBRUVICA 420 MG PO TABS: ORAL_TABLET | ORAL | 0 refills | Status: DC

## 2021-05-18 ENCOUNTER — Other Ambulatory Visit: Payer: Self-pay | Admitting: *Deleted

## 2021-05-18 DIAGNOSIS — C9110 Chronic lymphocytic leukemia of B-cell type not having achieved remission: Secondary | ICD-10-CM

## 2021-05-18 MED ORDER — IMBRUVICA 420 MG PO TABS: ORAL_TABLET | ORAL | 0 refills | Status: DC

## 2021-06-09 ENCOUNTER — Other Ambulatory Visit: Payer: Self-pay | Admitting: *Deleted

## 2021-06-09 DIAGNOSIS — C9110 Chronic lymphocytic leukemia of B-cell type not having achieved remission: Secondary | ICD-10-CM

## 2021-06-09 MED ORDER — IMBRUVICA 420 MG PO TABS: ORAL_TABLET | ORAL | 0 refills | Status: DC

## 2021-07-21 ENCOUNTER — Inpatient Hospital Stay: Payer: 59 | Attending: Oncology

## 2021-07-21 ENCOUNTER — Inpatient Hospital Stay (HOSPITAL_BASED_OUTPATIENT_CLINIC_OR_DEPARTMENT_OTHER): Payer: 59 | Admitting: Oncology

## 2021-07-21 ENCOUNTER — Other Ambulatory Visit: Payer: Self-pay

## 2021-07-21 VITALS — BP 149/89 | HR 64 | Temp 97.8°F | Resp 17 | Ht 62.0 in | Wt 147.0 lb

## 2021-07-21 DIAGNOSIS — D242 Benign neoplasm of left breast: Secondary | ICD-10-CM | POA: Insufficient documentation

## 2021-07-21 DIAGNOSIS — Z79899 Other long term (current) drug therapy: Secondary | ICD-10-CM | POA: Diagnosis not present

## 2021-07-21 DIAGNOSIS — M2550 Pain in unspecified joint: Secondary | ICD-10-CM | POA: Diagnosis not present

## 2021-07-21 DIAGNOSIS — E876 Hypokalemia: Secondary | ICD-10-CM | POA: Insufficient documentation

## 2021-07-21 DIAGNOSIS — C9110 Chronic lymphocytic leukemia of B-cell type not having achieved remission: Secondary | ICD-10-CM | POA: Insufficient documentation

## 2021-07-21 LAB — CMP (CANCER CENTER ONLY)
ALT: 10 U/L (ref 0–44)
AST: 21 U/L (ref 15–41)
Albumin: 3.9 g/dL (ref 3.5–5.0)
Alkaline Phosphatase: 62 U/L (ref 38–126)
Anion gap: 10 (ref 5–15)
BUN: 17 mg/dL (ref 8–23)
CO2: 26 mmol/L (ref 22–32)
Calcium: 9.3 mg/dL (ref 8.9–10.3)
Chloride: 105 mmol/L (ref 98–111)
Creatinine: 0.88 mg/dL (ref 0.44–1.00)
GFR, Estimated: >60 mL/min (ref >60)
Glucose, Bld: 97 mg/dL (ref 70–99)
Potassium: 3.3 mmol/L — ABNORMAL LOW (ref 3.5–5.1)
Sodium: 141 mmol/L (ref 135–145)
Total Bilirubin: 1.2 mg/dL (ref 0.3–1.2)
Total Protein: 6.3 g/dL — ABNORMAL LOW (ref 6.5–8.1)

## 2021-07-21 LAB — CBC WITH DIFFERENTIAL (CANCER CENTER ONLY)
Abs Immature Granulocytes: 0.03 10*3/uL (ref 0.00–0.07)
Basophils Absolute: 0 10*3/uL (ref 0.0–0.1)
Basophils Relative: 0 %
Eosinophils Absolute: 0 10*3/uL (ref 0.0–0.5)
Eosinophils Relative: 0 %
HCT: 41.9 % (ref 36.0–46.0)
Hemoglobin: 14.7 g/dL (ref 12.0–15.0)
Immature Granulocytes: 0 %
Lymphocytes Relative: 32 %
Lymphs Abs: 2.8 10*3/uL (ref 0.7–4.0)
MCH: 32.4 pg (ref 26.0–34.0)
MCHC: 35.1 g/dL (ref 30.0–36.0)
MCV: 92.3 fL (ref 80.0–100.0)
Monocytes Absolute: 0.7 10*3/uL (ref 0.1–1.0)
Monocytes Relative: 9 %
Neutro Abs: 5 10*3/uL (ref 1.7–7.7)
Neutrophils Relative %: 59 %
Platelet Count: 238 10*3/uL (ref 150–400)
RBC: 4.54 MIL/uL (ref 3.87–5.11)
RDW: 12.5 % (ref 11.5–15.5)
WBC Count: 8.6 10*3/uL (ref 4.0–10.5)
nRBC: 0 % (ref 0.0–0.2)

## 2021-07-21 NOTE — Progress Notes (Signed)
Hematology and Oncology Follow Up Visit  Alice Pollard [id number] Nov 08, 1950 70 y.o. 07/21/2021 3:19 PM Alice Pollard, MDSouth, Alice Main, MD   Principle Diagnosis: 70 year old woman with CLL diagnosed in 2018.  She presented with lymphadenopathy and lymphocytosis at that time.    Secondary diagnosis: Fibroadenoma of the left breast diagnosed in 2018.  No evidence of relapse after surgical resection.   Current therapy: Ibrutinib 420 mg started in December 2020.   Interim History: Alice Pollard is here for a follow-up visit.  Since her last visit, she reports no major changes in her health.  She continues to tolerate ibrutinib without any major issues.  She denies any nausea, vomiting or abdominal pain.  She does report some occasional arthralgias but overall pain is manageable.  Her performance status quality of life remains excellent.  She denies any easy bruising, palpitation or any bleeding complaints.       Medications: Reviewed without changes. Current Outpatient Medications  Medication Sig Dispense Refill   buPROPion (WELLBUTRIN XL) 300 MG 24 hr tablet Take 300 mg by mouth every morning.     CALCIUM PO Take 600 mg by mouth daily. PATIENT NOT SURE OF DOSAGE     FLUoxetine (PROZAC) 20 MG tablet TAKE 1 TABLET(20 MG) BY MOUTH DAILY 90 tablet 2   ibrutinib (IMBRUVICA) 420 MG TABS TAKE 1 TABLET BY MOUTH ONCE DAILY WITH A FULL GLASS OF  WATER 28 tablet 0   Magnesium Gluconate (MAGNESIUM 27 PO) Take by mouth.     metoprolol succinate (TOPROL XL) 25 MG 24 hr tablet Take 1 tablet (25 mg total) by mouth daily. 90 tablet 3   Multiple Vitamin (MULTIVITAMIN) tablet Take 1 tablet by mouth daily.     potassium chloride (K-DUR) 10 MEQ tablet Take 10 mEq by mouth daily.     rosuvastatin (CRESTOR) 5 MG tablet Take 5 mg by mouth daily.     telmisartan (MICARDIS) 80 MG tablet Take 1 tablet (80 mg total) by mouth daily. 90 tablet 3   No current facility-administered medications for this visit.      Allergies:  Allergies  Allergen Reactions   Codeine Other (See Comments)    Fainted    Latex Itching and Other (See Comments)    sensitivity   Other Other (See Comments)      Physical Exam:         Blood pressure (!) 149/89, pulse 64, temperature 97.8 F (36.6 C), temperature source Oral, resp. rate 17, height 5\' 2"  (1.575 m), weight 147 lb (66.7 kg), last menstrual period 08/05/2007, SpO2 98 %.     ECOG: 0   General appearance: Alert, awake without any distress. Head: Atraumatic without abnormalities Oropharynx: Without any thrush or ulcers. Eyes: No scleral icterus. Lymph nodes: No lymphadenopathy noted in the cervical, supraclavicular, or axillary nodes Heart:regular rate and rhythm, without any murmurs or gallops.   Lung: Clear to auscultation without any rhonchi, wheezes or dullness to percussion. Abdomin: Soft, nontender without any shifting dullness or ascites. Musculoskeletal: No clubbing or cyanosis. Neurological: No motor or sensory deficits. Skin: No rashes or lesions.           Lab Results: Lab Results  Component Value Date   WBC 8.6 07/21/2021   HGB 14.7 07/21/2021   HCT 41.9 07/21/2021   MCV 92.3 07/21/2021   PLT 238 07/21/2021     Chemistry      Component Value Date/Time   NA 141 07/21/2021 1426   NA 142 08/19/2017 1410  K 3.3 (L) 07/21/2021 1426   K 3.8 08/19/2017 1410   CL 105 07/21/2021 1426   CO2 26 07/21/2021 1426   CO2 29 08/19/2017 1410   BUN 17 07/21/2021 1426   BUN 19.9 08/19/2017 1410   CREATININE 0.88 07/21/2021 1426   CREATININE 0.8 08/19/2017 1410      Component Value Date/Time   CALCIUM 9.3 07/21/2021 1426   CALCIUM 9.9 08/19/2017 1410   ALKPHOS 62 07/21/2021 1426   ALKPHOS 89 08/19/2017 1410   AST 21 07/21/2021 1426   AST 20 08/19/2017 1410   ALT 10 07/21/2021 1426   ALT 14 08/19/2017 1410   BILITOT 1.2 07/21/2021 1426   BILITOT 0.60 08/19/2017 1410       Impression and Plan:  70 year old  woman with:  1.  CLL presented with lymphocytosis and adenopathy in 2018.   Laboratory data from today were reviewed and treatment options were discussed at this time.  She has tolerated ibrutinib very well with excellent response on her hematological parameters with normalization of her lymphocytosis.  Risks and benefits of continuing this treatment long-term were reviewed.  Potential complications include cardiac arrhythmia and bleeding issues.  2.  Autoimmune considerations: I continue to educate her about potential autoimmune thrombocytopenia and hemolytic anemia.  No evidence of that noted at this time.  3.  Hypokalemia: We have discussed strategies to improve her potassium including potassium rich diet without supplements.   4. Follow-up: In 3 months for repeat visit.  30 minutes were spent on this encounter.  The time was dedicated to reviewing laboratory data, disease status update, treatment choices and future plan of care discussion.  Zola Button, MD 10/18/20223:19 PM

## 2021-08-14 ENCOUNTER — Other Ambulatory Visit: Payer: Self-pay | Admitting: Obstetrics & Gynecology

## 2021-08-14 ENCOUNTER — Other Ambulatory Visit: Payer: Self-pay | Admitting: Endocrinology

## 2021-08-14 ENCOUNTER — Other Ambulatory Visit: Payer: Self-pay

## 2021-08-14 DIAGNOSIS — Z1231 Encounter for screening mammogram for malignant neoplasm of breast: Secondary | ICD-10-CM

## 2021-08-26 ENCOUNTER — Other Ambulatory Visit: Payer: Self-pay | Admitting: Cardiology

## 2021-09-02 ENCOUNTER — Telehealth: Payer: Self-pay | Admitting: Cardiology

## 2021-09-02 ENCOUNTER — Other Ambulatory Visit: Payer: Self-pay

## 2021-09-02 MED ORDER — TELMISARTAN 80 MG PO TABS: 80.0000 mg | ORAL_TABLET | Freq: Every day | ORAL | 0 refills | Status: DC

## 2021-09-02 NOTE — Telephone Encounter (Signed)
Pt's medication was sent to pt's pharmacy as requested. Confirmation received.  °

## 2021-09-02 NOTE — Telephone Encounter (Signed)
*  STAT* If patient is at the pharmacy, call can be transferred to refill team.   1. Which medications need to be refilled? (please list name of each medication and dose if known) telmisartan (MICARDIS) 80 MG tablet  2. Which pharmacy/location (including street and city if local pharmacy) is medication to be sent to?[place], [place] - [address] AT [place]  3. Do they need a 30 day or 90 day supply? 90 day   Patient is out of the medication

## 2021-09-17 ENCOUNTER — Ambulatory Visit
Admission: RE | Admit: 2021-09-17 | Discharge: 2021-09-17 | Disposition: A | Discharge status: Home / Self Care | Payer: 59 | Source: Ambulatory Visit | Attending: Obstetrics & Gynecology | Admitting: Obstetrics & Gynecology

## 2021-09-17 DIAGNOSIS — Z1231 Encounter for screening mammogram for malignant neoplasm of breast: Secondary | ICD-10-CM

## 2021-09-21 ENCOUNTER — Encounter: Payer: Self-pay | Admitting: Oncology

## 2021-10-09 ENCOUNTER — Other Ambulatory Visit: Payer: Self-pay | Admitting: *Deleted

## 2021-10-09 DIAGNOSIS — C9110 Chronic lymphocytic leukemia of B-cell type not having achieved remission: Secondary | ICD-10-CM

## 2021-10-09 MED ORDER — IMBRUVICA 420 MG PO TABS: ORAL_TABLET | ORAL | 0 refills | Status: DC

## 2021-10-20 ENCOUNTER — Inpatient Hospital Stay: Payer: 59 | Attending: Oncology

## 2021-10-20 ENCOUNTER — Other Ambulatory Visit: Payer: Self-pay

## 2021-10-20 ENCOUNTER — Inpatient Hospital Stay (HOSPITAL_BASED_OUTPATIENT_CLINIC_OR_DEPARTMENT_OTHER): Payer: 59 | Admitting: Oncology

## 2021-10-20 VITALS — BP 162/89 | HR 66 | Temp 98.0°F | Resp 18 | Ht 62.0 in | Wt 147.2 lb

## 2021-10-20 DIAGNOSIS — C9110 Chronic lymphocytic leukemia of B-cell type not having achieved remission: Secondary | ICD-10-CM | POA: Insufficient documentation

## 2021-10-20 DIAGNOSIS — M25559 Pain in unspecified hip: Secondary | ICD-10-CM | POA: Diagnosis not present

## 2021-10-20 DIAGNOSIS — E876 Hypokalemia: Secondary | ICD-10-CM | POA: Insufficient documentation

## 2021-10-20 DIAGNOSIS — Z79899 Other long term (current) drug therapy: Secondary | ICD-10-CM | POA: Insufficient documentation

## 2021-10-20 DIAGNOSIS — D242 Benign neoplasm of left breast: Secondary | ICD-10-CM | POA: Diagnosis not present

## 2021-10-20 LAB — CBC WITH DIFFERENTIAL (CANCER CENTER ONLY)
Abs Immature Granulocytes: 0.02 10*3/uL (ref 0.00–0.07)
Basophils Absolute: 0.1 10*3/uL (ref 0.0–0.1)
Basophils Relative: 1 %
Eosinophils Absolute: 0 10*3/uL (ref 0.0–0.5)
Eosinophils Relative: 0 %
HCT: 41.3 % (ref 36.0–46.0)
Hemoglobin: 14.1 g/dL (ref 12.0–15.0)
Immature Granulocytes: 0 %
Lymphocytes Relative: 38 %
Lymphs Abs: 3.4 10*3/uL (ref 0.7–4.0)
MCH: 32.1 pg (ref 26.0–34.0)
MCHC: 34.1 g/dL (ref 30.0–36.0)
MCV: 94.1 fL (ref 80.0–100.0)
Monocytes Absolute: 0.7 10*3/uL (ref 0.1–1.0)
Monocytes Relative: 8 %
Neutro Abs: 4.8 10*3/uL (ref 1.7–7.7)
Neutrophils Relative %: 53 %
Platelet Count: 250 10*3/uL (ref 150–400)
RBC: 4.39 MIL/uL (ref 3.87–5.11)
RDW: 13.5 % (ref 11.5–15.5)
WBC Count: 8.9 10*3/uL (ref 4.0–10.5)
nRBC: 0 % (ref 0.0–0.2)

## 2021-10-20 LAB — CMP (CANCER CENTER ONLY)
ALT: 10 U/L (ref 0–44)
AST: 19 U/L (ref 15–41)
Albumin: 4.2 g/dL (ref 3.5–5.0)
Alkaline Phosphatase: 62 U/L (ref 38–126)
Anion gap: 7 (ref 5–15)
BUN: 18 mg/dL (ref 8–23)
CO2: 28 mmol/L (ref 22–32)
Calcium: 9.3 mg/dL (ref 8.9–10.3)
Chloride: 105 mmol/L (ref 98–111)
Creatinine: 0.87 mg/dL (ref 0.44–1.00)
GFR, Estimated: >60 mL/min (ref >60)
Glucose, Bld: 85 mg/dL (ref 70–99)
Potassium: 3.8 mmol/L (ref 3.5–5.1)
Sodium: 140 mmol/L (ref 135–145)
Total Bilirubin: 0.9 mg/dL (ref 0.3–1.2)
Total Protein: 6.5 g/dL (ref 6.5–8.1)

## 2021-10-20 NOTE — Progress Notes (Signed)
Hematology and Oncology Follow Up Visit  Alice Pollard [id number] September 02, 1951 71 y.o. 10/20/2021 3:20 PM [name], MDSouth, Alice Main, MD   Principle Diagnosis: 71 year old woman with CLL presented with lymphadenopathy and lymphocytosis in 2018.  Secondary diagnosis: Fibroadenoma of the left breast diagnosed in 2018.  No evidence of relapse after surgical resection.   Current therapy: Ibrutinib 420 mg started in December 2020.   Interim History: Alice Pollard returns today for a follow-up visit.  Since the last visit, she reports no major changes in her health.  She denies any recent hospitalizations or illnesses.  She does report some occasional muscle aches and hip pain which is chronic in nature related to arthritis.  She denies any nausea, fatigue or bleeding issues.  She denies any recent hospitalizations or illnesses.      Medications: Updated on review. Current Outpatient Medications  Medication Sig Dispense Refill   buPROPion (WELLBUTRIN XL) 300 MG 24 hr tablet Take 300 mg by mouth every morning.     CALCIUM PO Take 600 mg by mouth daily. PATIENT NOT SURE OF DOSAGE     FLUoxetine (PROZAC) 20 MG tablet TAKE 1 TABLET(20 MG) BY MOUTH DAILY 90 tablet 2   ibrutinib (IMBRUVICA) 420 MG TABS TAKE 1 TABLET BY MOUTH ONCE DAILY WITH A FULL GLASS OF  WATER 28 tablet 0   Magnesium Gluconate (MAGNESIUM 27 PO) Take by mouth.     metoprolol succinate (TOPROL-XL) 25 MG 24 hr tablet TAKE 1 TABLET(25 MG) BY MOUTH DAILY 90 tablet 3   Multiple Vitamin (MULTIVITAMIN) tablet Take 1 tablet by mouth daily.     potassium chloride (K-DUR) 10 MEQ tablet Take 10 mEq by mouth daily.     rosuvastatin (CRESTOR) 5 MG tablet Take 5 mg by mouth daily.     telmisartan (MICARDIS) 80 MG tablet Take 1 tablet (80 mg total) by mouth daily. Please keep upcoming appt in February 2023 with Dr. Martinique before anymore refills. Thank you 90 tablet 0   No current facility-administered medications for this visit.      Allergies:  Allergies  Allergen Reactions   Codeine Other (See Comments)    Fainted    Latex Itching and Other (See Comments)    sensitivity   Other Other (See Comments)      Physical Exam:  Blood pressure (!) 162/89, pulse 66, temperature 98 F (36.7 C), temperature source Temporal, resp. rate 18, height 5\' 2"  (1.575 m), weight 147 lb 3.2 oz (66.8 kg), last menstrual period 08/05/2007, SpO2 100 %.     ECOG: 0    General appearance: Comfortable appearing without any discomfort Head: Normocephalic without any trauma Oropharynx: Mucous membranes are moist and pink without any thrush or ulcers. Eyes: Pupils are equal and round reactive to light. Lymph nodes: No cervical, supraclavicular, inguinal or axillary lymphadenopathy.   Heart:regular rate and rhythm.  S1 and S2 without leg edema. Lung: Clear without any rhonchi or wheezes.  No dullness to percussion. Abdomin: Soft, nontender, nondistended with good bowel sounds.  No hepatosplenomegaly. Musculoskeletal: No joint deformity or effusion.  Full range of motion noted. Neurological: No deficits noted on motor, sensory and deep tendon reflex exam. Skin: No petechial rash or dryness.  Appeared moist.             Lab Results: Lab Results  Component Value Date   WBC 8.9 10/20/2021   HGB 14.1 10/20/2021   HCT 41.3 10/20/2021   MCV 94.1 10/20/2021   PLT 250 10/20/2021  Chemistry      Component Value Date/Time   NA 141 07/21/2021 1426   NA 142 08/19/2017 1410   K 3.3 (L) 07/21/2021 1426   K 3.8 08/19/2017 1410   CL 105 07/21/2021 1426   CO2 26 07/21/2021 1426   CO2 29 08/19/2017 1410   BUN 17 07/21/2021 1426   BUN 19.9 08/19/2017 1410   CREATININE 0.88 07/21/2021 1426   CREATININE 0.8 08/19/2017 1410      Component Value Date/Time   CALCIUM 9.3 07/21/2021 1426   CALCIUM 9.9 08/19/2017 1410   ALKPHOS 62 07/21/2021 1426   ALKPHOS 89 08/19/2017 1410   AST 21 07/21/2021 1426   AST 20 08/19/2017  1410   ALT 10 07/21/2021 1426   ALT 14 08/19/2017 1410   BILITOT 1.2 07/21/2021 1426   BILITOT 0.60 08/19/2017 1410       Impression and Plan:  71 year old woman with:  1.  CLL diagnosed in 2018 after presenting with lymphocytosis and adenopathy.  Laboratory data from today reviewed and continues to show complete hematological response.  Risks and benefits of continuing this treatment were reviewed at this time.  Complications include cardiac issues, palpitation and bleeding were reiterated.  She does report some arthralgias which could be related to this medication but overall tolerable.  After discussion today, she opted to continue with the same dose and schedule.  2.  Autoimmune considerations: No evidence of autoimmune hemolysis or thrombocytopenia.  We will continue to educate her about this potential complications.  3.  Hypokalemia: Related to ibrutinib.  We will continue to monitor and replace as needed.   4. Follow-up: She will return in 3 months for repeat evaluation.  30 minutes were dedicated to this visit.  The time was spent on reviewing laboratory data, disease status update, treatment choices and future plan of care discussion.  Zola Button, MD 1/17/20233:20 PM

## 2021-10-29 ENCOUNTER — Other Ambulatory Visit: Payer: Self-pay

## 2021-10-29 ENCOUNTER — Ambulatory Visit (INDEPENDENT_AMBULATORY_CARE_PROVIDER_SITE_OTHER): Payer: 59 | Admitting: Obstetrics & Gynecology

## 2021-10-29 ENCOUNTER — Other Ambulatory Visit (HOSPITAL_COMMUNITY)
Admission: RE | Admit: 2021-10-29 | Discharge: 2021-10-29 | Disposition: A | Discharge status: Home / Self Care | Payer: 59 | Source: Ambulatory Visit | Attending: Obstetrics & Gynecology | Admitting: Obstetrics & Gynecology

## 2021-10-29 ENCOUNTER — Encounter (HOSPITAL_BASED_OUTPATIENT_CLINIC_OR_DEPARTMENT_OTHER): Payer: Self-pay | Admitting: Obstetrics & Gynecology

## 2021-10-29 VITALS — BP 165/93 | HR 65 | Ht 62.0 in | Wt 146.4 lb

## 2021-10-29 DIAGNOSIS — Z860100 Personal history of colon polyps, unspecified: Secondary | ICD-10-CM | POA: Insufficient documentation

## 2021-10-29 DIAGNOSIS — Z01419 Encounter for gynecological examination (general) (routine) without abnormal findings: Secondary | ICD-10-CM | POA: Diagnosis not present

## 2021-10-29 DIAGNOSIS — E2839 Other primary ovarian failure: Secondary | ICD-10-CM

## 2021-10-29 DIAGNOSIS — Z124 Encounter for screening for malignant neoplasm of cervix: Secondary | ICD-10-CM | POA: Diagnosis present

## 2021-10-29 DIAGNOSIS — C9110 Chronic lymphocytic leukemia of B-cell type not having achieved remission: Secondary | ICD-10-CM

## 2021-10-29 DIAGNOSIS — I10 Essential (primary) hypertension: Secondary | ICD-10-CM

## 2021-10-29 NOTE — Progress Notes (Signed)
71 y.o. G3P3 Married White or Caucasian female here for breast and pelvic exam.  Doing well.  H/o CLL and is on treatment for this.  She has normal white count now.  Having some muslces and bone pain.  Stopped her statin because of muscle pains as well.    Has not had BMD.  We have discussed several times over the past year.  She is aware I will place order and ask for her to be called for scheduling.    Family is well.  Denies vaginal bleeding.  Patient's last menstrual period was 08/05/2007.          Sexually active: No.  H/O STD:  no  Health Maintenance: PCP:  Dr. Forde Dandy.  Last wellness appt was 01/2021.  Did blood work at that appt:  yes Vaccines are up to date:  reviewed and updated Colonoscopy:  04/25/2017 MMG:  09/17/2021 Negative BMD:  04/19/2013 Last pap smear:  10/28/2016 Negative.   H/o abnormal pap smear: no    reports that she has never smoked. She has never used smokeless tobacco. She reports that she does not currently use alcohol. She reports that she does not use drugs.  Past Medical History:  Diagnosis Date   ADD (attention deficit disorder)    CLL (chronic lymphocytic leukemia) (HCC)    Dyslipidemia    Endometriosis    Hematuria    h/o asymptomatic   HTN (hypertension)    PONV (postoperative nausea and vomiting)     Past Surgical History:  Procedure Laterality Date   LAPAROSCOPY     endometriosis   RADIOACTIVE SEED GUIDED EXCISIONAL BREAST BIOPSY Left 09/13/2017   Procedure: RADIOACTIVE SEED GUIDED EXCISIONAL BREAST BIOPSY;  Surgeon: Rolm Bookbinder, MD;  Location: [facility];  Service: General;  Laterality: Left;    Current Outpatient Medications  Medication Sig Dispense Refill   CALCIUM PO Take 600 mg by mouth daily. PATIENT NOT SURE OF DOSAGE     FLUoxetine (PROZAC) 20 MG tablet TAKE 1 TABLET(20 MG) BY MOUTH DAILY 90 tablet 2   ibrutinib (IMBRUVICA) 420 MG TABS TAKE 1 TABLET BY MOUTH ONCE DAILY WITH A FULL GLASS OF  WATER 28 tablet 0    Magnesium Gluconate (MAGNESIUM 27 PO) Take by mouth.     metoprolol succinate (TOPROL-XL) 25 MG 24 hr tablet TAKE 1 TABLET(25 MG) BY MOUTH DAILY 90 tablet 3   Multiple Vitamin (MULTIVITAMIN) tablet Take 1 tablet by mouth daily.     potassium chloride (K-DUR) 10 MEQ tablet Take 10 mEq by mouth daily.     telmisartan (MICARDIS) 80 MG tablet Take 1 tablet (80 mg total) by mouth daily. Please keep upcoming appt in February 2023 with Dr. Martinique before anymore refills. Thank you 90 tablet 0   No current facility-administered medications for this visit.    Family History  Problem Relation Age of Onset   Heart failure Mother    Diabetes Father    Other Brother        bilateral bladder   Colon cancer Neg Hx     Review of Systems  All other systems reviewed and are negative.  Exam:   BP (!) 165/93 (BP Location: Left Arm, Patient Position: Sitting, Cuff Size: Normal)    Pulse 65    Ht 5\' 2"  (1.575 m) Comment: reported   Wt 146 lb 6.4 oz (66.4 kg)    LMP 08/05/2007    BMI 26.78 kg/m   Height: 5\' 2"  (157.5 cm) (reported)  General appearance: alert, cooperative and appears stated age Breasts: normal appearance, no masses or tenderness Abdomen: soft, non-tender; bowel sounds normal; no masses,  no organomegaly Lymph nodes: Cervical, supraclavicular, and axillary nodes normal.  No abnormal inguinal nodes palpated Neurologic: Grossly normal  Pelvic: External genitalia:  no lesions              Urethra:  normal appearing urethra with no masses, tenderness or lesions              Bartholins and Skenes: normal                 Vagina: normal appearing vagina with atrophic changes and no discharge, no lesions              Cervix: no lesions              Pap taken: Yes.   Bimanual Exam:  Uterus:  normal size, contour, position, consistency, mobility, non-tender              Adnexa: normal adnexa and no mass, fullness, tenderness               Rectovaginal: Confirms               Anus:  normal  sphincter tone, no lesions  Chaperone, Octaviano Batty, CMA, was present for exam.  Assessment/Plan: 1. Encntr for gyn exam (general) (routine) w/o abn findings - pap smear obtained.  Plan to stop at age 65. - MMG 09/2021 - Colonoscopy 04/2017 - BMD order placed - Does lab work with PCP, Dr. Forde Dandy - vaccines reviewed and updated   2. Hypoestrogenism - DG BONE DENSITY (DXA); Future  3. Cervical cancer screening - Cytology - PAP( [facility])  4. CLL (chronic lymphocytic leukemia) ([facility]) - followed by Dr. Alen Blew  5. Primary hypertension - pt aware this is elevated today but she is often nervous with exams.  Advised to start checking outside of work.  If values are similar at home, needs to follow up with Dr. Forde Dandy for additional medication management  6. History of colonic polyps

## 2021-10-30 DIAGNOSIS — C9110 Chronic lymphocytic leukemia of B-cell type not having achieved remission: Secondary | ICD-10-CM | POA: Insufficient documentation

## 2021-11-04 LAB — CYTOLOGY - PAP
Comment: NEGATIVE
Diagnosis: UNDETERMINED — AB
High risk HPV: NEGATIVE

## 2021-11-05 ENCOUNTER — Other Ambulatory Visit: Payer: Self-pay

## 2021-11-05 ENCOUNTER — Ambulatory Visit (HOSPITAL_BASED_OUTPATIENT_CLINIC_OR_DEPARTMENT_OTHER)
Admission: RE | Admit: 2021-11-05 | Discharge: 2021-11-05 | Disposition: A | Discharge status: Home / Self Care | Payer: 59 | Source: Ambulatory Visit | Attending: Obstetrics & Gynecology | Admitting: Obstetrics & Gynecology

## 2021-11-05 DIAGNOSIS — E2839 Other primary ovarian failure: Secondary | ICD-10-CM | POA: Insufficient documentation

## 2021-11-10 ENCOUNTER — Other Ambulatory Visit: Payer: Self-pay | Admitting: *Deleted

## 2021-11-10 DIAGNOSIS — C9110 Chronic lymphocytic leukemia of B-cell type not having achieved remission: Secondary | ICD-10-CM

## 2021-11-10 MED ORDER — IMBRUVICA 420 MG PO TABS: ORAL_TABLET | ORAL | 0 refills | Status: DC

## 2021-11-20 NOTE — Progress Notes (Signed)
Cardiology Office Note:    Date:  11/25/2021   ID:  Alice Pollard, DOB 09-02-1951, MRN [id number]  PCP:  [name], MD  Cardiologist:  Dr. Leelah Hanna Martinique   Electrophysiologist:  n/a  Referring MD: [name], MD   Chief Complaint  Patient presents with   Palpitations    History of Present Illness:    Alice Pollard is a 71 y.o. female with a hx of HTN, HL, PVCs.  Seen in 2017.  Her ECG demonstrated asymptomatic PVCs in a bigeminal pattern. Echocardiogram demonstrated normal LV function. Patient was placed on Metoprolol tartrate 12.5 bid.    Seen by Richardson Dopp PAC in July 2017.  She never started the Metoprolol. This was resumed and a follow up Holter done in August. This showed PACs and PVCs with low arrhythmia burden with only 452 PVCs in 24 hours.   She has been diagnosed with stage 1 CLL and is followed by oncology. On Imbruvica.  On her last visit in 2021 we stopped HCTZ due to low BP. Since then BP has been consistently high. No palpitations. CLL is well controlled. No edema.  Past Medical History:  Diagnosis Date   ADD (attention deficit disorder)    CLL (chronic lymphocytic leukemia) (HCC)    Dyslipidemia    Endometriosis    Hematuria    h/o asymptomatic   HTN (hypertension)    PONV (postoperative nausea and vomiting)     Past Surgical History:  Procedure Laterality Date   LAPAROSCOPY     endometriosis   RADIOACTIVE SEED GUIDED EXCISIONAL BREAST BIOPSY Left 09/13/2017   Procedure: RADIOACTIVE SEED GUIDED EXCISIONAL BREAST BIOPSY;  Surgeon: Rolm Bookbinder, MD;  Location: [facility];  Service: General;  Laterality: Left;    Current Medications: Outpatient Medications Prior to Visit  Medication Sig Dispense Refill   CALCIUM PO Take 600 mg by mouth daily. PATIENT NOT SURE OF DOSAGE     FLUoxetine (PROZAC) 20 MG tablet TAKE 1 TABLET(20 MG) BY MOUTH DAILY 90 tablet 2   ibrutinib (IMBRUVICA) 420 MG TABS TAKE 1 TABLET BY MOUTH ONCE  DAILY WITH A FULL GLASS OF  WATER 28 tablet 0   Magnesium Gluconate (MAGNESIUM 27 PO) Take by mouth.     metoprolol succinate (TOPROL-XL) 25 MG 24 hr tablet TAKE 1 TABLET(25 MG) BY MOUTH DAILY 90 tablet 3   Multiple Vitamin (MULTIVITAMIN) tablet Take 1 tablet by mouth daily.     potassium chloride (K-DUR) 10 MEQ tablet Take 10 mEq by mouth daily.     telmisartan (MICARDIS) 80 MG tablet Take 1 tablet (80 mg total) by mouth daily. Please keep upcoming appt in February 2023 with Dr. Martinique before anymore refills. Thank you 90 tablet 0   No facility-administered medications prior to visit.      Allergies:   Codeine, Latex, and Other   Social History   Socioeconomic History   Marital status: Married    Spouse name: Not on file   Number of children: 3   Years of education: Not on file   Highest education level: Not on file  Occupational History   Occupation: Journalist, newspaper  Tobacco Use   Smoking status: Never   Smokeless tobacco: Never  Vaping Use   Vaping Use: Never used  Substance and Sexual Activity   Alcohol use: Not Currently   Drug use: No   Sexual activity: Not Currently    Partners: Male    Birth control/protection: Post-menopausal  Comment: vasectomy  Other Topics Concern   Not on file  Social History Narrative   Not on file   Social Determinants of Health   Financial Resource Strain: Not on file  Food Insecurity: Not on file  Transportation Needs: Not on file  Physical Activity: Not on file  Stress: Not on file  Social Connections: Not on file     Family History:  The patient's family history includes Diabetes in her father; Heart failure in her mother; Other in her brother.   ROS:   Please see the history of present illness.    ROS All other systems reviewed and are negative.   Physical Exam:    VS:  BP 130/88 (BP Location: Left Arm, Patient Position: Sitting, Cuff Size: Normal)    Pulse 66    Resp 20    Ht 5\' 3"  (1.6 m)    Wt 146 lb 12.8 oz (66.6  kg)    LMP 08/05/2007    SpO2 99%    BMI 26.00 kg/m     Wt Readings from Last 3 Encounters:  11/25/21 146 lb 12.8 oz (66.6 kg)  10/29/21 146 lb 6.4 oz (66.4 kg)  10/20/21 147 lb 3.2 oz (66.8 kg)    GENERAL:  Well appearing WF in NAD HEENT:  PERRL, EOMI, sclera are clear. Oropharynx is clear. NECK:  No jugular venous distention, carotid upstroke brisk and symmetric, no bruits, no thyromegaly or adenopathy LUNGS:  Clear to auscultation bilaterally CHEST:  Unremarkable HEART:  RRR  PMI not displaced or sustained,S1 and S2 within normal limits, no S3, no S4: no clicks, no rubs, no murmurs ABD:  Soft, nontender. BS +, no masses or bruits. No hepatomegaly, no splenomegaly EXT:  2 + pulses throughout, no edema, no cyanosis no clubbing SKIN:  Warm and dry.  No rashes NEURO:  Alert and oriented x 3. Cranial nerves II through XII intact. PSYCH:  Cognitively intact     Studies/Labs Reviewed:   EKG:  EKG is  ordered today.  NSR with rate 66.  Normal.  I have personally reviewed and interpreted this study.   Recent Labs: 10/20/2021: ALT 10; BUN 18; Creatinine 0.87; Hemoglobin 14.1; Platelet Count 250; Potassium 3.8; Sodium 140   Recent Lipid Panel    Component Value Date/Time   CHOL 225 (H) 04/16/2016 1002   TRIG 257 (H) 04/16/2016 1002   HDL 56 04/16/2016 1002   CHOLHDL 4.0 04/16/2016 1002   VLDL 51 (H) 04/16/2016 1002   LDLCALC 118 04/16/2016 1002   LDLDIRECT 121.6 01/15/2013 0915   Labs dated 01/16/18: cholesterol 305, triglycerides 461, HDL 56, LDL 157.  Dated 04/01/21: 248, triglycerides 209, HDL 87, LDL 119. Non HDL 161.    Additional studies/ records that were reviewed today include:   Echo 04/16/16 Vigorous LVEF, EF 65-70%, normal wall motion, grade 1 diastolic dysfunction, trivial MR  ETT-Echo 3/08 Normal   Holter monitor 05/25/16 showed occ. PVCs and rare PACs. Arrhythmia burden was quite low with total PVCs of only 452.  ASSESSMENT:    1. PVC (premature ventricular  contraction)   2. Essential hypertension    PLAN:    In order of problems listed above:  1. PVCs - by prior Holter monitor she had low burden on metoprolol.  She is asymptomatic. EF is normal on Echo 2017.  Continue current therapy.   2. HTN - BP is persistently high.   Will continue Telmisartan 80 mg and Toprol XL 25 mg daily. Resume HCTZ  at 12.5 mg daily.  Follow up in one year.    Medication Adjustments/Labs and Tests Ordered: Current medicines are reviewed at length with the patient today.  Concerns regarding medicines are outlined above.  Medication changes, Labs and Tests ordered today are outlined in the Patient Instructions noted below. Patient Instructions  Resume HCTZ at 12.5 mg daily for BP      Signed, Ronique Simerly Martinique, MD  11/25/2021 9:14 AM    [facility] [place], [place], [place]  [address] Phone: [phone]; Fax: [phone]

## 2021-11-22 DIAGNOSIS — M47816 Spondylosis without myelopathy or radiculopathy, lumbar region: Secondary | ICD-10-CM | POA: Insufficient documentation

## 2021-11-22 DIAGNOSIS — M1612 Unilateral primary osteoarthritis, left hip: Secondary | ICD-10-CM | POA: Insufficient documentation

## 2021-11-25 ENCOUNTER — Encounter: Payer: Self-pay | Admitting: Cardiology

## 2021-11-25 ENCOUNTER — Other Ambulatory Visit: Payer: Self-pay

## 2021-11-25 ENCOUNTER — Ambulatory Visit (INDEPENDENT_AMBULATORY_CARE_PROVIDER_SITE_OTHER): Payer: 59 | Admitting: Cardiology

## 2021-11-25 VITALS — BP 130/88 | HR 66 | Resp 20 | Ht 63.0 in | Wt 146.8 lb

## 2021-11-25 DIAGNOSIS — I493 Ventricular premature depolarization: Secondary | ICD-10-CM

## 2021-11-25 DIAGNOSIS — I10 Essential (primary) hypertension: Secondary | ICD-10-CM | POA: Diagnosis not present

## 2021-11-25 MED ORDER — HYDROCHLOROTHIAZIDE 12.5 MG PO CAPS: 12.5000 mg | ORAL_CAPSULE | Freq: Every day | ORAL | 3 refills | Status: DC

## 2021-11-25 MED ORDER — TELMISARTAN 80 MG PO TABS
80.0000 mg | ORAL_TABLET | Freq: Every day | ORAL | 3 refills | Status: DC
Start: 2021-11-25 — End: 2022-02-18

## 2021-11-25 NOTE — Patient Instructions (Signed)
Resume HCTZ at 12.5 mg daily for BP

## 2021-11-30 ENCOUNTER — Other Ambulatory Visit (HOSPITAL_COMMUNITY): Payer: Self-pay

## 2021-12-04 ENCOUNTER — Other Ambulatory Visit: Payer: Self-pay | Admitting: *Deleted

## 2021-12-04 DIAGNOSIS — C9110 Chronic lymphocytic leukemia of B-cell type not having achieved remission: Secondary | ICD-10-CM

## 2021-12-04 MED ORDER — IBRUTINIB 420 MG PO TABS: ORAL_TABLET | ORAL | 0 refills | Status: DC

## 2022-01-19 ENCOUNTER — Inpatient Hospital Stay (HOSPITAL_BASED_OUTPATIENT_CLINIC_OR_DEPARTMENT_OTHER): Payer: 59 | Admitting: Oncology

## 2022-01-19 ENCOUNTER — Inpatient Hospital Stay: Payer: 59 | Attending: Oncology

## 2022-01-19 ENCOUNTER — Other Ambulatory Visit: Payer: Self-pay

## 2022-01-19 VITALS — BP 132/81 | HR 72 | Temp 98.2°F | Resp 17 | Ht 63.0 in | Wt 147.2 lb

## 2022-01-19 DIAGNOSIS — E876 Hypokalemia: Secondary | ICD-10-CM | POA: Diagnosis not present

## 2022-01-19 DIAGNOSIS — Z79899 Other long term (current) drug therapy: Secondary | ICD-10-CM | POA: Insufficient documentation

## 2022-01-19 DIAGNOSIS — M25551 Pain in right hip: Secondary | ICD-10-CM | POA: Diagnosis not present

## 2022-01-19 DIAGNOSIS — C9110 Chronic lymphocytic leukemia of B-cell type not having achieved remission: Secondary | ICD-10-CM

## 2022-01-19 DIAGNOSIS — D242 Benign neoplasm of left breast: Secondary | ICD-10-CM | POA: Insufficient documentation

## 2022-01-19 LAB — CMP (CANCER CENTER ONLY)
ALT: 10 U/L (ref 0–44)
AST: 18 U/L (ref 15–41)
Albumin: 4.3 g/dL (ref 3.5–5.0)
Alkaline Phosphatase: 55 U/L (ref 38–126)
Anion gap: 7 (ref 5–15)
BUN: 18 mg/dL (ref 8–23)
CO2: 30 mmol/L (ref 22–32)
Calcium: 9.5 mg/dL (ref 8.9–10.3)
Chloride: 104 mmol/L (ref 98–111)
Creatinine: 0.92 mg/dL (ref 0.44–1.00)
GFR, Estimated: >60 mL/min (ref >60)
Glucose, Bld: 66 mg/dL — ABNORMAL LOW (ref 70–99)
Potassium: 3.4 mmol/L — ABNORMAL LOW (ref 3.5–5.1)
Sodium: 141 mmol/L (ref 135–145)
Total Bilirubin: 0.9 mg/dL (ref 0.3–1.2)
Total Protein: 6.7 g/dL (ref 6.5–8.1)

## 2022-01-19 LAB — CBC WITH DIFFERENTIAL (CANCER CENTER ONLY)
Abs Immature Granulocytes: 0.03 10*3/uL (ref 0.00–0.07)
Basophils Absolute: 0 10*3/uL (ref 0.0–0.1)
Basophils Relative: 0 %
Eosinophils Absolute: 0.1 10*3/uL (ref 0.0–0.5)
Eosinophils Relative: 1 %
HCT: 43.4 % (ref 36.0–46.0)
Hemoglobin: 14.8 g/dL (ref 12.0–15.0)
Immature Granulocytes: 0 %
Lymphocytes Relative: 35 %
Lymphs Abs: 3.5 10*3/uL (ref 0.7–4.0)
MCH: 31.9 pg (ref 26.0–34.0)
MCHC: 34.1 g/dL (ref 30.0–36.0)
MCV: 93.5 fL (ref 80.0–100.0)
Monocytes Absolute: 0.8 10*3/uL (ref 0.1–1.0)
Monocytes Relative: 8 %
Neutro Abs: 5.7 10*3/uL (ref 1.7–7.7)
Neutrophils Relative %: 56 %
Platelet Count: 246 10*3/uL (ref 150–400)
RBC: 4.64 MIL/uL (ref 3.87–5.11)
RDW: 12.5 % (ref 11.5–15.5)
WBC Count: 10 10*3/uL (ref 4.0–10.5)
nRBC: 0 % (ref 0.0–0.2)

## 2022-01-19 NOTE — Progress Notes (Signed)
Hematology and Oncology Follow Up Visit ? ?Alice Pollard ?[id number] ?1951/07/07 71 y.o. ?01/19/2022 1:09 PM ?Alice Pollard, MDSouth, Alice Main, MD  ? ?Principle Diagnosis: 71 year old woman with CLL diagnosed in 2018 with lymphadenopathy and lymphocytosis.  18. ? ?Secondary diagnosis: Fibroadenoma of the left breast diagnosed in 2018.  No evidence of relapse after surgical resection. ? ? ?Current therapy: Ibrutinib 420 mg started in December 2020.  ? ?Interim History: Alice Pollard is here for return evaluation.  Since last visit, she reports no major changes in her health.  She continues to be bothered by right-sided hip pain related to osteoarthritis.  She has been reluctant to take nonsteroidal anti-inflammatories due to risk of bleeding.  She continues to be reasonably active otherwise.  She likes to play golf but not able to because of her hip pain.  She denies any complications related to ibrutinib.  She denies any excessive bleeding or bruising.  She denies any palpitation. ? ? ? ? ?Medications: Reviewed without changes. ?Current Outpatient Medications  ?Medication Sig Dispense Refill  ? CALCIUM PO Take 600 mg by mouth daily. PATIENT NOT SURE OF DOSAGE    ? FLUoxetine (PROZAC) 20 MG tablet TAKE 1 TABLET(20 MG) BY MOUTH DAILY 90 tablet 2  ? hydrochlorothiazide (MICROZIDE) 12.5 MG capsule Take 1 capsule (12.5 mg total) by mouth daily. 90 capsule 3  ? ibrutinib (IMBRUVICA) 420 MG tablet TAKE 1 TABLET BY MOUTH ONCE DAILY WITH A FULL GLASS OF  WATER 28 tablet 0  ? Magnesium Gluconate (MAGNESIUM 27 PO) Take by mouth.    ? metoprolol succinate (TOPROL-XL) 25 MG 24 hr tablet TAKE 1 TABLET(25 MG) BY MOUTH DAILY 90 tablet 3  ? Multiple Vitamin (MULTIVITAMIN) tablet Take 1 tablet by mouth daily.    ? potassium chloride (K-DUR) 10 MEQ tablet Take 10 mEq by mouth daily.    ? telmisartan (MICARDIS) 80 MG tablet Take 1 tablet (80 mg total) by mouth daily. Please keep upcoming appt in February 2023 with Dr. Martinique before anymore  refills. Thank you 90 tablet 3  ? ?No current facility-administered medications for this visit.  ? ? ? ?Allergies:  ?Allergies  ?Allergen Reactions  ? Codeine Other (See Comments)  ?  Fainted   ? Latex Itching and Other (See Comments)  ?  sensitivity  ? Other Other (See Comments)  ? ? ? ? ?Physical Exam: ? ? ?Blood pressure 132/81, pulse 72, temperature 98.2 ?F (36.8 ?C), temperature source Temporal, resp. rate 17, height '5\' 3"'$  (1.6 m), weight 147 lb 3.2 oz (66.8 kg), last menstrual period 08/05/2007, SpO2 100 %. ? ?  ? ?ECOG: 0 ? ? ?General appearance: Alert, awake without any distress. ?Head: Atraumatic without abnormalities ?Oropharynx: Without any thrush or ulcers. ?Eyes: No scleral icterus. ?Lymph nodes: No lymphadenopathy noted in the cervical, supraclavicular, or axillary nodes ?Heart:regular rate and rhythm, without any murmurs or gallops.   ?Lung: Clear to auscultation without any rhonchi, wheezes or dullness to percussion. ?Abdomin: Soft, nontender without any shifting dullness or ascites. ?Musculoskeletal: No clubbing or cyanosis. ?Neurological: No motor or sensory deficits. ?Skin: No rashes or lesions. ? ? ? ? ? ? ? ? ? ? ? ? ?Lab Results: ?Lab Results  ?Component Value Date  ? WBC 8.9 10/20/2021  ? HGB 14.1 10/20/2021  ? HCT 41.3 10/20/2021  ? MCV 94.1 10/20/2021  ? PLT 250 10/20/2021  ? ?  Chemistry   ?   ?Component Value Date/Time  ? NA 140 10/20/2021 1459  ?  NA 142 08/19/2017 1410  ? K 3.8 10/20/2021 1459  ? K 3.8 08/19/2017 1410  ? CL 105 10/20/2021 1459  ? CO2 28 10/20/2021 1459  ? CO2 29 08/19/2017 1410  ? BUN 18 10/20/2021 1459  ? BUN 19.9 08/19/2017 1410  ? CREATININE 0.87 10/20/2021 1459  ? CREATININE 0.8 08/19/2017 1410  ?    ?Component Value Date/Time  ? CALCIUM 9.3 10/20/2021 1459  ? CALCIUM 9.9 08/19/2017 1410  ? ALKPHOS 62 10/20/2021 1459  ? ALKPHOS 89 08/19/2017 1410  ? AST 19 10/20/2021 1459  ? AST 20 08/19/2017 1410  ? ALT 10 10/20/2021 1459  ? ALT 14 08/19/2017 1410  ? BILITOT 0.9  10/20/2021 1459  ? BILITOT 0.60 08/19/2017 1410  ?  ? ? ? ?Impression and Plan: ? ?71 year old woman with: ? ?1.  CLL presented with lymphocytosis and adenopathy in 2018.  She has achieved complete hematological remission on the current therapy. ? ?Risks and benefits of continuing this treatment were reviewed at this time.  Potential complications that include cardiac toxicity including arrhythmia and heart failure as well as bleeding complications were reiterated. ? ?After discussion today, she asked for a treatment break to allow her to wall take ibuprofen or other nonsteroidal anti-inflammatory so she can treat her hip pain.  The risk of relapse was assessed and alternative treatment options were reviewed including [place].  At this time, it is reasonable to give her a brief treatment break and reevaluate her counts in 2 months. ? ?2.  Autoimmune considerations: I continue to educate her about these complications including autoimmune hemolysis as well as autoimmune thrombocytopenia. ? ?3.  Hypokalemia: Her potassium was normal in January 2023 and repeated today. ? ? ?4. Follow-up: In 2 months for repeat follow-up. ? ?30 minutes were spent on this encounter.  The time was dedicated to reviewing laboratory data, disease status update and future plan of care discussion. ? ?Alice Button, MD ?4/18/20231:09 PM ?

## 2022-01-27 ENCOUNTER — Other Ambulatory Visit (HOSPITAL_COMMUNITY): Payer: Self-pay

## 2022-02-18 ENCOUNTER — Other Ambulatory Visit: Payer: Self-pay | Admitting: Cardiology

## 2022-02-25 ENCOUNTER — Telehealth: Payer: Self-pay | Admitting: Oncology

## 2022-02-25 NOTE — Telephone Encounter (Signed)
Called patient regarding upcoming appointment, patient is notified. °

## 2022-03-25 ENCOUNTER — Other Ambulatory Visit: Payer: Self-pay

## 2022-03-25 ENCOUNTER — Inpatient Hospital Stay (HOSPITAL_BASED_OUTPATIENT_CLINIC_OR_DEPARTMENT_OTHER): Payer: 59 | Admitting: Oncology

## 2022-03-25 ENCOUNTER — Inpatient Hospital Stay: Payer: 59 | Attending: Oncology

## 2022-03-25 VITALS — BP 121/76 | HR 68 | Temp 97.7°F | Resp 196 | Wt 149.5 lb

## 2022-03-25 DIAGNOSIS — Z79899 Other long term (current) drug therapy: Secondary | ICD-10-CM | POA: Insufficient documentation

## 2022-03-25 DIAGNOSIS — C9110 Chronic lymphocytic leukemia of B-cell type not having achieved remission: Secondary | ICD-10-CM | POA: Diagnosis present

## 2022-03-25 LAB — CMP (CANCER CENTER ONLY)
ALT: 9 U/L (ref 0–44)
AST: 20 U/L (ref 15–41)
Albumin: 4.2 g/dL (ref 3.5–5.0)
Alkaline Phosphatase: 68 U/L (ref 38–126)
Anion gap: 6 (ref 5–15)
BUN: 17 mg/dL (ref 8–23)
CO2: 31 mmol/L (ref 22–32)
Calcium: 9.5 mg/dL (ref 8.9–10.3)
Chloride: 102 mmol/L (ref 98–111)
Creatinine: 0.97 mg/dL (ref 0.44–1.00)
GFR, Estimated: >60 mL/min (ref >60)
Glucose, Bld: 122 mg/dL — ABNORMAL HIGH (ref 70–99)
Potassium: 3.4 mmol/L — ABNORMAL LOW (ref 3.5–5.1)
Sodium: 139 mmol/L (ref 135–145)
Total Bilirubin: 0.8 mg/dL (ref 0.3–1.2)
Total Protein: 6.5 g/dL (ref 6.5–8.1)

## 2022-03-25 LAB — CBC WITH DIFFERENTIAL (CANCER CENTER ONLY)
Abs Immature Granulocytes: 0.07 10*3/uL (ref 0.00–0.07)
Basophils Absolute: 0.1 10*3/uL (ref 0.0–0.1)
Basophils Relative: 0 %
Eosinophils Absolute: 0 10*3/uL (ref 0.0–0.5)
Eosinophils Relative: 0 %
HCT: 38.7 % (ref 36.0–46.0)
Hemoglobin: 13.5 g/dL (ref 12.0–15.0)
Immature Granulocytes: 0 %
Lymphocytes Relative: 80 %
Lymphs Abs: 22.6 10*3/uL — ABNORMAL HIGH (ref 0.7–4.0)
MCH: 32.7 pg (ref 26.0–34.0)
MCHC: 34.9 g/dL (ref 30.0–36.0)
MCV: 93.7 fL (ref 80.0–100.0)
Monocytes Absolute: 1 10*3/uL (ref 0.1–1.0)
Monocytes Relative: 4 %
Neutro Abs: 4.6 10*3/uL (ref 1.7–7.7)
Neutrophils Relative %: 16 %
Platelet Count: 178 10*3/uL (ref 150–400)
RBC: 4.13 MIL/uL (ref 3.87–5.11)
RDW: 12.9 % (ref 11.5–15.5)
Smear Review: NORMAL
WBC Count: 28.4 10*3/uL — ABNORMAL HIGH (ref 4.0–10.5)
nRBC: 0 % (ref 0.0–0.2)

## 2022-03-25 NOTE — Progress Notes (Signed)
Hematology and Oncology Follow Up Visit  Alice Pollard [id number] 07-15-1951 71 y.o. 03/25/2022 1:03 PM Alice Pollard, MDSouth, Alice Main, MD   Principle Diagnosis: 71 year old woman with CLL presented with lymphocytosis and adenopathy in 2018.    Secondary diagnosis: Fibroadenoma of the left breast diagnosed in 2018.  No evidence of relapse after surgical resection.   Current therapy: Ibrutinib 420 mg started in December 2020.  Therapy withheld in April 2023 temporarily till June 2023.  Interim History: Alice Pollard returns today for follow-up.  Since her last visit, she reports feeling reasonably well without any major complaints.  She continues to have issues with her hip related to osteoarthritis.  Therapy interruption of improvement plan taking anti-inflammatories did not help her pain.  She has resumed ibrutinib in the last week.  She denies any nausea, vomiting or abdominal pain.  She denies any hospitalizations or illnesses.     Medications: Updated on review. Current Outpatient Medications  Medication Sig Dispense Refill   CALCIUM PO Take 600 mg by mouth daily. PATIENT NOT SURE OF DOSAGE     FLUoxetine (PROZAC) 20 MG tablet TAKE 1 TABLET(20 MG) BY MOUTH DAILY 90 tablet 2   hydrochlorothiazide (MICROZIDE) 12.5 MG capsule Take 1 capsule (12.5 mg total) by mouth daily. 90 capsule 3   ibrutinib (IMBRUVICA) 420 MG tablet TAKE 1 TABLET BY MOUTH ONCE DAILY WITH A FULL GLASS OF  WATER 28 tablet 0   Magnesium Gluconate (MAGNESIUM 27 PO) Take by mouth.     metoprolol succinate (TOPROL-XL) 25 MG 24 hr tablet TAKE 1 TABLET(25 MG) BY MOUTH DAILY 90 tablet 3   Multiple Vitamin (MULTIVITAMIN) tablet Take 1 tablet by mouth daily.     potassium chloride (K-DUR) 10 MEQ tablet Take 10 mEq by mouth daily.     telmisartan (MICARDIS) 80 MG tablet TAKE 1 TABLET BY MOUTH DAILY 90 tablet 3   No current facility-administered medications for this visit.     Allergies:  Allergies  Allergen Reactions    Codeine Other (See Comments)    Fainted    Latex Itching and Other (See Comments)    sensitivity   Other Other (See Comments)      Physical Exam:   Blood pressure 121/76, pulse 68, temperature 97.7 F (36.5 C), temperature source Tympanic, resp. rate (!) 196, weight 149 lb 8 oz (67.8 kg), last menstrual period 08/05/2007, SpO2 97 %.      ECOG: 0    General appearance: Comfortable appearing without any discomfort Head: Normocephalic without any trauma Oropharynx: Mucous membranes are moist and pink without any thrush or ulcers. Eyes: Pupils are equal and round reactive to light. Lymph nodes: No cervical, supraclavicular, inguinal or axillary lymphadenopathy.   Heart:regular rate and rhythm.  S1 and S2 without leg edema. Lung: Clear without any rhonchi or wheezes.  No dullness to percussion. Abdomin: Soft, nontender, nondistended with good bowel sounds.  No hepatosplenomegaly. Musculoskeletal: No joint deformity or effusion.  Full range of motion noted. Neurological: No deficits noted on motor, sensory and deep tendon reflex exam. Skin: No petechial rash or dryness.  Appeared moist.               Lab Results: Lab Results  Component Value Date   WBC 10.0 01/19/2022   HGB 14.8 01/19/2022   HCT 43.4 01/19/2022   MCV 93.5 01/19/2022   PLT 246 01/19/2022     Chemistry      Component Value Date/Time   NA 141 01/19/2022 1259  NA 142 08/19/2017 1410   K 3.4 (L) 01/19/2022 1259   K 3.8 08/19/2017 1410   CL 104 01/19/2022 1259   CO2 30 01/19/2022 1259   CO2 29 08/19/2017 1410   BUN 18 01/19/2022 1259   BUN 19.9 08/19/2017 1410   CREATININE 0.92 01/19/2022 1259   CREATININE 0.8 08/19/2017 1410      Component Value Date/Time   CALCIUM 9.5 01/19/2022 1259   CALCIUM 9.9 08/19/2017 1410   ALKPHOS 55 01/19/2022 1259   ALKPHOS 89 08/19/2017 1410   AST 18 01/19/2022 1259   AST 20 08/19/2017 1410   ALT 10 01/19/2022 1259   ALT 14 08/19/2017 1410   BILITOT  0.9 01/19/2022 1259   BILITOT 0.60 08/19/2017 1410       Impression and Plan:  71 year old woman with:  1.  CLL diagnosed in 2018 with lymphocytosis and adenopathy.  She achieved hematological remission on ibrutinib.     Her disease status was updated at this time.  She is currently on ibrutinib treatment break without any recent complications.  Risks and benefits of continuing this treatment were discussed at this time.  Laboratory data from today reviewed and showed increase in her white cell count of improvement.  Risks and benefits of continuing treatment were discussed.  She is agreeable to continue at this time.  The rise in the white cell count is an indication that she needs to be on this medication continuously.   2.  Autoimmune considerations: She has not experienced any complications at this time.  These include autoimmune hemolysis or autoimmune thrombocytopenia.  3.  Hypokalemia: Related to ibrutinib we will continue to monitor off treatment at this time.  4.  Arthritic hip pain: I recommended follow-up with orthopedics regarding this issue.  Recommended Tylenol for pain which is the safest to take to avoid nonsteroidal anti-inflammatory and bleeding risk.   5. Follow-up: In 2 months for repeat follow-up.  30 minutes were dedicated to this visit.  The time was spent on reviewing laboratory data, disease status update and outlining future plan of care discussion.  Zola Button, MD 6/22/20231:03 PM

## 2022-04-09 ENCOUNTER — Telehealth: Payer: Self-pay | Admitting: Oncology

## 2022-04-09 NOTE — Telephone Encounter (Signed)
Called patient regarding upcoming August appointments, patient is notified. 

## 2022-04-23 IMAGING — MG MM DIGITAL SCREENING BILAT W/ TOMO AND CAD
6 of 10 series · 6 of 30 positions shown · non-contrast
Comparison: Previous exam(s).

CLINICAL DATA: Screening.

EXAM:
DIGITAL SCREENING BILATERAL MAMMOGRAM WITH TOMOSYNTHESIS AND CAD
TECHNIQUE: Bilateral screening digital craniocaudal and mediolateral oblique
mammograms were obtained. Bilateral screening digital breast
tomosynthesis was performed. The images were evaluated with
computer-aided detection.

[L MLO synth-2D]
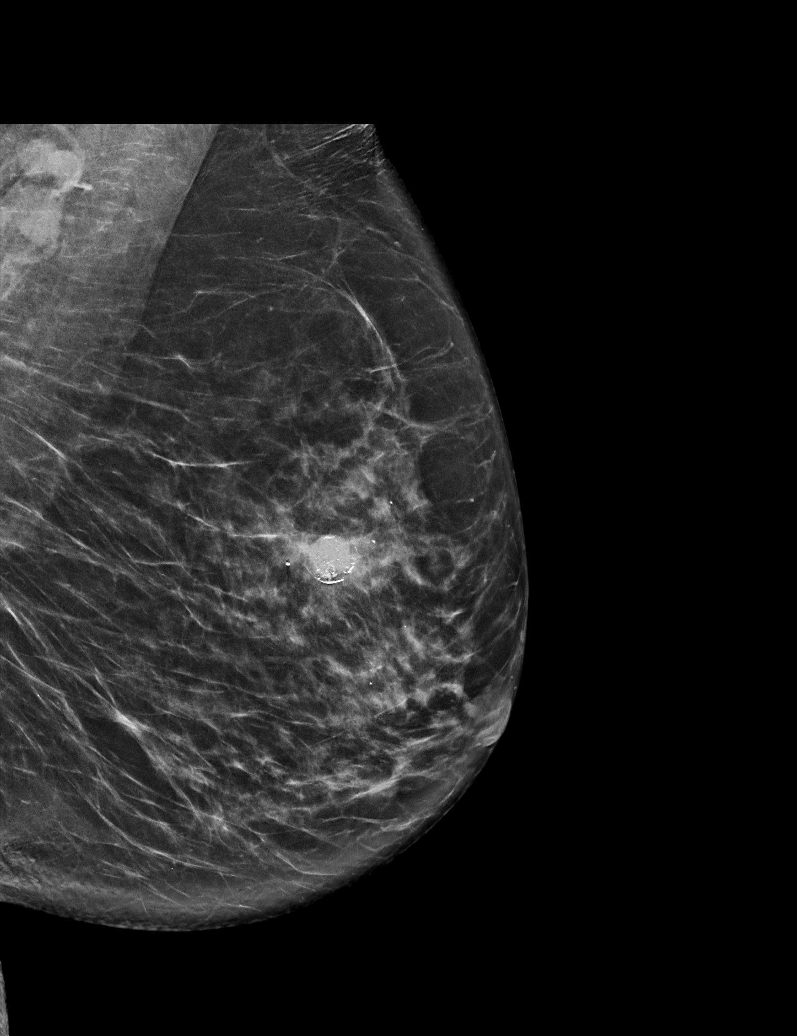

[L CC synth-2D]
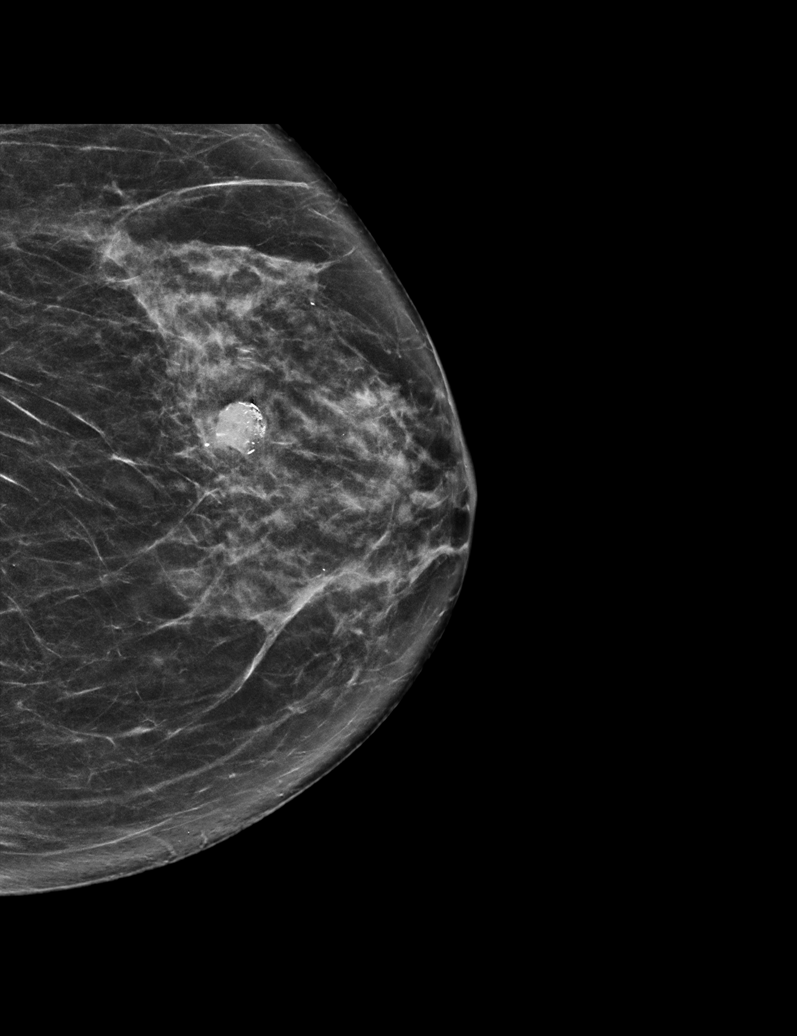

[R XCCL synth-2D]
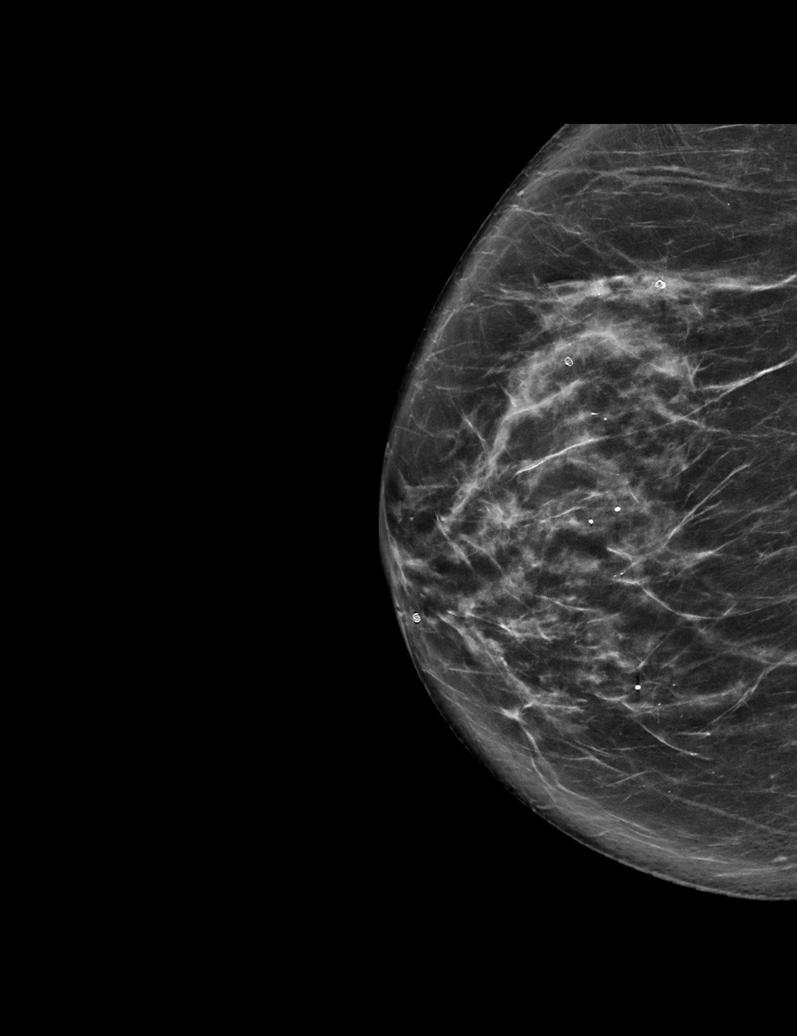

[R CC synth-2D]
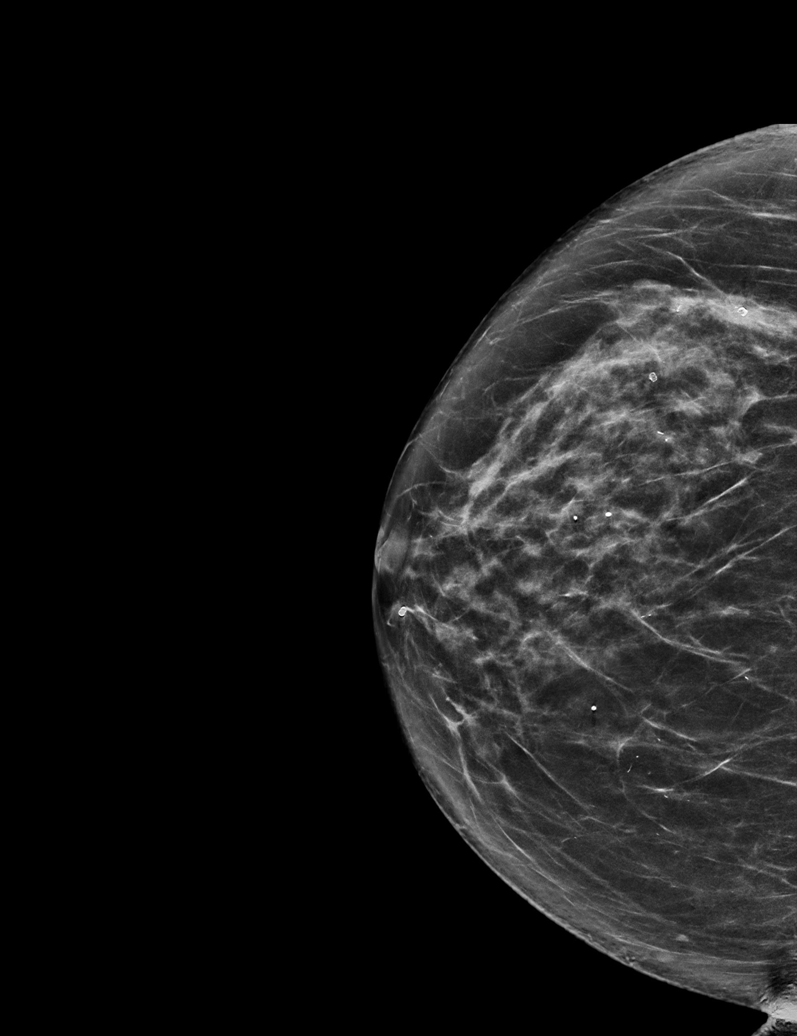

[R MLO synth-2D]
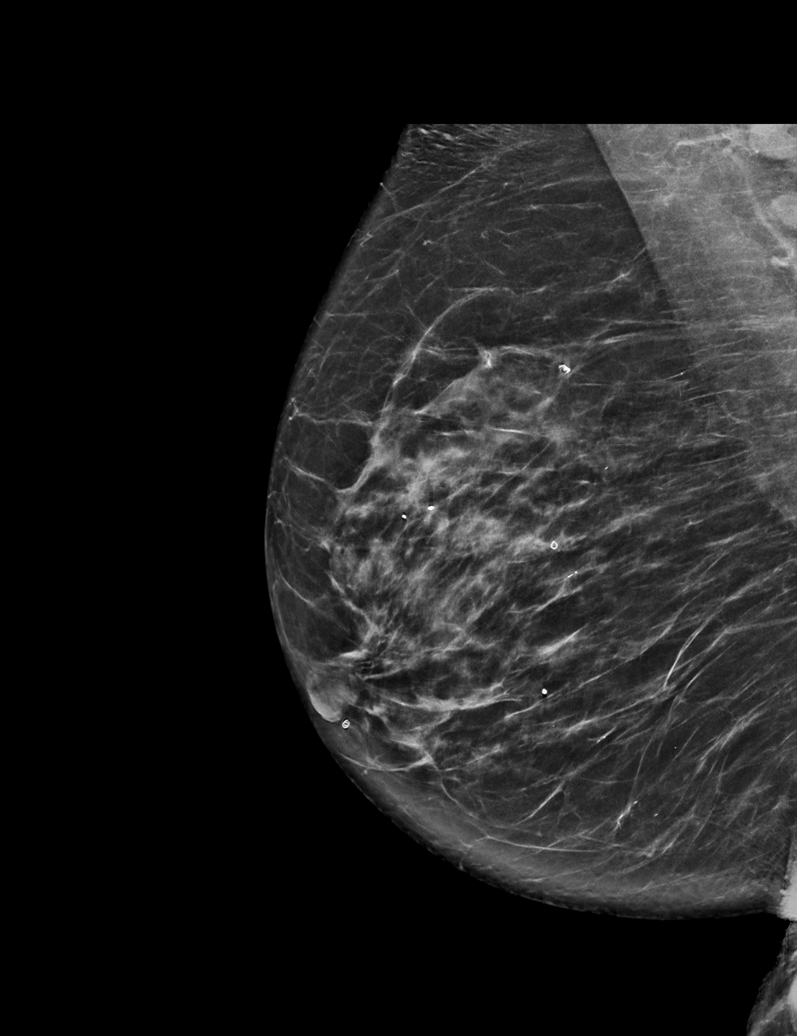

[L CC tomo · tomo slice 34/67.0]
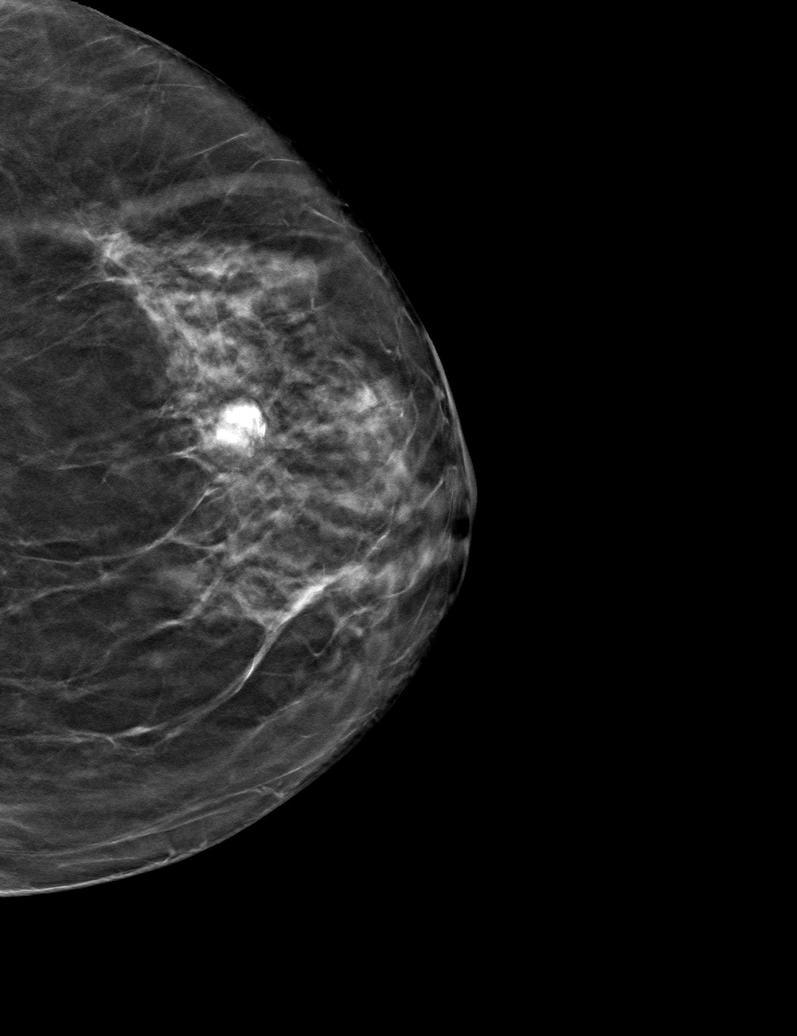

[6 of 30 positions shown; findings below may reference images not displayed]

ACR Breast Density Category c: The breast tissue is heterogeneously
dense, which may obscure small masses.
FINDINGS: There are no findings suspicious for malignancy.
IMPRESSION: No mammographic evidence of malignancy. A result letter of this
screening mammogram will be mailed directly to the patient.

RECOMMENDATION:
Screening mammogram in one year. (Code:Q3-W-BC3)

BI-RADS CATEGORY  1: Negative.

## 2022-05-28 ENCOUNTER — Inpatient Hospital Stay: Payer: 59 | Attending: Oncology

## 2022-05-28 ENCOUNTER — Other Ambulatory Visit: Payer: Self-pay

## 2022-05-28 ENCOUNTER — Inpatient Hospital Stay (HOSPITAL_BASED_OUTPATIENT_CLINIC_OR_DEPARTMENT_OTHER): Payer: 59 | Admitting: Oncology

## 2022-05-28 DIAGNOSIS — E876 Hypokalemia: Secondary | ICD-10-CM | POA: Diagnosis not present

## 2022-05-28 DIAGNOSIS — C9110 Chronic lymphocytic leukemia of B-cell type not having achieved remission: Secondary | ICD-10-CM | POA: Insufficient documentation

## 2022-05-28 DIAGNOSIS — M25559 Pain in unspecified hip: Secondary | ICD-10-CM | POA: Insufficient documentation

## 2022-05-28 DIAGNOSIS — D242 Benign neoplasm of left breast: Secondary | ICD-10-CM | POA: Diagnosis not present

## 2022-05-28 DIAGNOSIS — M25552 Pain in left hip: Secondary | ICD-10-CM | POA: Insufficient documentation

## 2022-05-28 LAB — CBC WITH DIFFERENTIAL (CANCER CENTER ONLY)
Abs Immature Granulocytes: 0.04 10*3/uL (ref 0.00–0.07)
Basophils Absolute: 0.1 10*3/uL (ref 0.0–0.1)
Basophils Relative: 0 %
Eosinophils Absolute: 0 10*3/uL (ref 0.0–0.5)
Eosinophils Relative: 0 %
HCT: 42 % (ref 36.0–46.0)
Hemoglobin: 14.8 g/dL (ref 12.0–15.0)
Immature Granulocytes: 0 %
Lymphocytes Relative: 60 %
Lymphs Abs: 9.2 10*3/uL — ABNORMAL HIGH (ref 0.7–4.0)
MCH: 32.6 pg (ref 26.0–34.0)
MCHC: 35.2 g/dL (ref 30.0–36.0)
MCV: 92.5 fL (ref 80.0–100.0)
Monocytes Absolute: 0.9 10*3/uL (ref 0.1–1.0)
Monocytes Relative: 6 %
Neutro Abs: 5.3 10*3/uL (ref 1.7–7.7)
Neutrophils Relative %: 34 %
Platelet Count: 236 10*3/uL (ref 150–400)
RBC: 4.54 MIL/uL (ref 3.87–5.11)
RDW: 12.5 % (ref 11.5–15.5)
Smear Review: NORMAL
WBC Count: 15.4 10*3/uL — ABNORMAL HIGH (ref 4.0–10.5)
nRBC: 0 % (ref 0.0–0.2)

## 2022-05-28 LAB — CMP (CANCER CENTER ONLY)
ALT: 11 U/L (ref 0–44)
AST: 19 U/L (ref 15–41)
Albumin: 4.4 g/dL (ref 3.5–5.0)
Alkaline Phosphatase: 57 U/L (ref 38–126)
Anion gap: 6 (ref 5–15)
BUN: 22 mg/dL (ref 8–23)
CO2: 30 mmol/L (ref 22–32)
Calcium: 9.8 mg/dL (ref 8.9–10.3)
Chloride: 104 mmol/L (ref 98–111)
Creatinine: 0.74 mg/dL (ref 0.44–1.00)
GFR, Estimated: >60 mL/min (ref >60)
Glucose, Bld: 97 mg/dL (ref 70–99)
Potassium: 4.1 mmol/L (ref 3.5–5.1)
Sodium: 140 mmol/L (ref 135–145)
Total Bilirubin: 0.9 mg/dL (ref 0.3–1.2)
Total Protein: 6.4 g/dL — ABNORMAL LOW (ref 6.5–8.1)

## 2022-05-28 MED ORDER — IBRUTINIB 420 MG PO TABS: ORAL_TABLET | ORAL | 3 refills | Status: DC

## 2022-05-28 NOTE — Progress Notes (Signed)
Hematology and Oncology Follow Up Visit  Alice Pollard [id number] Feb 04, 1951 71 y.o. 05/28/2022 12:49 PM Alice Pollard, MDSouth, Alice Main, MD   Principle Diagnosis: 71 year old woman with CLL diagnosed in 2018.  She was found to have lymphocytosis and adenopathy.  Secondary diagnosis: Fibroadenoma of the left breast diagnosed in 2018.  No evidence of relapse after surgical resection.   Current therapy: Ibrutinib 420 mg started in December 2020.  Therapy withheld in April 2023 temporarily till June 2023.  Interim History: Alice Pollard returns today for repeat evaluation.  Since last visit, she resumed taking ibrutinib without any major complications.  She denies any nausea, fatigue or vomiting.  She denies bruising or palpitations.  She denies any hospitalizations or illnesses.  She continues to have increased hip pain which is affecting her mobility at this time.  He has follow-up with orthopedics regarding this issue.    Medications: Reviewed without changes. Current Outpatient Medications  Medication Sig Dispense Refill   CALCIUM PO Take 600 mg by mouth daily. PATIENT NOT SURE OF DOSAGE     FLUoxetine (PROZAC) 20 MG tablet TAKE 1 TABLET(20 MG) BY MOUTH DAILY 90 tablet 2   hydrochlorothiazide (MICROZIDE) 12.5 MG capsule Take 1 capsule (12.5 mg total) by mouth daily. 90 capsule 3   ibrutinib (IMBRUVICA) 420 MG tablet TAKE 1 TABLET BY MOUTH ONCE DAILY WITH A FULL GLASS OF  WATER 28 tablet 0   Magnesium Gluconate (MAGNESIUM 27 PO) Take by mouth.     metoprolol succinate (TOPROL-XL) 25 MG 24 hr tablet TAKE 1 TABLET(25 MG) BY MOUTH DAILY 90 tablet 3   Multiple Vitamin (MULTIVITAMIN) tablet Take 1 tablet by mouth daily.     potassium chloride (K-DUR) 10 MEQ tablet Take 10 mEq by mouth daily.     telmisartan (MICARDIS) 80 MG tablet TAKE 1 TABLET BY MOUTH DAILY 90 tablet 3   No current facility-administered medications for this visit.     Allergies:  Allergies  Allergen Reactions    Codeine Other (See Comments)    Fainted    Latex Itching and Other (See Comments)    sensitivity   Other Other (See Comments)      Physical Exam:   Blood pressure (!) 143/88, pulse 65, temperature 98 F (36.7 C), temperature source Temporal, resp. rate 16, height '5\' 3"'$  (1.6 m), weight 149 lb 3.2 oz (67.7 kg), last menstrual period 08/05/2007, SpO2 98 %.       ECOG: 0   General appearance: Alert, awake without any distress. Head: Atraumatic without abnormalities Oropharynx: Without any thrush or ulcers. Eyes: No scleral icterus. Lymph nodes: No lymphadenopathy noted in the cervical, supraclavicular, or axillary nodes Heart:regular rate and rhythm, without any murmurs or gallops.   Lung: Clear to auscultation without any rhonchi, wheezes or dullness to percussion. Abdomin: Soft, nontender without any shifting dullness or ascites. Musculoskeletal: No clubbing or cyanosis. Neurological: No motor or sensory deficits. Skin: No rashes or lesions.                Lab Results: Lab Results  Component Value Date   WBC 28.4 (H) 03/25/2022   HGB 13.5 03/25/2022   HCT 38.7 03/25/2022   MCV 93.7 03/25/2022   PLT 178 03/25/2022     Chemistry      Component Value Date/Time   NA 139 03/25/2022 1301   NA 142 08/19/2017 1410   K 3.4 (L) 03/25/2022 1301   K 3.8 08/19/2017 1410   CL 102 03/25/2022 1301  CO2 31 03/25/2022 1301   CO2 29 08/19/2017 1410   BUN 17 03/25/2022 1301   BUN 19.9 08/19/2017 1410   CREATININE 0.97 03/25/2022 1301   CREATININE 0.8 08/19/2017 1410      Component Value Date/Time   CALCIUM 9.5 03/25/2022 1301   CALCIUM 9.9 08/19/2017 1410   ALKPHOS 68 03/25/2022 1301   ALKPHOS 89 08/19/2017 1410   AST 20 03/25/2022 1301   AST 20 08/19/2017 1410   ALT 9 03/25/2022 1301   ALT 14 08/19/2017 1410   BILITOT 0.8 03/25/2022 1301   BILITOT 0.60 08/19/2017 1410       Impression and Plan:  71 year old woman with:  1.  CLL presented with  lymphocytosis and adenopathy in 2018.   Risks and benefits of continuing ibrutinib were reviewed at this time.  Complications that include fatigue, bleeding, palpitations and cardiac toxicity were reiterated.  She is agreeable to continue at this time.  Laboratory data from today reviewed and showed improvement in her white cell count on treatment.  I recommended continuous treatment at this time.  2.  Autoimmune considerations: These complications are related to CLL and she has not experienced any including autoimmune thrombocytopenia and hemolytic anemia.  3.  Hypokalemia: Remains mild and asymptomatic.  We will continue to monitor and replace as needed.  4.  Arthritic hip pain: She has follow-up with orthopedics in the near future for possible   5. Follow-up: In 3 months for a follow-up.  30 minutes were spent on this visit.  The time was dedicated to reviewing laboratory data, treatment choices and addressing complications related to cancer and cancer therapy.   Zola Button, MD 8/25/202312:49 PM

## 2022-06-24 ENCOUNTER — Other Ambulatory Visit (HOSPITAL_COMMUNITY): Payer: Self-pay

## 2022-06-24 ENCOUNTER — Telehealth: Payer: Self-pay | Admitting: Pharmacy Technician

## 2022-06-24 NOTE — Telephone Encounter (Signed)
Oral Oncology Patient Advocate Encounter   Received notification that prior authorization for Imbruvica is required.   PA submitted on 06/24/2022 Questions must be answered and faxed back to [employer] once completed  Status is pending     Alice Pollard, [place] Patient [place] Direct Number: [phone]  Fax: [phone]

## 2022-06-25 ENCOUNTER — Other Ambulatory Visit (HOSPITAL_COMMUNITY): Payer: Self-pay

## 2022-06-25 NOTE — Telephone Encounter (Signed)
Oral Oncology Patient Advocate Encounter   Submitted prior authorization clinical questions to [employer].   Application submitted via e-fax to [phone]   Plan phone number [phone].   I will continue to check the status until final determination.   Lady Deutscher, CPhT-Adv Oncology Pharmacy Patient [place] Direct Number: [phone]  Fax: [phone]

## 2022-06-28 ENCOUNTER — Telehealth: Payer: Self-pay

## 2022-06-28 ENCOUNTER — Other Ambulatory Visit (HOSPITAL_COMMUNITY): Payer: Self-pay

## 2022-06-28 ENCOUNTER — Other Ambulatory Visit: Payer: Self-pay | Admitting: *Deleted

## 2022-06-28 DIAGNOSIS — C9110 Chronic lymphocytic leukemia of B-cell type not having achieved remission: Secondary | ICD-10-CM

## 2022-06-28 MED ORDER — IBRUTINIB 420 MG PO TABS: ORAL_TABLET | ORAL | 3 refills | Status: DC

## 2022-06-28 NOTE — Telephone Encounter (Signed)
Oral Chemotherapy Pharmacist Encounter  Patient has to fill at [facility]. New prescription sent in by RN/ MD to [employer].   Drema Halon, PharmD Hematology/Oncology Clinical Pharmacist [facility] [phone]

## 2022-06-28 NOTE — Telephone Encounter (Signed)
Oral Oncology Patient Advocate Encounter  Prior Authorization for Kate Sable has been approved.    PA# [id number] DP Effective dates: 06/26/2022 through 12/24/2022  Patient must fill at [facility]    Lady Deutscher, CPhT-Adv Oncology Pharmacy Patient [place] Direct Number: [phone]  Fax: [phone]

## 2022-06-29 ENCOUNTER — Other Ambulatory Visit (HOSPITAL_COMMUNITY): Payer: Self-pay

## 2022-08-27 ENCOUNTER — Inpatient Hospital Stay (HOSPITAL_BASED_OUTPATIENT_CLINIC_OR_DEPARTMENT_OTHER): Payer: 59 | Admitting: Oncology

## 2022-08-27 ENCOUNTER — Other Ambulatory Visit: Payer: Self-pay

## 2022-08-27 ENCOUNTER — Inpatient Hospital Stay: Payer: 59 | Attending: Oncology

## 2022-08-27 ENCOUNTER — Other Ambulatory Visit: Payer: 59

## 2022-08-27 ENCOUNTER — Ambulatory Visit: Payer: 59 | Admitting: Oncology

## 2022-08-27 DIAGNOSIS — E876 Hypokalemia: Secondary | ICD-10-CM | POA: Insufficient documentation

## 2022-08-27 DIAGNOSIS — M169 Osteoarthritis of hip, unspecified: Secondary | ICD-10-CM | POA: Insufficient documentation

## 2022-08-27 DIAGNOSIS — C9110 Chronic lymphocytic leukemia of B-cell type not having achieved remission: Secondary | ICD-10-CM

## 2022-08-27 DIAGNOSIS — D242 Benign neoplasm of left breast: Secondary | ICD-10-CM | POA: Diagnosis not present

## 2022-08-27 DIAGNOSIS — C9111 Chronic lymphocytic leukemia of B-cell type in remission: Secondary | ICD-10-CM | POA: Insufficient documentation

## 2022-08-27 LAB — CMP (CANCER CENTER ONLY)
ALT: 12 U/L (ref 0–44)
AST: 19 U/L (ref 15–41)
Albumin: 4.4 g/dL (ref 3.5–5.0)
Alkaline Phosphatase: 56 U/L (ref 38–126)
Anion gap: 7 (ref 5–15)
BUN: 19 mg/dL (ref 8–23)
CO2: 29 mmol/L (ref 22–32)
Calcium: 9.8 mg/dL (ref 8.9–10.3)
Chloride: 107 mmol/L (ref 98–111)
Creatinine: 0.93 mg/dL (ref 0.44–1.00)
GFR, Estimated: >60 mL/min (ref >60)
Glucose, Bld: 83 mg/dL (ref 70–99)
Potassium: 3.2 mmol/L — ABNORMAL LOW (ref 3.5–5.1)
Sodium: 143 mmol/L (ref 135–145)
Total Bilirubin: 0.9 mg/dL (ref 0.3–1.2)
Total Protein: 6.4 g/dL — ABNORMAL LOW (ref 6.5–8.1)

## 2022-08-27 LAB — CBC WITH DIFFERENTIAL (CANCER CENTER ONLY)
Abs Immature Granulocytes: 0.04 10*3/uL (ref 0.00–0.07)
Basophils Absolute: 0 10*3/uL (ref 0.0–0.1)
Basophils Relative: 0 %
Eosinophils Absolute: 0.1 10*3/uL (ref 0.0–0.5)
Eosinophils Relative: 0 %
HCT: 42.1 % (ref 36.0–46.0)
Hemoglobin: 14.6 g/dL (ref 12.0–15.0)
Immature Granulocytes: 0 %
Lymphocytes Relative: 44 %
Lymphs Abs: 5.4 10*3/uL — ABNORMAL HIGH (ref 0.7–4.0)
MCH: 33.6 pg (ref 26.0–34.0)
MCHC: 34.7 g/dL (ref 30.0–36.0)
MCV: 97 fL (ref 80.0–100.0)
Monocytes Absolute: 0.8 10*3/uL (ref 0.1–1.0)
Monocytes Relative: 6 %
Neutro Abs: 5.9 10*3/uL (ref 1.7–7.7)
Neutrophils Relative %: 50 %
Platelet Count: 215 10*3/uL (ref 150–400)
RBC: 4.34 MIL/uL (ref 3.87–5.11)
RDW: 13.2 % (ref 11.5–15.5)
WBC Count: 12.2 10*3/uL — ABNORMAL HIGH (ref 4.0–10.5)
nRBC: 0 % (ref 0.0–0.2)

## 2022-08-27 MED ORDER — IBRUTINIB 420 MG PO TABS: ORAL_TABLET | ORAL | 3 refills | Status: DC

## 2022-08-27 NOTE — Progress Notes (Signed)
Hematology and Oncology Follow Up Visit  Alice Pollard [id number] 01/03/1951 71 y.o. 08/27/2022 8:15 AM [name], MDSouth, [name], MD   Principle Diagnosis: 71 year old woman with CLL presented with lymphocytosis and adenopathy in 2018.   Secondary diagnosis: Fibroadenoma of the left breast diagnosed in 2018.  No evidence of relapse after surgical resection.   Current therapy: Ibrutinib 420 mg started in December 2020.  Therapy withheld in April 2023 temporarily till June 2023 was resumed at that time.  Interim History: Alice Pollard presents today for a follow-up visit.  Since her last visit, she reports feeling well without any major complaints.  She denies any nausea, vomiting or abdominal pain.  She denies any palpitation or bleeding.  She has very little bruising noted.  Her hip pain has improved after injection by orthopedics.  She denies any fevers, chills or lymphadenopathy.  He denies any constitutional symptoms.   Medications: Updated on review. Current Outpatient Medications  Medication Sig Dispense Refill   CALCIUM PO Take 600 mg by mouth daily. PATIENT NOT SURE OF DOSAGE     FLUoxetine (PROZAC) 20 MG tablet TAKE 1 TABLET(20 MG) BY MOUTH DAILY 90 tablet 2   hydrochlorothiazide (MICROZIDE) 12.5 MG capsule Take 1 capsule (12.5 mg total) by mouth daily. 90 capsule 3   ibrutinib (IMBRUVICA) 420 MG tablet TAKE 1 TABLET BY MOUTH ONCE DAILY WITH A FULL GLASS OF  WATER 28 tablet 3   Magnesium Gluconate (MAGNESIUM 27 PO) Take by mouth.     metoprolol succinate (TOPROL-XL) 25 MG 24 hr tablet TAKE 1 TABLET(25 MG) BY MOUTH DAILY 90 tablet 3   Multiple Vitamin (MULTIVITAMIN) tablet Take 1 tablet by mouth daily.     potassium chloride (K-DUR) 10 MEQ tablet Take 10 mEq by mouth daily.     telmisartan (MICARDIS) 80 MG tablet TAKE 1 TABLET BY MOUTH DAILY 90 tablet 3   No current facility-administered medications for this visit.     Allergies:  Allergies  Allergen Reactions    Codeine Other (See Comments)    Fainted    Latex Itching and Other (See Comments)    sensitivity   Other Other (See Comments)      Physical Exam:     Blood pressure 127/83, pulse 73, temperature (!) 97.3 F (36.3 C), temperature source Oral, resp. rate 14, weight 150 lb 3.2 oz (68.1 kg), last menstrual period 08/05/2007, SpO2 98 %.      ECOG: 0   General appearance: Comfortable appearing without any discomfort Head: Normocephalic without any trauma Oropharynx: Mucous membranes are moist and pink without any thrush or ulcers. Eyes: Pupils are equal and round reactive to light. Lymph nodes: No cervical, supraclavicular, inguinal or axillary lymphadenopathy.   Heart:regular rate and rhythm.  S1 and S2 without leg edema. Lung: Clear without any rhonchi or wheezes.  No dullness to percussion. Abdomin: Soft, nontender, nondistended with good bowel sounds.  No hepatosplenomegaly. Musculoskeletal: No joint deformity or effusion.  Full range of motion noted. Neurological: No deficits noted on motor, sensory and deep tendon reflex exam. Skin: No petechial rash or dryness.  Appeared moist.                 Lab Results: Lab Results  Component Value Date   WBC 15.4 (H) 05/28/2022   HGB 14.8 05/28/2022   HCT 42.0 05/28/2022   MCV 92.5 05/28/2022   PLT 236 05/28/2022     Chemistry      Component Value Date/Time   NA  140 05/28/2022 1247   NA 142 08/19/2017 1410   K 4.1 05/28/2022 1247   K 3.8 08/19/2017 1410   CL 104 05/28/2022 1247   CO2 30 05/28/2022 1247   CO2 29 08/19/2017 1410   BUN 22 05/28/2022 1247   BUN 19.9 08/19/2017 1410   CREATININE 0.74 05/28/2022 1247   CREATININE 0.8 08/19/2017 1410      Component Value Date/Time   CALCIUM 9.8 05/28/2022 1247   CALCIUM 9.9 08/19/2017 1410   ALKPHOS 57 05/28/2022 1247   ALKPHOS 89 08/19/2017 1410   AST 19 05/28/2022 1247   AST 20 08/19/2017 1410   ALT 11 05/28/2022 1247   ALT 14 08/19/2017 1410   BILITOT  0.9 05/28/2022 1247   BILITOT 0.60 08/19/2017 1410       Impression and Plan:  71 year old woman with:  1.  CLL diagnosed in 2018.  She presented with lymphocytosis and adenopathy and currently in remission.  She continues to tolerate ibrutinib without any complications at this time.  Risks and benefits of continuing this treatment were alternative options were discussed.  Chemotherapy, venetoclax among others will be deferred if she has disease progression.  She has tolerated this medication well with excellent overall hematological and clinical response.  CBC from today reviewed and showed near complete response and improvement in her white cell count.  No other abnormalities noted in her hemoglobin and platelets.  I recommended continuing this treatment for the time being and she is agreeable.  2.  Autoimmune considerations: She has not experienced any autoimmune thrombocytopenia or acute hemolytic anemia.  Will continue to monitor.  3.  Hypokalemia: We will continue to monitor on a Brittainy.  Potassium was normal in August 2023.  4.  Arthritis of the hip: Continues to follow with orthopedics regarding this issue.  She is status post injection of the left hip with improvement in her pain.   5. Follow-up: She will return in 3 months for a follow-up.  30 minutes were dedicated to this encounter.  The time was spent on updating disease status, treatment choices and outlining future plan of care discussion.  Alice Button, MD 11/24/20238:15 AM

## 2022-09-01 ENCOUNTER — Telehealth: Payer: Self-pay | Admitting: Cardiology

## 2022-09-01 MED ORDER — METOPROLOL SUCCINATE ER 25 MG PO TB24: 25.0000 mg | ORAL_TABLET | Freq: Every day | ORAL | 1 refills | Status: DC

## 2022-09-01 NOTE — Telephone Encounter (Signed)
*  STAT* If patient is at the pharmacy, call can be transferred to refill team.   1. Which medications need to be refilled? (please list name of each medication and dose if known) metoprolol succinate (TOPROL-XL) 25 MG 24 hr tablet   2. Which pharmacy/location (including street and city if local pharmacy) is medication to be sent to? [place], [place] - [address] AT [place]   3. Do they need a 30 day or 90 day supply? [place]

## 2022-09-01 NOTE — Telephone Encounter (Signed)
Refills has been sent to the pharmacy. 

## 2022-10-05 ENCOUNTER — Other Ambulatory Visit (HOSPITAL_COMMUNITY): Payer: Self-pay

## 2022-11-19 ENCOUNTER — Other Ambulatory Visit: Payer: Self-pay | Admitting: Cardiology

## 2022-11-22 ENCOUNTER — Inpatient Hospital Stay: Payer: 59 | Attending: Oncology

## 2022-11-22 ENCOUNTER — Other Ambulatory Visit: Payer: Self-pay | Admitting: Cardiology

## 2022-11-22 ENCOUNTER — Other Ambulatory Visit: Payer: Self-pay

## 2022-11-22 ENCOUNTER — Inpatient Hospital Stay (HOSPITAL_BASED_OUTPATIENT_CLINIC_OR_DEPARTMENT_OTHER): Payer: 59 | Admitting: Hematology

## 2022-11-22 VITALS — BP 125/92 | HR 65 | Temp 97.9°F | Resp 20 | Wt 150.7 lb

## 2022-11-22 DIAGNOSIS — E876 Hypokalemia: Secondary | ICD-10-CM | POA: Diagnosis not present

## 2022-11-22 DIAGNOSIS — M169 Osteoarthritis of hip, unspecified: Secondary | ICD-10-CM | POA: Insufficient documentation

## 2022-11-22 DIAGNOSIS — C9110 Chronic lymphocytic leukemia of B-cell type not having achieved remission: Secondary | ICD-10-CM | POA: Diagnosis not present

## 2022-11-22 DIAGNOSIS — I10 Essential (primary) hypertension: Secondary | ICD-10-CM | POA: Diagnosis not present

## 2022-11-22 DIAGNOSIS — C9111 Chronic lymphocytic leukemia of B-cell type in remission: Secondary | ICD-10-CM | POA: Diagnosis present

## 2022-11-22 LAB — CBC WITH DIFFERENTIAL (CANCER CENTER ONLY)
Abs Immature Granulocytes: 0.07 10*3/uL (ref 0.00–0.07)
Basophils Absolute: 0 10*3/uL (ref 0.0–0.1)
Basophils Relative: 0 %
Eosinophils Absolute: 0 10*3/uL (ref 0.0–0.5)
Eosinophils Relative: 0 %
HCT: 41.4 % (ref 36.0–46.0)
Hemoglobin: 14.6 g/dL (ref 12.0–15.0)
Immature Granulocytes: 1 %
Lymphocytes Relative: 40 %
Lymphs Abs: 5.8 10*3/uL — ABNORMAL HIGH (ref 0.7–4.0)
MCH: 33.8 pg (ref 26.0–34.0)
MCHC: 35.3 g/dL (ref 30.0–36.0)
MCV: 95.8 fL (ref 80.0–100.0)
Monocytes Absolute: 0.9 10*3/uL (ref 0.1–1.0)
Monocytes Relative: 7 %
Neutro Abs: 7.7 10*3/uL (ref 1.7–7.7)
Neutrophils Relative %: 52 %
Platelet Count: 235 10*3/uL (ref 150–400)
RBC: 4.32 MIL/uL (ref 3.87–5.11)
RDW: 12.3 % (ref 11.5–15.5)
WBC Count: 14.6 10*3/uL — ABNORMAL HIGH (ref 4.0–10.5)
nRBC: 0 % (ref 0.0–0.2)

## 2022-11-22 LAB — CMP (CANCER CENTER ONLY)
ALT: 12 U/L (ref 0–44)
AST: 20 U/L (ref 15–41)
Albumin: 4.1 g/dL (ref 3.5–5.0)
Alkaline Phosphatase: 51 U/L (ref 38–126)
Anion gap: 6 (ref 5–15)
BUN: 24 mg/dL — ABNORMAL HIGH (ref 8–23)
CO2: 28 mmol/L (ref 22–32)
Calcium: 9 mg/dL (ref 8.9–10.3)
Chloride: 104 mmol/L (ref 98–111)
Creatinine: 0.86 mg/dL (ref 0.44–1.00)
GFR, Estimated: >60 mL/min (ref >60)
Glucose, Bld: 91 mg/dL (ref 70–99)
Potassium: 3.8 mmol/L (ref 3.5–5.1)
Sodium: 138 mmol/L (ref 135–145)
Total Bilirubin: 0.9 mg/dL (ref 0.3–1.2)
Total Protein: 6.1 g/dL — ABNORMAL LOW (ref 6.5–8.1)

## 2022-11-22 MED ORDER — IBRUTINIB 420 MG PO TABS: ORAL_TABLET | ORAL | 5 refills | Status: DC

## 2022-11-22 MED ORDER — HYDROCHLOROTHIAZIDE 12.5 MG PO CAPS: 12.5000 mg | ORAL_CAPSULE | Freq: Every day | ORAL | 0 refills | Status: DC

## 2022-11-22 NOTE — Progress Notes (Signed)
HEMATOLOGY/ONCOLOGY CONSULTATION NOTE  Date of Service: 11/22/2022  Patient Care Team: [name], MD as PCP - General (Endocrinology)  CHIEF COMPLAINTS/PURPOSE OF CONSULTATION:  CLL (chronic lymphocytic leukemia) ([place])   HISTORY OF PRESENTING ILLNESS:  Alice Pollard is a wonderful 72 y.o. female who is here for continued evaluation and management of CLL. Patient was following up with Dr. Alen Blew.   Patient was diagnosed with CLL with lymphocytosis and adenopathy in 2018. Her secondary diagnosis was Fibroadenoma of the left breast diagnosed in 2018, which she had surgical resection and no evidence of relapse after the resection.   Patient's current treatment for CLL is Ibrutinib 420 mg, which was started in December 2020. Her treatment was temporarily withheld from April 2023 to July 2023 because she was diagnosed with hip arthritis and patient thought the joint pain was due to Ibritinib.   Patient's last visit with Dr. Alen Blew was on 08/27/2022 and she was doing well overall. She noted that her hip pain improved after steroid injection from her orthopedic physician.   Patient reports she has been doing well without any new medical concerns since her last visit with Dr. Alen Blew. She is tolerating her Ibritinib 420 mg without any severe toxicities. She does report occasional bruising due to Ibritinib.   Patient notes she previously had skin rashes near her upper back. She went to the dermatologist but could not figure out the reason for skin rash. Patient has not had any other skin rashes since then. She was on Ibritinib when she had the skin rashes.   She denies new lumps/bumps, infection issues, fever, chills, night sweats, shortness of breath, chest pain, abnormal bowel movement, abnormal bleeding issues, or leg swelling. She occasionally takes tylenol for pain management. She has degenerative disk issue.    MEDICAL HISTORY:  Past Medical History:  Diagnosis Date   ADD  (attention deficit disorder)    CLL (chronic lymphocytic leukemia) (HCC)    Dyslipidemia    Endometriosis    Hematuria    h/o asymptomatic   HTN (hypertension)    PONV (postoperative nausea and vomiting)     SURGICAL HISTORY: Past Surgical History:  Procedure Laterality Date   LAPAROSCOPY     endometriosis   RADIOACTIVE SEED GUIDED EXCISIONAL BREAST BIOPSY Left 09/13/2017   Procedure: RADIOACTIVE SEED GUIDED EXCISIONAL BREAST BIOPSY;  Surgeon: Rolm Bookbinder, MD;  Location: [place];  Service: General;  Laterality: Left;    SOCIAL HISTORY: Social History   Socioeconomic History   Marital status: Married    Spouse name: Not on file   Number of children: 3   Years of education: Not on file   Highest education level: Not on file  Occupational History   Occupation: Journalist, newspaper  Tobacco Use   Smoking status: Never   Smokeless tobacco: Never  Vaping Use   Vaping Use: Never used  Substance and Sexual Activity   Alcohol use: Not Currently   Drug use: No   Sexual activity: Not Currently    Partners: Male    Birth control/protection: Post-menopausal    Comment: vasectomy  Other Topics Concern   Not on file  Social History Narrative   Not on file   Social Determinants of Health   Financial Resource Strain: Not on file  Food Insecurity: Not on file  Transportation Needs: Not on file  Physical Activity: Not on file  Stress: Not on file  Social Connections: Not on file  Intimate Partner Violence: Not on  file    FAMILY HISTORY: Family History  Problem Relation Age of Onset   Heart failure Mother    Diabetes Father    Other Brother        bilateral bladder   Colon cancer Neg Hx     ALLERGIES:  is allergic to codeine, latex, and other.  MEDICATIONS:  Current Outpatient Medications  Medication Sig Dispense Refill   CALCIUM PO Take 600 mg by mouth daily. PATIENT NOT SURE OF DOSAGE     FLUoxetine (PROZAC) 20 MG tablet TAKE 1 TABLET(20  MG) BY MOUTH DAILY 90 tablet 2   hydrochlorothiazide (MICROZIDE) 12.5 MG capsule Take 1 capsule (12.5 mg total) by mouth daily. 90 capsule 3   ibrutinib (IMBRUVICA) 420 MG tablet TAKE 1 TABLET BY MOUTH ONCE DAILY WITH A FULL GLASS OF  WATER 28 tablet 3   Magnesium Gluconate (MAGNESIUM 27 PO) Take by mouth.     metoprolol succinate (TOPROL-XL) 25 MG 24 hr tablet Take 1 tablet (25 mg total) by mouth daily. 90 tablet 1   Multiple Vitamin (MULTIVITAMIN) tablet Take 1 tablet by mouth daily.     potassium chloride (K-DUR) 10 MEQ tablet Take 10 mEq by mouth daily.     telmisartan (MICARDIS) 80 MG tablet TAKE 1 TABLET BY MOUTH DAILY 90 tablet 3   No current facility-administered medications for this visit.    REVIEW OF SYSTEMS:    10 Point review of Systems was done is negative except as noted above.  PHYSICAL EXAMINATION: ECOG PERFORMANCE STATUS: 1 - Symptomatic but completely ambulatory  . Vitals:   11/22/22 1220  BP: (!) 125/92  Pulse: 65  Resp: 20  Temp: 97.9 F (36.6 C)  SpO2: 99%   Filed Weights   11/22/22 1220  Weight: 150 lb 11.2 oz (68.4 kg)   .Body mass index is 26.7 kg/m.  GENERAL:alert, in no acute distress and comfortable SKIN: no acute rashes, no significant lesions EYES: conjunctiva are pink and non-injected, sclera anicteric OROPHARYNX: MMM, no exudates, no oropharyngeal erythema or ulceration NECK: supple, no JVD LYMPH:  no palpable lymphadenopathy in the cervical, axillary or inguinal regions LUNGS: clear to auscultation b/l with normal respiratory effort HEART: regular rate & rhythm ABDOMEN:  normoactive bowel sounds , non tender, not distended. Extremity: no pedal edema PSYCH: alert & oriented x 3 with fluent speech NEURO: no focal motor/sensory deficits  LABORATORY DATA:  I have reviewed the data as listed .    Latest Ref Rng & Units 11/22/2022   11:52 AM 08/27/2022    8:23 AM 05/28/2022   12:47 PM  CBC  WBC 4.0 - 10.5 K/uL 14.6  12.2  15.4    Hemoglobin 12.0 - 15.0 g/dL 14.6  14.6  14.8   Hematocrit 36.0 - 46.0 % 41.4  42.1  42.0   Platelets 150 - 400 K/uL 235  215  236    .    Latest Ref Rng & Units 11/22/2022   11:52 AM 08/27/2022    8:23 AM 05/28/2022   12:47 PM  CMP  Glucose 70 - 99 mg/dL 91  83  97   BUN 8 - 23 mg/dL '24  19  22   '$ Creatinine 0.44 - 1.00 mg/dL 0.86  0.93  0.74   Sodium 135 - 145 mmol/L 138  143  140   Potassium 3.5 - 5.1 mmol/L 3.8  3.2  4.1   Chloride 98 - 111 mmol/L 104  107  104   CO2 22 -  32 mmol/L '28  29  30   '$ Calcium 8.9 - 10.3 mg/dL 9.0  9.8  9.8   Total Protein 6.5 - 8.1 g/dL 6.1  6.4  6.4   Total Bilirubin 0.3 - 1.2 mg/dL 0.9  0.9  0.9   Alkaline Phos 38 - 126 U/L 51  56  57   AST 15 - 41 U/L '20  19  19   '$ ALT 0 - 44 U/L '12  12  11    '$ . Lab Results  Component Value Date   LDH 280 (H) 02/28/2019     RADIOGRAPHIC STUDIES: I have personally reviewed the radiological images as listed and agreed with the findings in the report. No results found.  ASSESSMENT & PLAN:   72 year old woman with:   1.  CLL diagnosed in 2018.  She presented with lymphocytosis and adenopathy and currently in remission. Current treatment is Ibrutinib.     2.  Autoimmune considerations: She has not experienced any autoimmune thrombocytopenia or acute hemolytic anemia.  Will continue to monitor.   3.  Hypokalemia.   4.  Arthritis of the hip: Continues to follow with orthopedics regarding this issue.  She is status post injection of the left hip with improvement in her pain.  PLAN: -patient transferred care from Dr Alen Blew. All the  oncologic informationwas reviewed with the patient in detaild. -Discussed lab results from today, 11/22/2022, with the patient. CBC showed slightly elevated WBC of 14.6 otherwise stable.  CMP stable -Patient is tolerating Ibritinib 420 mg well without any severe toxicities.  -Patient will continue taking Ibritinib 420 mg as prescribed.  -Discussed the option on PET scan since she  started Ibrutinib in 2020. -Patient wants to continue with lab work and not plan on PET scan for now. PET scan if symptoms appear and if labs worsen.  -Answered all of patient's questions regarding CLL and Ibrutinib treatment.  -Recommended influenza vaccine, COVID-19 Booster and RSV vaccine.  -Recommended to follow-up with PCP regarding all age related vaccines.    FOLLOW-UP: RTC with Dr Irene Limbo with labs in 3 months  All of the patients questions were answered with apparent satisfaction. The patient knows to call the clinic with any problems, questions or concerns. .The total time spent in the appointment was 41 minutes* .  All of the patient's questions were answered with apparent satisfaction. The patient knows to call the clinic with any problems, questions or concerns.   Sullivan Lone MD MS AAHIVMS [facility] Hematology/Oncology Physician [facility]  .*Total Encounter Time as defined by the Centers for Medicare and Medicaid Services includes, in addition to the face-to-face time of a patient visit (documented in the note above) non-face-to-face time: obtaining and reviewing outside history, ordering and reviewing medications, tests or procedures, care coordination (communications with other health care professionals or caregivers) and documentation in the medical record.  11/22/2022 10:18 AM   I, Cleda Mccreedy, am acting as a Education administrator for Sullivan Lone, MD. .I have reviewed the above documentation for accuracy and completeness, and I agree with the above. Brunetta Genera MD

## 2022-11-23 ENCOUNTER — Telehealth: Payer: Self-pay | Admitting: Hematology

## 2022-11-23 NOTE — Telephone Encounter (Signed)
Called patient per 2/19 los notes to schedule f/u. Patient scheduled and notified.

## 2022-12-18 NOTE — Progress Notes (Unsigned)
Cardiology Office Note:    Date:  12/20/2022   ID:  [name], DOB 11-14-1950, MRN [id number]  PCP:  [name], MD  Cardiologist:  Dr. Jameek Bruntz [place]   Electrophysiologist:  n/a  Referring MD: [name], MD   Chief Complaint  Patient presents with   Palpitations    History of Present Illness:    Alice Pollard is a 72 y.o. female with a hx of HTN, HL, PVCs.  Seen in 2017.  Her ECG demonstrated asymptomatic PVCs in a bigeminal pattern. Echocardiogram demonstrated normal LV function. Patient was placed on Metoprolol tartrate 12.5 bid.    Seen by Richardson Dopp PAC in July 2017.  She never started the Metoprolol. This was resumed and a follow up Holter done in August. This showed PACs and PVCs with low arrhythmia burden with only 452 PVCs in 24 hours.   She has been diagnosed with stage 1 CLL and is followed by oncology. On Imbruvica.  On her last visit in 2021 we stopped HCTZ due to low BP. Since then BP has been consistently high. HCTZ resumed at lower dose. No palpitations. CLL is well controlled. No edema.  Past Medical History:  Diagnosis Date   ADD (attention deficit disorder)    CLL (chronic lymphocytic leukemia) (HCC)    Dyslipidemia    Endometriosis    Hematuria    h/o asymptomatic   HTN (hypertension)    PONV (postoperative nausea and vomiting)     Past Surgical History:  Procedure Laterality Date   LAPAROSCOPY     endometriosis   RADIOACTIVE SEED GUIDED EXCISIONAL BREAST BIOPSY Left 09/13/2017   Procedure: RADIOACTIVE SEED GUIDED EXCISIONAL BREAST BIOPSY;  Surgeon: Rolm Bookbinder, MD;  Location: [facility];  Service: General;  Laterality: Left;    Current Medications: Outpatient Medications Prior to Visit  Medication Sig Dispense Refill   CALCIUM PO Take 600 mg by mouth daily. PATIENT NOT SURE OF DOSAGE     FLUoxetine (PROZAC) 20 MG tablet TAKE 1 TABLET(20 MG) BY MOUTH DAILY 90 tablet 2   ibrutinib (IMBRUVICA) 420 MG tablet  TAKE 1 TABLET BY MOUTH ONCE DAILY WITH A FULL GLASS OF  WATER 28 tablet 5   Magnesium Gluconate (MAGNESIUM 27 PO) Take by mouth.     Multiple Vitamin (MULTIVITAMIN) tablet Take 1 tablet by mouth daily.     telmisartan (MICARDIS) 80 MG tablet TAKE 1 TABLET BY MOUTH DAILY 90 tablet 3   hydrochlorothiazide (MICROZIDE) 12.5 MG capsule TAKE 1 CAPSULE(12.5 MG) BY MOUTH DAILY 90 capsule 0   metoprolol succinate (TOPROL-XL) 25 MG 24 hr tablet Take 1 tablet (25 mg total) by mouth daily. 90 tablet 1   potassium chloride (K-DUR) 10 MEQ tablet Take 10 mEq by mouth daily.     No facility-administered medications prior to visit.      Allergies:   Codeine, Latex, and Other   Social History   Socioeconomic History   Marital status: Married    Spouse name: Not on file   Number of children: 3   Years of education: Not on file   Highest education level: Not on file  Occupational History   Occupation: Journalist, newspaper  Tobacco Use   Smoking status: Never   Smokeless tobacco: Never  Vaping Use   Vaping Use: Never used  Substance and Sexual Activity   Alcohol use: Not Currently   Drug use: No   Sexual activity: Not Currently    Partners: Male  Birth control/protection: Post-menopausal    Comment: vasectomy  Other Topics Concern   Not on file  Social History Narrative   Not on file   Social Determinants of Health   Financial Resource Strain: Not on file  Food Insecurity: Not on file  Transportation Needs: Not on file  Physical Activity: Not on file  Stress: Not on file  Social Connections: Not on file     Family History:  The patient's family history includes Diabetes in her father; Heart failure in her mother; Other in her brother.   ROS:   Please see the history of present illness.    ROS All other systems reviewed and are negative.   Physical Exam:    VS:  BP (!) 148/82   Pulse 62   Ht 5' 2.5" (1.588 m)   Wt 153 lb (69.4 kg)   LMP 08/05/2007   SpO2 98%   BMI 27.54  kg/m     Wt Readings from Last 3 Encounters:  12/20/22 153 lb (69.4 kg)  11/22/22 150 lb 11.2 oz (68.4 kg)  08/27/22 150 lb 3.2 oz (68.1 kg)    GENERAL:  Well appearing WF in NAD HEENT:  PERRL, EOMI, sclera are clear. Oropharynx is clear. NECK:  No jugular venous distention, carotid upstroke brisk and symmetric, no bruits, no thyromegaly or adenopathy LUNGS:  Clear to auscultation bilaterally CHEST:  Unremarkable HEART:  RRR  PMI not displaced or sustained,S1 and S2 within normal limits, no S3, no S4: no clicks, no rubs, no murmurs ABD:  Soft, nontender. BS +, no masses or bruits. No hepatomegaly, no splenomegaly EXT:  2 + pulses throughout, no edema, no cyanosis no clubbing SKIN:  Warm and dry.  No rashes NEURO:  Alert and oriented x 3. Cranial nerves II through XII intact. PSYCH:  Cognitively intact     Studies/Labs Reviewed:   EKG:  EKG is  ordered today.  NSR with rate 62.  Normal.  I have personally reviewed and interpreted this study.   Recent Labs: 11/22/2022: ALT 12; BUN 24; Creatinine 0.86; Hemoglobin 14.6; Platelet Count 235; Potassium 3.8; Sodium 138   Recent Lipid Panel    Component Value Date/Time   CHOL 225 (H) 04/16/2016 1002   TRIG 257 (H) 04/16/2016 1002   HDL 56 04/16/2016 1002   CHOLHDL 4.0 04/16/2016 1002   VLDL 51 (H) 04/16/2016 1002   LDLCALC 118 04/16/2016 1002   LDLDIRECT 121.6 01/15/2013 0915   Labs dated 01/16/18: cholesterol 305, triglycerides 461, HDL 56, LDL 157.  Dated 04/01/21: 248, triglycerides 209, HDL 87, LDL 119. Non HDL 161.  Dated 03/31/22: cholesterol 244, triglycerides 153, HDL 64, LDL 149.  Additional studies/ records that were reviewed today include:   Echo 04/16/16 Vigorous LVEF, EF 65-70%, normal wall motion, grade 1 diastolic dysfunction, trivial MR  ETT-Echo 3/08 Normal   Holter monitor 05/25/16 showed occ. PVCs and rare PACs. Arrhythmia burden was quite low with total PVCs of only 452.  ASSESSMENT:    1. PVC (premature  ventricular contraction)   2. Essential hypertension    PLAN:    In order of problems listed above:  1. PVCs - by prior Holter monitor she had low burden on metoprolol.  She is asymptomatic. EF is normal on Echo 2017.  Continue current therapy.   2. HTN - BP is well controlled on HCTZ, metoprolol, ARB  Follow up in one year.    Medication Adjustments/Labs and Tests Ordered: Current medicines are reviewed at length  with the patient today.  Concerns regarding medicines are outlined above.  Medication changes, Labs and Tests ordered today are outlined in the Patient Instructions noted below. There are no Patient Instructions on file for this visit.     Signed, Felesia Stahlecker [place], MD  12/20/2022 3:40 PM    [facility] [place], [place],   [address] Phone: [phone]; Fax: [phone]

## 2022-12-20 ENCOUNTER — Encounter: Payer: Self-pay | Admitting: Cardiology

## 2022-12-20 ENCOUNTER — Ambulatory Visit: Payer: 59 | Attending: Cardiology | Admitting: Cardiology

## 2022-12-20 VITALS — BP 148/82 | HR 62 | Ht 62.5 in | Wt 153.0 lb

## 2022-12-20 DIAGNOSIS — I10 Essential (primary) hypertension: Secondary | ICD-10-CM

## 2022-12-20 DIAGNOSIS — I493 Ventricular premature depolarization: Secondary | ICD-10-CM

## 2022-12-20 MED ORDER — HYDROCHLOROTHIAZIDE 12.5 MG PO CAPS: ORAL_CAPSULE | ORAL | 3 refills | Status: DC

## 2022-12-20 MED ORDER — METOPROLOL SUCCINATE ER 25 MG PO TB24: 25.0000 mg | ORAL_TABLET | Freq: Every day | ORAL | 3 refills | Status: DC

## 2023-01-12 ENCOUNTER — Telehealth: Payer: Self-pay | Admitting: Pharmacy Technician

## 2023-01-12 ENCOUNTER — Other Ambulatory Visit (HOSPITAL_COMMUNITY): Payer: Self-pay

## 2023-01-12 NOTE — Telephone Encounter (Signed)
Oral Oncology Patient Advocate Encounter   Received notification that prior authorization for Imbruvica is due for renewal.   PA will be submitted once signed by MD.  PA must be submitted via fax to [phone], Yetta Glassman processes Pas for this patient's plan.  Status is pending     Jinger Neighbors, CPhT-Adv Oncology Pharmacy Patient Advocate [facility] Direct Number: [phone]  Fax: [phone]

## 2023-01-13 NOTE — Telephone Encounter (Signed)
Oral Oncology Patient Advocate Encounter   Submitted PA forms for Imbruvica   Application submitted via e-fax to [phone]   Yetta Glassman phone number [phone].   I will continue to check the status until final determination.   Jinger Neighbors, CPhT-Adv Oncology Pharmacy Patient Advocate [facility] Direct Number: [phone]  Fax: [phone]

## 2023-01-13 NOTE — Telephone Encounter (Signed)
Oral Oncology Patient Advocate Encounter  Prior Authorization for Maurine Simmering has been approved.    PA# [id number] Effective dates: 01/13/23 through 07/15/23  Patient may continue to fill as usual..    Jinger Neighbors, CPhT-Adv Oncology Pharmacy Patient Advocate [facility] Direct Number: [phone]  Fax: [phone]

## 2023-02-04 ENCOUNTER — Other Ambulatory Visit: Payer: Self-pay

## 2023-02-04 DIAGNOSIS — C9110 Chronic lymphocytic leukemia of B-cell type not having achieved remission: Secondary | ICD-10-CM

## 2023-02-07 ENCOUNTER — Other Ambulatory Visit: Payer: Self-pay

## 2023-02-07 ENCOUNTER — Inpatient Hospital Stay (HOSPITAL_BASED_OUTPATIENT_CLINIC_OR_DEPARTMENT_OTHER): Payer: 59 | Admitting: Hematology

## 2023-02-07 ENCOUNTER — Inpatient Hospital Stay: Payer: 59 | Attending: Oncology

## 2023-02-07 VITALS — BP 132/77 | HR 61 | Temp 98.1°F | Resp 16 | Wt 152.2 lb

## 2023-02-07 DIAGNOSIS — Z800 Family history of malignant neoplasm of digestive organs: Secondary | ICD-10-CM | POA: Diagnosis not present

## 2023-02-07 DIAGNOSIS — E876 Hypokalemia: Secondary | ICD-10-CM | POA: Diagnosis not present

## 2023-02-07 DIAGNOSIS — D242 Benign neoplasm of left breast: Secondary | ICD-10-CM | POA: Insufficient documentation

## 2023-02-07 DIAGNOSIS — Z79899 Other long term (current) drug therapy: Secondary | ICD-10-CM | POA: Insufficient documentation

## 2023-02-07 DIAGNOSIS — M13851 Other specified arthritis, right hip: Secondary | ICD-10-CM | POA: Diagnosis not present

## 2023-02-07 DIAGNOSIS — R599 Enlarged lymph nodes, unspecified: Secondary | ICD-10-CM | POA: Insufficient documentation

## 2023-02-07 DIAGNOSIS — I10 Essential (primary) hypertension: Secondary | ICD-10-CM | POA: Diagnosis not present

## 2023-02-07 DIAGNOSIS — C9110 Chronic lymphocytic leukemia of B-cell type not having achieved remission: Secondary | ICD-10-CM

## 2023-02-07 DIAGNOSIS — M25669 Stiffness of unspecified knee, not elsewhere classified: Secondary | ICD-10-CM | POA: Insufficient documentation

## 2023-02-07 DIAGNOSIS — M13852 Other specified arthritis, left hip: Secondary | ICD-10-CM | POA: Insufficient documentation

## 2023-02-07 DIAGNOSIS — R21 Rash and other nonspecific skin eruption: Secondary | ICD-10-CM | POA: Diagnosis not present

## 2023-02-07 DIAGNOSIS — C9111 Chronic lymphocytic leukemia of B-cell type in remission: Secondary | ICD-10-CM | POA: Insufficient documentation

## 2023-02-07 DIAGNOSIS — E785 Hyperlipidemia, unspecified: Secondary | ICD-10-CM | POA: Insufficient documentation

## 2023-02-07 LAB — CMP (CANCER CENTER ONLY)
ALT: 13 U/L (ref 0–44)
AST: 21 U/L (ref 15–41)
Albumin: 4.2 g/dL (ref 3.5–5.0)
Alkaline Phosphatase: 57 U/L (ref 38–126)
Anion gap: 6 (ref 5–15)
BUN: 21 mg/dL (ref 8–23)
CO2: 30 mmol/L (ref 22–32)
Calcium: 9.1 mg/dL (ref 8.9–10.3)
Chloride: 105 mmol/L (ref 98–111)
Creatinine: 1.02 mg/dL — ABNORMAL HIGH (ref 0.44–1.00)
GFR, Estimated: 59 mL/min — ABNORMAL LOW (ref >60)
Glucose, Bld: 101 mg/dL — ABNORMAL HIGH (ref 70–99)
Potassium: 3.7 mmol/L (ref 3.5–5.1)
Sodium: 141 mmol/L (ref 135–145)
Total Bilirubin: 0.9 mg/dL (ref 0.3–1.2)
Total Protein: 6.2 g/dL — ABNORMAL LOW (ref 6.5–8.1)

## 2023-02-07 LAB — CBC WITH DIFFERENTIAL (CANCER CENTER ONLY)
Abs Immature Granulocytes: 0.03 10*3/uL (ref 0.00–0.07)
Basophils Absolute: 0 10*3/uL (ref 0.0–0.1)
Basophils Relative: 0 %
Eosinophils Absolute: 0 10*3/uL (ref 0.0–0.5)
Eosinophils Relative: 0 %
HCT: 41.2 % (ref 36.0–46.0)
Hemoglobin: 14.2 g/dL (ref 12.0–15.0)
Immature Granulocytes: 0 %
Lymphocytes Relative: 43 %
Lymphs Abs: 4.4 10*3/uL — ABNORMAL HIGH (ref 0.7–4.0)
MCH: 33 pg (ref 26.0–34.0)
MCHC: 34.5 g/dL (ref 30.0–36.0)
MCV: 95.8 fL (ref 80.0–100.0)
Monocytes Absolute: 0.7 10*3/uL (ref 0.1–1.0)
Monocytes Relative: 7 %
Neutro Abs: 5 10*3/uL (ref 1.7–7.7)
Neutrophils Relative %: 50 %
Platelet Count: 215 10*3/uL (ref 150–400)
RBC: 4.3 MIL/uL (ref 3.87–5.11)
RDW: 12.4 % (ref 11.5–15.5)
WBC Count: 10.2 10*3/uL (ref 4.0–10.5)
nRBC: 0 % (ref 0.0–0.2)

## 2023-02-07 NOTE — Progress Notes (Signed)
HEMATOLOGY/ONCOLOGY CLINIC NOTE  Date of Service: 02/07/2023  Patient Care Team: Adrian Prince, MD as PCP - General (Endocrinology)  CHIEF COMPLAINTS/PURPOSE OF CONSULTATION:  CLL (chronic lymphocytic leukemia) (HCC)   HISTORY OF PRESENTING ILLNESS:  Alice Pollard is a wonderful 72 y.o. female who is here for continued evaluation and management of CLL. Patient was following up with Dr. Clelia Croft.   Patient was diagnosed with CLL with lymphocytosis and adenopathy in 2018. Her secondary diagnosis was Fibroadenoma of the left breast diagnosed in 2018, which she had surgical resection and no evidence of relapse after the resection.   Patient's current treatment for CLL is Ibrutinib 420 mg, which was started in December 2020. Her treatment was temporarily withheld from April 2023 to July 2023 because she was diagnosed with hip arthritis and patient thought the joint pain was due to Ibritinib.   Patient's last visit with Dr. Clelia Croft was on 08/27/2022 and she was doing well overall. She noted that her hip pain improved after steroid injection from her orthopedic physician.   Patient reports she has been doing well without any new medical concerns since her last visit with Dr. Clelia Croft. She is tolerating her Ibritinib 420 mg without any severe toxicities. She does report occasional bruising due to Ibritinib.   Patient notes she previously had skin rashes near her upper back. She went to the dermatologist but could not figure out the reason for skin rash. Patient has not had any other skin rashes since then. She was on Ibritinib when she had the skin rashes.   She denies new lumps/bumps, infection issues, fever, chills, night sweats, shortness of breath, chest pain, abnormal bowel movement, abnormal bleeding issues, or leg swelling. She occasionally takes tylenol for pain management. She has degenerative disk issue.    INTERVAL HISTORY: Alice Pollard is a wonderful 72 y.o. female who is here  for continued evaluation and management of CLL.   Patient was last seen by me on 11/22/2022 and she was doing well overall.   Patient reports she has been doing well overall without any new or severe medical concerns since our last visit. She denies fever, chills, night sweats, unexpected weight loss, abdominal pain, chest pain, back pain, abnormal bowel moment, or leg swelling.   She regularly takes Ibrutinib 420 mg as prescribed without any severe toxicity. She does complain of bone pain and bilateral knee stiffness due to Ibrutinib. She denies abnormal bleeding issues, diarrhea, mouth swelling, or other toxicities with Ibrutinib.   MEDICAL HISTORY:  Past Medical History:  Diagnosis Date   ADD (attention deficit disorder)    CLL (chronic lymphocytic leukemia) (HCC)    Dyslipidemia    Endometriosis    Hematuria    h/o asymptomatic   HTN (hypertension)    PONV (postoperative nausea and vomiting)     SURGICAL HISTORY: Past Surgical History:  Procedure Laterality Date   LAPAROSCOPY     endometriosis   RADIOACTIVE SEED GUIDED EXCISIONAL BREAST BIOPSY Left 09/13/2017   Procedure: RADIOACTIVE SEED GUIDED EXCISIONAL BREAST BIOPSY;  Surgeon: Emelia Loron, MD;  Location: [facility];  Service: General;  Laterality: Left;    SOCIAL HISTORY: Social History   Socioeconomic History   Marital status: Married    Spouse name: Not on file   Number of children: 3   Years of education: Not on file   Highest education level: Not on file  Occupational History   Occupation: Soil scientist  Tobacco Use   Smoking  status: Never   Smokeless tobacco: Never  Vaping Use   Vaping Use: Never used  Substance and Sexual Activity   Alcohol use: Not Currently   Drug use: No   Sexual activity: Not Currently    Partners: Male    Birth control/protection: Post-menopausal    Comment: vasectomy  Other Topics Concern   Not on file  Social History Narrative   Not on file    Social Determinants of Health   Financial Resource Strain: Not on file  Food Insecurity: Not on file  Transportation Needs: Not on file  Physical Activity: Not on file  Stress: Not on file  Social Connections: Not on file  Intimate Partner Violence: Not on file    FAMILY HISTORY: Family History  Problem Relation Age of Onset   Heart failure Mother    Diabetes Father    Other Brother        bilateral bladder   Colon cancer Neg Hx     ALLERGIES:  is allergic to codeine, latex, and other.  MEDICATIONS:  Current Outpatient Medications  Medication Sig Dispense Refill   CALCIUM PO Take 600 mg by mouth daily. PATIENT NOT SURE OF DOSAGE     FLUoxetine (PROZAC) 20 MG tablet TAKE 1 TABLET(20 MG) BY MOUTH DAILY 90 tablet 2   hydrochlorothiazide (MICROZIDE) 12.5 MG capsule TAKE 1 CAPSULE(12.5 MG) BY MOUTH DAILY 90 capsule 3   ibrutinib (IMBRUVICA) 420 MG tablet TAKE 1 TABLET BY MOUTH ONCE DAILY WITH A FULL GLASS OF  WATER 28 tablet 5   Magnesium Gluconate (MAGNESIUM 27 PO) Take by mouth.     metoprolol succinate (TOPROL-XL) 25 MG 24 hr tablet Take 1 tablet (25 mg total) by mouth daily. 90 tablet 3   Multiple Vitamin (MULTIVITAMIN) tablet Take 1 tablet by mouth daily.     potassium chloride (K-DUR) 10 MEQ tablet Take 10 mEq by mouth daily.     telmisartan (MICARDIS) 80 MG tablet TAKE 1 TABLET BY MOUTH DAILY 90 tablet 3   No current facility-administered medications for this visit.    REVIEW OF SYSTEMS:    10 Point review of Systems was done is negative except as noted above.  PHYSICAL EXAMINATION: ECOG PERFORMANCE STATUS: 1 - Symptomatic but completely ambulatory  . Vitals:   02/07/23 1402  BP: 132/77  Pulse: 61  Resp: 16  Temp: 98.1 F (36.7 C)  SpO2: 98%    Filed Weights   02/07/23 1402  Weight: 152 lb 3.2 oz (69 kg)    .Body mass index is 27.39 kg/m.  GENERAL:alert, in no acute distress and comfortable SKIN: no acute rashes, no significant lesions EYES:  conjunctiva are pink and non-injected, sclera anicteric OROPHARYNX: MMM, no exudates, no oropharyngeal erythema or ulceration NECK: supple, no JVD LYMPH:  no palpable lymphadenopathy in the cervical, axillary or inguinal regions LUNGS: clear to auscultation b/l with normal respiratory effort HEART: regular rate & rhythm ABDOMEN:  normoactive bowel sounds , non tender, not distended. Extremity: no pedal edema PSYCH: alert & oriented x 3 with fluent speech NEURO: no focal motor/sensory deficits  LABORATORY DATA:  I have reviewed the data as listed .    Latest Ref Rng & Units 02/07/2023    1:36 PM 11/22/2022   11:52 AM 08/27/2022    8:23 AM  CBC  WBC 4.0 - 10.5 K/uL 10.2  14.6  12.2   Hemoglobin 12.0 - 15.0 g/dL 09.8  11.9  14.7   Hematocrit 36.0 - 46.0 % 41.2  41.4  42.1   Platelets 150 - 400 K/uL 215  235  215    .    Latest Ref Rng & Units 02/07/2023    1:36 PM 11/22/2022   11:52 AM 08/27/2022    8:23 AM  CMP  Glucose 70 - 99 mg/dL 161  91  83   BUN 8 - 23 mg/dL 21  24  19    Creatinine 0.44 - 1.00 mg/dL 0.96  0.45  4.09   Sodium 135 - 145 mmol/L 141  138  143   Potassium 3.5 - 5.1 mmol/L 3.7  3.8  3.2   Chloride 98 - 111 mmol/L 105  104  107   CO2 22 - 32 mmol/L 30  28  29    Calcium 8.9 - 10.3 mg/dL 9.1  9.0  9.8   Total Protein 6.5 - 8.1 g/dL 6.2  6.1  6.4   Total Bilirubin 0.3 - 1.2 mg/dL 0.9  0.9  0.9   Alkaline Phos 38 - 126 U/L 57  51  56   AST 15 - 41 U/L 21  20  19    ALT 0 - 44 U/L 13  12  12     . Lab Results  Component Value Date   LDH 280 (H) 02/28/2019     RADIOGRAPHIC STUDIES: I have personally reviewed the radiological images as listed and agreed with the findings in the report. No results found.  ASSESSMENT & PLAN:   72 year old woman with:   1.  CLL diagnosed in 2018.  She presented with lymphocytosis and adenopathy and currently in remission. Current treatment is Ibrutinib.     2.  Autoimmune considerations: She has not experienced any  autoimmune thrombocytopenia or acute hemolytic anemia.  Will continue to monitor.   3.  Hypokalemia.   4.  Arthritis of the hip: Continues to follow with orthopedics regarding this issue.  She is status post injection of the left hip with improvement in her pain.  PLAN: -Discussed lab results from today, 02/07/2023, with the patient. CBC is stable. CMP is stable -Patient is tolerating Ibritinib 420 mg well without any severe toxicities.  -Patient will continue taking Ibritinib 420 mg as prescribed.  -Answered all of patient's questions regarding CLL and Ibrutinib treatment.   FOLLOW-UP: RTC with Dr Candise Che with labs in 3 months   The total time spent in the appointment was 20 minutes* .  All of the patient's questions were answered with apparent satisfaction. The patient knows to call the clinic with any problems, questions or concerns.   Wyvonnia Lora MD MS AAHIVMS [facility] Hematology/Oncology Physician [facility]  .*Total Encounter Time as defined by the Centers for Medicare and Medicaid Services includes, in addition to the face-to-face time of a patient visit (documented in the note above) non-face-to-face time: obtaining and reviewing outside history, ordering and reviewing medications, tests or procedures, care coordination (communications with other health care professionals or caregivers) and documentation in the medical record.   I, Ok Edwards, am acting as a Neurosurgeon for Wyvonnia Lora, MD.  .I have reviewed the above documentation for accuracy and completeness, and I agree with the above. Johney Maine MD

## 2023-02-08 ENCOUNTER — Telehealth: Payer: Self-pay | Admitting: Hematology

## 2023-03-14 ENCOUNTER — Other Ambulatory Visit: Payer: Self-pay | Admitting: Cardiology

## 2023-05-25 ENCOUNTER — Telehealth: Payer: Self-pay | Admitting: Hematology

## 2023-05-25 NOTE — Telephone Encounter (Signed)
Rescheduled appointments per patients request via incoming call. Patient is having increased pain and has asked to be seen earlier. Patient is aware of the changes made to her upcoming appointments.

## 2023-05-27 ENCOUNTER — Other Ambulatory Visit: Payer: Self-pay

## 2023-05-27 DIAGNOSIS — C9110 Chronic lymphocytic leukemia of B-cell type not having achieved remission: Secondary | ICD-10-CM

## 2023-05-30 ENCOUNTER — Inpatient Hospital Stay: Payer: 59 | Attending: Hematology

## 2023-05-30 ENCOUNTER — Inpatient Hospital Stay: Payer: 59 | Admitting: Hematology

## 2023-05-30 VITALS — BP 134/80 | HR 63 | Temp 97.9°F | Resp 18 | Wt 144.7 lb

## 2023-05-30 DIAGNOSIS — M169 Osteoarthritis of hip, unspecified: Secondary | ICD-10-CM | POA: Diagnosis not present

## 2023-05-30 DIAGNOSIS — R21 Rash and other nonspecific skin eruption: Secondary | ICD-10-CM | POA: Insufficient documentation

## 2023-05-30 DIAGNOSIS — E876 Hypokalemia: Secondary | ICD-10-CM | POA: Diagnosis not present

## 2023-05-30 DIAGNOSIS — R059 Cough, unspecified: Secondary | ICD-10-CM | POA: Diagnosis not present

## 2023-05-30 DIAGNOSIS — R531 Weakness: Secondary | ICD-10-CM | POA: Insufficient documentation

## 2023-05-30 DIAGNOSIS — D709 Neutropenia, unspecified: Secondary | ICD-10-CM | POA: Diagnosis not present

## 2023-05-30 DIAGNOSIS — C9110 Chronic lymphocytic leukemia of B-cell type not having achieved remission: Secondary | ICD-10-CM

## 2023-05-30 DIAGNOSIS — C9111 Chronic lymphocytic leukemia of B-cell type in remission: Secondary | ICD-10-CM | POA: Diagnosis present

## 2023-05-30 LAB — CMP (CANCER CENTER ONLY)
ALT: 28 U/L (ref 0–44)
AST: 28 U/L (ref 15–41)
Albumin: 4 g/dL (ref 3.5–5.0)
Alkaline Phosphatase: 69 U/L (ref 38–126)
Anion gap: 7 (ref 5–15)
BUN: 16 mg/dL (ref 8–23)
CO2: 31 mmol/L (ref 22–32)
Calcium: 9.9 mg/dL (ref 8.9–10.3)
Chloride: 102 mmol/L (ref 98–111)
Creatinine: 0.91 mg/dL (ref 0.44–1.00)
GFR, Estimated: >60 mL/min (ref >60)
Glucose, Bld: 96 mg/dL (ref 70–99)
Potassium: 3.4 mmol/L — ABNORMAL LOW (ref 3.5–5.1)
Sodium: 140 mmol/L (ref 135–145)
Total Bilirubin: 0.8 mg/dL (ref 0.3–1.2)
Total Protein: 6.2 g/dL — ABNORMAL LOW (ref 6.5–8.1)

## 2023-05-30 LAB — CBC WITH DIFFERENTIAL (CANCER CENTER ONLY)
Abs Immature Granulocytes: 0 10*3/uL (ref 0.00–0.07)
Basophils Absolute: 0 10*3/uL (ref 0.0–0.1)
Basophils Relative: 0 %
Eosinophils Absolute: 0.1 10*3/uL (ref 0.0–0.5)
Eosinophils Relative: 2 %
HCT: 40.6 % (ref 36.0–46.0)
Hemoglobin: 14 g/dL (ref 12.0–15.0)
Lymphocytes Relative: 78 %
Lymphs Abs: 3.4 10*3/uL (ref 0.7–4.0)
MCH: 33.3 pg (ref 26.0–34.0)
MCHC: 34.5 g/dL (ref 30.0–36.0)
MCV: 96.4 fL (ref 80.0–100.0)
Monocytes Absolute: 0.4 10*3/uL (ref 0.1–1.0)
Monocytes Relative: 9 %
Neutro Abs: 0.5 10*3/uL — ABNORMAL LOW (ref 1.7–7.7)
Neutrophils Relative %: 11 %
Platelet Count: 143 10*3/uL — ABNORMAL LOW (ref 150–400)
RBC: 4.21 MIL/uL (ref 3.87–5.11)
RDW: 13 % (ref 11.5–15.5)
WBC Count: 4.3 10*3/uL (ref 4.0–10.5)
nRBC: 0 % (ref 0.0–0.2)

## 2023-05-30 NOTE — Progress Notes (Signed)
HEMATOLOGY/ONCOLOGY CLINIC NOTE  Date of Service: 05/30/2023  Patient Care Team: Adrian Prince, MD as PCP - General (Endocrinology)  CHIEF COMPLAINTS/PURPOSE OF CONSULTATION:  CLL (chronic lymphocytic leukemia) (HCC)   HISTORY OF PRESENTING ILLNESS:  Alice Pollard is a wonderful 72 y.o. female who is here for continued evaluation and management of CLL. Patient was following up with Dr. Clelia Croft.   Patient was diagnosed with CLL with lymphocytosis and adenopathy in 2018. Her secondary diagnosis was Fibroadenoma of the left breast diagnosed in 2018, which she had surgical resection and no evidence of relapse after the resection.   Patient's current treatment for CLL is Ibrutinib 420 mg, which was started in December 2020. Her treatment was temporarily withheld from April 2023 to July 2023 because she was diagnosed with hip arthritis and patient thought the joint pain was due to Ibritinib.   Patient's last visit with Dr. Clelia Croft was on 08/27/2022 and she was doing well overall. She noted that her hip pain improved after steroid injection from her orthopedic physician.   Patient reports she has been doing well without any new medical concerns since her last visit with Dr. Clelia Croft. She is tolerating her Ibritinib 420 mg without any severe toxicities. She does report occasional bruising due to Ibritinib.   Patient notes she previously had skin rashes near her upper back. She went to the dermatologist but could not figure out the reason for skin rash. Patient has not had any other skin rashes since then. She was on Ibritinib when she had the skin rashes.   She denies new lumps/bumps, infection issues, fever, chills, night sweats, shortness of breath, chest pain, abnormal bowel movement, abnormal bleeding issues, or leg swelling. She occasionally takes tylenol for pain management. She has degenerative disk issue.    INTERVAL HISTORY: Alice Pollard is a wonderful 72 y.o. female who is here  for continued evaluation and management of CLL.   Patient was last seen by me on 02/07/2023 and she was doing well overall.   Patient reports she has been doing well overall without any severe medical concerns since our last visit. She notes she went to [place] and had back pain after coming back. She has had many steroid shots to help with the pain. She follows up with her orthopedic surgeon.   She complains of left hip pain/weakness, which is fairly new.   Patient has been taking Ibuprofen around 1-2 times a day for the last week to help with her pain.    She denies any new infection issues, fever, chills, night sweats, unexpected weight loss, new lumps/bumps, abdominal pain, chest pain, abnormal bowel moment, or leg swelling. She does complain of mild cough.   Patient notes that her friends, that she went to [place] with, are sick.   Patient regularly takes Ibrutinib 420 mg as prescribed without any severe toxicity. She denies abnormal bleeding issues, diarrhea, mouth swelling, or other toxicities with Ibrutinib.   She is complaint with all of her medications.    MEDICAL HISTORY:  Past Medical History:  Diagnosis Date   ADD (attention deficit disorder)    CLL (chronic lymphocytic leukemia) (HCC)    Dyslipidemia    Endometriosis    Hematuria    h/o asymptomatic   HTN (hypertension)    PONV (postoperative nausea and vomiting)     SURGICAL HISTORY: Past Surgical History:  Procedure Laterality Date   LAPAROSCOPY     endometriosis   RADIOACTIVE SEED GUIDED EXCISIONAL BREAST BIOPSY  Left 09/13/2017   Procedure: RADIOACTIVE SEED GUIDED EXCISIONAL BREAST BIOPSY;  Surgeon: Emelia Loron, MD;  Location: [facility];  Service: General;  Laterality: Left;    SOCIAL HISTORY: Social History   Socioeconomic History   Marital status: Married    Spouse name: Not on file   Number of children: 3   Years of education: Not on file   Highest education level: Not on  file  Occupational History   Occupation: Soil scientist  Tobacco Use   Smoking status: Never   Smokeless tobacco: Never  Vaping Use   Vaping status: Never Used  Substance and Sexual Activity   Alcohol use: Not Currently   Drug use: No   Sexual activity: Not Currently    Partners: Male    Birth control/protection: Post-menopausal    Comment: vasectomy  Other Topics Concern   Not on file  Social History Narrative   Not on file   Social Determinants of Health   Financial Resource Strain: Not on file  Food Insecurity: Not on file  Transportation Needs: Not on file  Physical Activity: Not on file  Stress: Not on file  Social Connections: Not on file  Intimate Partner Violence: Not on file    FAMILY HISTORY: Family History  Problem Relation Age of Onset   Heart failure Mother    Diabetes Father    Other Brother        bilateral bladder   Colon cancer Neg Hx     ALLERGIES:  is allergic to codeine, latex, and other.  MEDICATIONS:  Current Outpatient Medications  Medication Sig Dispense Refill   CALCIUM PO Take 600 mg by mouth daily. PATIENT NOT SURE OF DOSAGE     FLUoxetine (PROZAC) 20 MG tablet TAKE 1 TABLET(20 MG) BY MOUTH DAILY 90 tablet 2   hydrochlorothiazide (MICROZIDE) 12.5 MG capsule TAKE 1 CAPSULE(12.5 MG) BY MOUTH DAILY 90 capsule 3   ibrutinib (IMBRUVICA) 420 MG tablet TAKE 1 TABLET BY MOUTH ONCE DAILY WITH A FULL GLASS OF  WATER 28 tablet 5   Magnesium Gluconate (MAGNESIUM 27 PO) Take by mouth.     metoprolol succinate (TOPROL-XL) 25 MG 24 hr tablet Take 1 tablet (25 mg total) by mouth daily. 90 tablet 3   Multiple Vitamin (MULTIVITAMIN) tablet Take 1 tablet by mouth daily.     potassium chloride (K-DUR) 10 MEQ tablet Take 10 mEq by mouth daily.     telmisartan (MICARDIS) 80 MG tablet TAKE 1 TABLET BY MOUTH DAILY 90 tablet 3   No current facility-administered medications for this visit.    REVIEW OF SYSTEMS:    10 Point review of Systems was done is  negative except as noted above.  PHYSICAL EXAMINATION: ECOG PERFORMANCE STATUS: 1 - Symptomatic but completely ambulatory  . Vitals:   05/30/23 0905  BP: 134/80  Pulse: 63  Resp: 18  Temp: 97.9 F (36.6 C)  SpO2: 98%   Filed Weights   05/30/23 0905  Weight: 144 lb 11.2 oz (65.6 kg)  .Body mass index is 26.04 kg/m.  GENERAL:alert, in no acute distress and comfortable SKIN: no acute rashes, no significant lesions EYES: conjunctiva are pink and non-injected, sclera anicteric OROPHARYNX: MMM, no exudates, no oropharyngeal erythema or ulceration NECK: supple, no JVD LYMPH:  no palpable lymphadenopathy in the cervical, axillary or inguinal regions LUNGS: clear to auscultation b/l with normal respiratory effort HEART: regular rate & rhythm ABDOMEN:  normoactive bowel sounds , non tender, not distended. Extremity: no pedal  edema PSYCH: alert & oriented x 3 with fluent speech NEURO: no focal motor/sensory deficits  LABORATORY DATA:  I have reviewed the data as listed .    Latest Ref Rng & Units 05/30/2023    8:26 AM 02/07/2023    1:36 PM 11/22/2022   11:52 AM  CBC  WBC 4.0 - 10.5 K/uL 4.3  10.2  14.6   Hemoglobin 12.0 - 15.0 g/dL 16.1  09.6  04.5   Hematocrit 36.0 - 46.0 % 40.6  41.2  41.4   Platelets 150 - 400 K/uL 143  215  235    .    Latest Ref Rng & Units 05/30/2023    8:26 AM 02/07/2023    1:36 PM 11/22/2022   11:52 AM  CMP  Glucose 70 - 99 mg/dL 96  409  91   BUN 8 - 23 mg/dL 16  21  24    Creatinine 0.44 - 1.00 mg/dL 8.11  9.14  7.82   Sodium 135 - 145 mmol/L 140  141  138   Potassium 3.5 - 5.1 mmol/L 3.4  3.7  3.8   Chloride 98 - 111 mmol/L 102  105  104   CO2 22 - 32 mmol/L 31  30  28    Calcium 8.9 - 10.3 mg/dL 9.9  9.1  9.0   Total Protein 6.5 - 8.1 g/dL 6.2  6.2  6.1   Total Bilirubin 0.3 - 1.2 mg/dL 0.8  0.9  0.9   Alkaline Phos 38 - 126 U/L 69  57  51   AST 15 - 41 U/L 28  21  20    ALT 0 - 44 U/L 28  13  12     . Lab Results  Component Value Date    LDH 280 (H) 02/28/2019     RADIOGRAPHIC STUDIES: I have personally reviewed the radiological images as listed and agreed with the findings in the report. No results found.  ASSESSMENT & PLAN:   72 year old woman with:   1.  CLL diagnosed in 2018.  She presented with lymphocytosis and adenopathy and currently in remission. Current treatment is Ibrutinib.     2.  Autoimmune considerations: She has not experienced any autoimmune thrombocytopenia or acute hemolytic anemia.  Will continue to monitor.   3.  Hypokalemia.   4.  Arthritis of the hip: Continues to follow with orthopedics regarding this issue.  She is status post injection of the left hip with improvement in her pain.  PLAN: -Discussed lab results from today, 05/30/2023, with the patient. CBC shows slightly decreased platelet counts ar 143 K, but was doing well overall. Patient is also neutropenic with neutropenia at 500 K. CMP is stable.  -Recommended to receive influenza vaccine, COVID-19 Booster, and RSV vaccine.  -Discussed that passing viral infection or Ibuprofen can be causing neutropenia. Discussed with the patient that neutropenia is most likely not due to CLL or Ibrutinib.  -Recommend to avoid Ibuprofen. Can take tylenol for pain management.   -Patient is tolerating Ibritinib 420 mg well without any severe toxicities.  -Patient will continue taking Ibritinib 420 mg as prescribed.  -Answered all of patient's questions regarding CLL and Ibrutinib treatment.    FOLLOW-UP: RTC with Dr Candise Che with labs in 2 months  The total time spent in the appointment was 20 minutes* .  All of the patient's questions were answered with apparent satisfaction. The patient knows to call the clinic with any problems, questions or concerns.   Wyvonnia Lora MD MS  AAHIVMS [facility] Hematology/Oncology Physician [facility]  .*Total Encounter Time as defined by the Centers for Medicare and Medicaid Services includes, in addition  to the face-to-face time of a patient visit (documented in the note above) non-face-to-face time: obtaining and reviewing outside history, ordering and reviewing medications, tests or procedures, care coordination (communications with other health care professionals or caregivers) and documentation in the medical record.   I,Param Shah,acting as a Neurosurgeon for Wyvonnia Lora, MD.,have documented all relevant documentation on the behalf of Wyvonnia Lora, MD,as directed by  Wyvonnia Lora, MD while in the presence of Wyvonnia Lora, MD.   .I have reviewed the above documentation for accuracy and completeness, and I agree with the above. Johney Maine MD

## 2023-06-13 ENCOUNTER — Ambulatory Visit: Payer: 59 | Admitting: Hematology

## 2023-06-13 ENCOUNTER — Other Ambulatory Visit: Payer: 59

## 2023-06-25 ENCOUNTER — Telehealth: Payer: Self-pay | Admitting: Hematology

## 2023-07-04 ENCOUNTER — Telehealth: Payer: Self-pay | Admitting: Pharmacy Technician

## 2023-07-04 ENCOUNTER — Other Ambulatory Visit (HOSPITAL_COMMUNITY): Payer: Self-pay

## 2023-07-04 NOTE — Telephone Encounter (Signed)
Oral Oncology Patient Advocate Encounter  Prior Authorization for Maurine Simmering has been approved.    PA# [id number] DP Effective dates: 07/04/23 through 01/01/24  Patient may continue to fill as usual.    Jinger Neighbors, CPhT-Adv Oncology Pharmacy Patient Advocate [facility] Direct Number: [phone]  Fax: [phone]

## 2023-07-04 NOTE — Telephone Encounter (Signed)
Oral Oncology Patient Advocate Encounter   Received notification that prior authorization for Imbruvica is due for renewal.   PA submitted on 07/04/23 Submitted to plan via e-fax [phone] Status is pending     Jinger Neighbors, CPhT-Adv Oncology Pharmacy Patient Advocate [facility] Direct Number: [phone]  Fax: [phone]

## 2023-07-14 ENCOUNTER — Other Ambulatory Visit: Payer: Self-pay | Admitting: Hematology

## 2023-07-14 DIAGNOSIS — C9110 Chronic lymphocytic leukemia of B-cell type not having achieved remission: Secondary | ICD-10-CM

## 2023-07-28 ENCOUNTER — Other Ambulatory Visit: Payer: Self-pay

## 2023-07-28 DIAGNOSIS — C9110 Chronic lymphocytic leukemia of B-cell type not having achieved remission: Secondary | ICD-10-CM

## 2023-08-01 ENCOUNTER — Inpatient Hospital Stay (HOSPITAL_BASED_OUTPATIENT_CLINIC_OR_DEPARTMENT_OTHER): Payer: 59 | Admitting: Hematology

## 2023-08-01 ENCOUNTER — Inpatient Hospital Stay: Payer: 59 | Attending: Hematology

## 2023-08-01 VITALS — BP 125/77 | HR 66 | Temp 97.7°F | Resp 18 | Wt 145.0 lb

## 2023-08-01 DIAGNOSIS — C9111 Chronic lymphocytic leukemia of B-cell type in remission: Secondary | ICD-10-CM | POA: Diagnosis present

## 2023-08-01 DIAGNOSIS — C9110 Chronic lymphocytic leukemia of B-cell type not having achieved remission: Secondary | ICD-10-CM

## 2023-08-01 DIAGNOSIS — M1612 Unilateral primary osteoarthritis, left hip: Secondary | ICD-10-CM | POA: Diagnosis not present

## 2023-08-01 DIAGNOSIS — E876 Hypokalemia: Secondary | ICD-10-CM | POA: Diagnosis not present

## 2023-08-01 LAB — CMP (CANCER CENTER ONLY)
ALT: 13 U/L (ref 0–44)
AST: 22 U/L (ref 15–41)
Albumin: 4 g/dL (ref 3.5–5.0)
Alkaline Phosphatase: 65 U/L (ref 38–126)
Anion gap: 5 (ref 5–15)
BUN: 25 mg/dL — ABNORMAL HIGH (ref 8–23)
CO2: 32 mmol/L (ref 22–32)
Calcium: 9.4 mg/dL (ref 8.9–10.3)
Chloride: 103 mmol/L (ref 98–111)
Creatinine: 0.9 mg/dL (ref 0.44–1.00)
GFR, Estimated: >60 mL/min (ref >60)
Glucose, Bld: 94 mg/dL (ref 70–99)
Potassium: 3.9 mmol/L (ref 3.5–5.1)
Sodium: 140 mmol/L (ref 135–145)
Total Bilirubin: 0.6 mg/dL (ref 0.3–1.2)
Total Protein: 6.2 g/dL — ABNORMAL LOW (ref 6.5–8.1)

## 2023-08-01 LAB — CBC WITH DIFFERENTIAL (CANCER CENTER ONLY)
Abs Immature Granulocytes: 0.02 10*3/uL (ref 0.00–0.07)
Basophils Absolute: 0 10*3/uL (ref 0.0–0.1)
Basophils Relative: 0 %
Eosinophils Absolute: 0 10*3/uL (ref 0.0–0.5)
Eosinophils Relative: 0 %
HCT: 40.5 % (ref 36.0–46.0)
Hemoglobin: 13.9 g/dL (ref 12.0–15.0)
Immature Granulocytes: 0 %
Lymphocytes Relative: 49 %
Lymphs Abs: 5 10*3/uL — ABNORMAL HIGH (ref 0.7–4.0)
MCH: 33.1 pg (ref 26.0–34.0)
MCHC: 34.3 g/dL (ref 30.0–36.0)
MCV: 96.4 fL (ref 80.0–100.0)
Monocytes Absolute: 2.2 10*3/uL — ABNORMAL HIGH (ref 0.1–1.0)
Monocytes Relative: 21 %
Neutro Abs: 3.1 10*3/uL (ref 1.7–7.7)
Neutrophils Relative %: 30 %
Platelet Count: 174 10*3/uL (ref 150–400)
RBC: 4.2 MIL/uL (ref 3.87–5.11)
RDW: 12.9 % (ref 11.5–15.5)
Smear Review: NORMAL
WBC Count: 10.3 10*3/uL (ref 4.0–10.5)
nRBC: 0 % (ref 0.0–0.2)

## 2023-08-01 NOTE — Progress Notes (Signed)
HEMATOLOGY/ONCOLOGY CLINIC NOTE  Date of Service: 08/01/2023  Patient Care Team: Adrian Prince, MD as PCP - General (Endocrinology)  CHIEF COMPLAINTS/PURPOSE OF CONSULTATION:  CLL (chronic lymphocytic leukemia) (HCC)   HISTORY OF PRESENTING ILLNESS:  Alice Pollard is a wonderful 72 y.o. female who is here for continued evaluation and management of CLL. Patient was following up with Dr. Clelia Croft.   Patient was diagnosed with CLL with lymphocytosis and adenopathy in 2018. Her secondary diagnosis was Fibroadenoma of the left breast diagnosed in 2018, which she had surgical resection and no evidence of relapse after the resection.   Patient's current treatment for CLL is Ibrutinib 420 mg, which was started in December 2020. Her treatment was temporarily withheld from April 2023 to July 2023 because she was diagnosed with hip arthritis and patient thought the joint pain was due to Ibritinib.   Patient's last visit with Dr. Clelia Croft was on 08/27/2022 and she was doing well overall. She noted that her hip pain improved after steroid injection from her orthopedic physician.   Patient reports she has been doing well without any new medical concerns since her last visit with Dr. Clelia Croft. She is tolerating her Ibritinib 420 mg without any severe toxicities. She does report occasional bruising due to Ibritinib.   Patient notes she previously had skin rashes near her upper back. She went to the dermatologist but could not figure out the reason for skin rash. Patient has not had any other skin rashes since then. She was on Ibritinib when she had the skin rashes.   She denies new lumps/bumps, infection issues, fever, chills, night sweats, shortness of breath, chest pain, abnormal bowel movement, abnormal bleeding issues, or leg swelling. She occasionally takes tylenol for pain management. She has degenerative disk issue.    INTERVAL HISTORY: Alice Pollard is a wonderful 72 y.o. female who is here  for continued evaluation and management of CLL.   Patient was last seen by me on 05/30/2023 and she complained of left hip pain/weakness and mild cough.   Patient notes she has been doing well overall since out last visit. She complains of worsened left hip pain due to arthritis. She has been following-up with her Orthopedic physician. Patient notes that she receives steroid injections, which helps with her pain. She also complains of mild occasional lower back pain.  She denies any new infection issues, fever, chills, night sweats, abdominal pain, chest pain, or leg swelling. She complains of diarrhea, which is going on for 3 weeks. Patient denies her diarrhea improving when she held Ibrutinib for 1 week.   Patient complains of mild occasional nigh sweats.   Patient notes she usually takes her Ibuitinib regularly, but she held it for one week since our last visit.  MEDICAL HISTORY:  Past Medical History:  Diagnosis Date   ADD (attention deficit disorder)    CLL (chronic lymphocytic leukemia) (HCC)    Dyslipidemia    Endometriosis    Hematuria    h/o asymptomatic   HTN (hypertension)    PONV (postoperative nausea and vomiting)     SURGICAL HISTORY: Past Surgical History:  Procedure Laterality Date   LAPAROSCOPY     endometriosis   RADIOACTIVE SEED GUIDED EXCISIONAL BREAST BIOPSY Left 09/13/2017   Procedure: RADIOACTIVE SEED GUIDED EXCISIONAL BREAST BIOPSY;  Surgeon: Emelia Loron, MD;  Location: [facility];  Service: General;  Laterality: Left;    SOCIAL HISTORY: Social History   Socioeconomic History   Marital status:  Married    Spouse name: Not on file   Number of children: 3   Years of education: Not on file   Highest education level: Not on file  Occupational History   Occupation: Soil scientist  Tobacco Use   Smoking status: Never   Smokeless tobacco: Never  Vaping Use   Vaping status: Never Used  Substance and Sexual Activity   Alcohol  use: Not Currently   Drug use: No   Sexual activity: Not Currently    Partners: Male    Birth control/protection: Post-menopausal    Comment: vasectomy  Other Topics Concern   Not on file  Social History Narrative   Not on file   Social Determinants of Health   Financial Resource Strain: Not on file  Food Insecurity: Not on file  Transportation Needs: Not on file  Physical Activity: Not on file  Stress: Not on file  Social Connections: Not on file  Intimate Partner Violence: Not on file    FAMILY HISTORY: Family History  Problem Relation Age of Onset   Heart failure Mother    Diabetes Father    Other Brother        bilateral bladder   Colon cancer Neg Hx     ALLERGIES:  is allergic to codeine, latex, and other.  MEDICATIONS:  Current Outpatient Medications  Medication Sig Dispense Refill   CALCIUM PO Take 600 mg by mouth daily. PATIENT NOT SURE OF DOSAGE     FLUoxetine (PROZAC) 20 MG tablet TAKE 1 TABLET(20 MG) BY MOUTH DAILY 90 tablet 2   hydrochlorothiazide (MICROZIDE) 12.5 MG capsule TAKE 1 CAPSULE(12.5 MG) BY MOUTH DAILY 90 capsule 3   IMBRUVICA 420 MG tablet TAKE 1 TABLET BY MOUTH ONCE  DAILY WITH A FULL GLASS OF WATER 28 tablet 5   Magnesium Gluconate (MAGNESIUM 27 PO) Take by mouth.     metoprolol succinate (TOPROL-XL) 25 MG 24 hr tablet Take 1 tablet (25 mg total) by mouth daily. 90 tablet 3   Multiple Vitamin (MULTIVITAMIN) tablet Take 1 tablet by mouth daily.     potassium chloride (K-DUR) 10 MEQ tablet Take 10 mEq by mouth daily.     telmisartan (MICARDIS) 80 MG tablet TAKE 1 TABLET BY MOUTH DAILY 90 tablet 3   No current facility-administered medications for this visit.    REVIEW OF SYSTEMS:    10 Point review of Systems was done is negative except as noted above.  PHYSICAL EXAMINATION: ECOG PERFORMANCE STATUS: 1 - Symptomatic but completely ambulatory  . Vitals:   08/01/23 0920  BP: 125/77  Pulse: 66  Resp: 18  Temp: 97.7 F (36.5 C)   SpO2: 100%    Filed Weights   08/01/23 0920  Weight: 145 lb (65.8 kg)   .Body mass index is 26.1 kg/m.  GENERAL:alert, in no acute distress and comfortable SKIN: no acute rashes, no significant lesions EYES: conjunctiva are pink and non-injected, sclera anicteric OROPHARYNX: MMM, no exudates, no oropharyngeal erythema or ulceration NECK: supple, no JVD LYMPH:  no palpable lymphadenopathy in the cervical, axillary or inguinal regions LUNGS: clear to auscultation b/l with normal respiratory effort HEART: regular rate & rhythm ABDOMEN:  normoactive bowel sounds , non tender, not distended. Extremity: no pedal edema PSYCH: alert & oriented x 3 with fluent speech NEURO: no focal motor/sensory deficits  LABORATORY DATA:  I have reviewed the data as listed .    Latest Ref Rng & Units 08/01/2023    8:57 AM 05/30/2023  8:26 AM 02/07/2023    1:36 PM  CBC  WBC 4.0 - 10.5 K/uL 10.3  4.3  10.2   Hemoglobin 12.0 - 15.0 g/dL 09.8  11.9  14.7   Hematocrit 36.0 - 46.0 % 40.5  40.6  41.2   Platelets 150 - 400 K/uL 174  143  215    .    Latest Ref Rng & Units 08/01/2023    8:57 AM 05/30/2023    8:26 AM 02/07/2023    1:36 PM  CMP  Glucose 70 - 99 mg/dL 94  96  829   BUN 8 - 23 mg/dL 25  16  21    Creatinine 0.44 - 1.00 mg/dL 5.62  1.30  8.65   Sodium 135 - 145 mmol/L 140  140  141   Potassium 3.5 - 5.1 mmol/L 3.9  3.4  3.7   Chloride 98 - 111 mmol/L 103  102  105   CO2 22 - 32 mmol/L 32  31  30   Calcium 8.9 - 10.3 mg/dL 9.4  9.9  9.1   Total Protein 6.5 - 8.1 g/dL 6.2  6.2  6.2   Total Bilirubin 0.3 - 1.2 mg/dL 0.6  0.8  0.9   Alkaline Phos 38 - 126 U/L 65  69  57   AST 15 - 41 U/L 22  28  21    ALT 0 - 44 U/L 13  28  13     . Lab Results  Component Value Date   LDH 280 (H) 02/28/2019     RADIOGRAPHIC STUDIES: I have personally reviewed the radiological images as listed and agreed with the findings in the report. No results found.  ASSESSMENT & PLAN:   72 year old woman  with:   1.  CLL diagnosed in 2018.  She presented with lymphocytosis and adenopathy and currently in remission. Current treatment is Ibrutinib.     2.  Autoimmune considerations: She has not experienced any autoimmune thrombocytopenia or acute hemolytic anemia.  Will continue to monitor.   3.  Hypokalemia.   4.  Arthritis of the hip: Continues to follow with orthopedics regarding this issue.  She is status post injection of the left hip with improvement in her pain.  PLAN: -Discussed lab results from today, 08/01/2023, in detail with the patient. CBC is stable. CMP stable -Discussed with the patient that she can hold magnesium supplement to see if diarrhea is improved.  -Recommend following-up with her orthopedic physician regarding worsening hip pain.   -Recommended to receive influenza vaccine, COVID-19 Booster, and RSV vaccine.  -Recommend to avoid Ibuprofen to reduce risk of bleeding with Ibruitnib.. Can take tylenol for pain management.   -Patient is tolerating Ibrutinib 420 mg well without any severe toxicities.  -Patient will continue taking Ibrutinib 420 mg as prescribed.  -Answered all of patient's questions regarding CLL and Ibrutinib treatment.   -Discussed with the patient that if patient's neutrophils are in the normal range, then she will continue Ibritinib 420 mg, if neutrophils are low, then we will decreased Ibrutinib dosage. Patient ANC is WNL at 3.1k and so we will continue Ibrutinib @ 420mg  po daily.  FOLLOW-UP: RTC with Dr Candise Che with labs in 2nd week of Jan 2025  The total time spent in the appointment was 23 minutes* .  All of the patient's questions were answered with apparent satisfaction. The patient knows to call the clinic with any problems, questions or concerns.   Wyvonnia Lora MD MS AAHIVMS [facility] Hematology/Oncology Physician  [facility]  .*Total Encounter Time as defined by the Centers for Medicare and Medicaid Services includes, in  addition to the face-to-face time of a patient visit (documented in the note above) non-face-to-face time: obtaining and reviewing outside history, ordering and reviewing medications, tests or procedures, care coordination (communications with other health care professionals or caregivers) and documentation in the medical record.   I,Param Shah,acting as a Neurosurgeon for Wyvonnia Lora, MD.,have documented all relevant documentation on the behalf of Wyvonnia Lora, MD,as directed by  Wyvonnia Lora, MD while in the presence of Wyvonnia Lora, MD.   .I have reviewed the above documentation for accuracy and completeness, and I agree with the above. Johney Maine MD

## 2023-08-02 ENCOUNTER — Telehealth: Payer: Self-pay | Admitting: Hematology

## 2023-08-02 NOTE — Telephone Encounter (Signed)
Per Candise Che 10/28 LOS left patient a message in regards to scheduled appointment times/dates for follow up; left callback if needed for rescheduling

## 2023-10-18 ENCOUNTER — Other Ambulatory Visit: Payer: Self-pay

## 2023-10-18 DIAGNOSIS — C9110 Chronic lymphocytic leukemia of B-cell type not having achieved remission: Secondary | ICD-10-CM

## 2023-10-19 ENCOUNTER — Inpatient Hospital Stay: Payer: 59 | Attending: Hematology

## 2023-10-19 ENCOUNTER — Inpatient Hospital Stay (HOSPITAL_BASED_OUTPATIENT_CLINIC_OR_DEPARTMENT_OTHER): Payer: 59 | Admitting: Hematology

## 2023-10-19 VITALS — BP 129/90 | HR 72 | Temp 97.5°F | Resp 18 | Ht 62.0 in | Wt 145.5 lb

## 2023-10-19 DIAGNOSIS — C9111 Chronic lymphocytic leukemia of B-cell type in remission: Secondary | ICD-10-CM | POA: Diagnosis present

## 2023-10-19 DIAGNOSIS — R21 Rash and other nonspecific skin eruption: Secondary | ICD-10-CM | POA: Insufficient documentation

## 2023-10-19 DIAGNOSIS — C9110 Chronic lymphocytic leukemia of B-cell type not having achieved remission: Secondary | ICD-10-CM

## 2023-10-19 DIAGNOSIS — E876 Hypokalemia: Secondary | ICD-10-CM | POA: Insufficient documentation

## 2023-10-19 LAB — CMP (CANCER CENTER ONLY)
ALT: 13 U/L (ref 0–44)
AST: 20 U/L (ref 15–41)
Albumin: 4.4 g/dL (ref 3.5–5.0)
Alkaline Phosphatase: 57 U/L (ref 38–126)
Anion gap: 6 (ref 5–15)
BUN: 21 mg/dL (ref 8–23)
CO2: 31 mmol/L (ref 22–32)
Calcium: 9.7 mg/dL (ref 8.9–10.3)
Chloride: 105 mmol/L (ref 98–111)
Creatinine: 1.01 mg/dL — ABNORMAL HIGH (ref 0.44–1.00)
GFR, Estimated: 59 mL/min — ABNORMAL LOW (ref >60)
Glucose, Bld: 83 mg/dL (ref 70–99)
Potassium: 3.5 mmol/L (ref 3.5–5.1)
Sodium: 142 mmol/L (ref 135–145)
Total Bilirubin: 0.7 mg/dL (ref 0.0–1.2)
Total Protein: 6.6 g/dL (ref 6.5–8.1)

## 2023-10-19 LAB — CBC WITH DIFFERENTIAL (CANCER CENTER ONLY)
Abs Immature Granulocytes: 0.03 10*3/uL (ref 0.00–0.07)
Basophils Absolute: 0 10*3/uL (ref 0.0–0.1)
Basophils Relative: 0 %
Eosinophils Absolute: 0 10*3/uL (ref 0.0–0.5)
Eosinophils Relative: 0 %
HCT: 41.6 % (ref 36.0–46.0)
Hemoglobin: 14.5 g/dL (ref 12.0–15.0)
Immature Granulocytes: 0 %
Lymphocytes Relative: 48 %
Lymphs Abs: 4.4 10*3/uL — ABNORMAL HIGH (ref 0.7–4.0)
MCH: 33 pg (ref 26.0–34.0)
MCHC: 34.9 g/dL (ref 30.0–36.0)
MCV: 94.8 fL (ref 80.0–100.0)
Monocytes Absolute: 0.8 10*3/uL (ref 0.1–1.0)
Monocytes Relative: 8 %
Neutro Abs: 4.1 10*3/uL (ref 1.7–7.7)
Neutrophils Relative %: 44 %
Platelet Count: 242 10*3/uL (ref 150–400)
RBC: 4.39 MIL/uL (ref 3.87–5.11)
RDW: 12 % (ref 11.5–15.5)
WBC Count: 9.3 10*3/uL (ref 4.0–10.5)
nRBC: 0 % (ref 0.0–0.2)

## 2023-10-19 NOTE — Progress Notes (Signed)
HEMATOLOGY/ONCOLOGY CLINIC NOTE  Date of Service: 10/19/2023  Patient Care Team: Adrian Prince, MD as PCP - General (Endocrinology)  CHIEF COMPLAINTS/PURPOSE OF CONSULTATION:  CLL (chronic lymphocytic leukemia) (HCC)   HISTORY OF PRESENTING ILLNESS:  Alice Pollard is a wonderful 73 y.o. female who is here for continued evaluation and management of CLL. Patient was following up with Dr. Clelia Croft.   Patient was diagnosed with CLL with lymphocytosis and adenopathy in 2018. Her secondary diagnosis was Fibroadenoma of the left breast diagnosed in 2018, which she had surgical resection and no evidence of relapse after the resection.   Patient's current treatment for CLL is Ibrutinib 420 mg, which was started in December 2020. Her treatment was temporarily withheld from April 2023 to July 2023 because she was diagnosed with hip arthritis and patient thought the joint pain was due to Ibritinib.   Patient's last visit with Dr. Clelia Croft was on 08/27/2022 and she was doing well overall. She noted that her hip pain improved after steroid injection from her orthopedic physician.   Patient reports she has been doing well without any new medical concerns since her last visit with Dr. Clelia Croft. She is tolerating her Ibritinib 420 mg without any severe toxicities. She does report occasional bruising due to Ibritinib.   Patient notes she previously had skin rashes near her upper back. She went to the dermatologist but could not figure out the reason for skin rash. Patient has not had any other skin rashes since then. She was on Ibritinib when she had the skin rashes.   She denies new lumps/bumps, infection issues, fever, chills, night sweats, shortness of breath, chest pain, abnormal bowel movement, abnormal bleeding issues, or leg swelling. She occasionally takes tylenol for pain management. She has degenerative disk issue.    INTERVAL HISTORY: Alice Pollard is a wonderful 73 y.o. female who is here  for continued evaluation and management of CLL.   Patient was last seen by me on 08/01/2023 and complained of worsened left hip pain due to arthritis, mild occasional lower back pain, diarrhea, and mild occasional night sweats.   Today, she reports that there are plans for an upcoming left hip replacement on February 13th, 2025. Her Water quality scientist was noted to be Madlyn Frankel. Charlann Boxer, MD at [facility].   She is no longer on Fluoxetine 20 MG. Patient is on Zetia.   She reports having a fall recently after tripping on her toe. Patient notes that she was able to break her fall. She reports an injury to her finger and mild bruise in the right upper extremity. Patient denies any other injury from the fall.   She reports endorsing a persistent rash near the left collarbone present for 5-10 years, which she attributes to psoriatic arthritis. Patient notes that her rash was triggered by wearing gold jewelry. Her rash does not worsen with seasonal changes. Patient reports that she previously used hydrocortisone cream three years ago which did improve symptoms.   Patient also reports having a rash on her leg for a while, face, and her back. Her skin rashes are not itchy.   Patient was previously seen by a dermatologist a few years ago for removal of a lesion in the left collarbone area.   She has been on Ibrutinib for a while.   She reports a hx of degenerative disc issues.   Patient denies any other new lumps/bumps, back pain, abdominal pain, leg swelling, or infection issues.   Patient endorses arthritis  in her hip. She reports increased pain in the hip and leg.   MEDICAL HISTORY:  Past Medical History:  Diagnosis Date   ADD (attention deficit disorder)    CLL (chronic lymphocytic leukemia) (HCC)    Dyslipidemia    Endometriosis    Hematuria    h/o asymptomatic   HTN (hypertension)    PONV (postoperative nausea and vomiting)     SURGICAL HISTORY: Past Surgical History:  Procedure  Laterality Date   LAPAROSCOPY     endometriosis   RADIOACTIVE SEED GUIDED EXCISIONAL BREAST BIOPSY Left 09/13/2017   Procedure: RADIOACTIVE SEED GUIDED EXCISIONAL BREAST BIOPSY;  Surgeon: Emelia Loron, MD;  Location: [facility];  Service: General;  Laterality: Left;    SOCIAL HISTORY: Social History   Socioeconomic History   Marital status: Married    Spouse name: Not on file   Number of children: 3   Years of education: Not on file   Highest education level: Not on file  Occupational History   Occupation: Soil scientist  Tobacco Use   Smoking status: Never   Smokeless tobacco: Never  Vaping Use   Vaping status: Never Used  Substance and Sexual Activity   Alcohol use: Not Currently   Drug use: No   Sexual activity: Not Currently    Partners: Male    Birth control/protection: Post-menopausal    Comment: vasectomy  Other Topics Concern   Not on file  Social History Narrative   Not on file   Social Drivers of Health   Financial Resource Strain: Not on file  Food Insecurity: Not on file  Transportation Needs: Not on file  Physical Activity: Not on file  Stress: Not on file  Social Connections: Not on file  Intimate Partner Violence: Not on file    FAMILY HISTORY: Family History  Problem Relation Age of Onset   Heart failure Mother    Diabetes Father    Other Brother        bilateral bladder   Colon cancer Neg Hx     ALLERGIES:  is allergic to codeine, latex, and other.  MEDICATIONS:  Current Outpatient Medications  Medication Sig Dispense Refill   CALCIUM PO Take 600 mg by mouth daily. PATIENT NOT SURE OF DOSAGE     FLUoxetine (PROZAC) 20 MG tablet TAKE 1 TABLET(20 MG) BY MOUTH DAILY 90 tablet 2   hydrochlorothiazide (MICROZIDE) 12.5 MG capsule TAKE 1 CAPSULE(12.5 MG) BY MOUTH DAILY 90 capsule 3   IMBRUVICA 420 MG tablet TAKE 1 TABLET BY MOUTH ONCE  DAILY WITH A FULL GLASS OF WATER 28 tablet 5   Magnesium Gluconate (MAGNESIUM 27 PO)  Take by mouth.     metoprolol succinate (TOPROL-XL) 25 MG 24 hr tablet Take 1 tablet (25 mg total) by mouth daily. 90 tablet 3   Multiple Vitamin (MULTIVITAMIN) tablet Take 1 tablet by mouth daily.     potassium chloride (K-DUR) 10 MEQ tablet Take 10 mEq by mouth daily.     telmisartan (MICARDIS) 80 MG tablet TAKE 1 TABLET BY MOUTH DAILY 90 tablet 3   No current facility-administered medications for this visit.    REVIEW OF SYSTEMS:    10 Point review of Systems was done is negative except as noted above.   PHYSICAL EXAMINATION: ECOG PERFORMANCE STATUS: 1 - Symptomatic but completely ambulatory .BP (!) 129/90 (BP Location: Right Arm, Patient Position: Sitting)   Pulse 72   Temp (!) 97.5 F (36.4 C) (Tympanic)   Resp 18  Ht 5\' 2"  (1.575 m)   Wt 145 lb 8 oz (66 kg)   LMP 08/05/2007   SpO2 99%   BMI 26.61 kg/m   GENERAL:alert, in no acute distress and comfortable SKIN: no acute rashes, no significant lesions EYES: conjunctiva are pink and non-injected, sclera anicteric OROPHARYNX: MMM, no exudates, no oropharyngeal erythema or ulceration NECK: supple, no JVD LYMPH:  no palpable lymphadenopathy in the cervical, axillary or inguinal regions LUNGS: clear to auscultation b/l with normal respiratory effort HEART: regular rate & rhythm ABDOMEN:  normoactive bowel sounds , non tender, not distended. No palpable hepatosplenomegaly. Extremity: no pedal edema PSYCH: alert & oriented x 3 with fluent speech NEURO: no focal motor/sensory deficits   LABORATORY DATA:  I have reviewed the data as listed .    Latest Ref Rng & Units 10/19/2023    9:33 AM 08/01/2023    8:57 AM 05/30/2023    8:26 AM  CBC  WBC 4.0 - 10.5 K/uL 9.3  10.3  4.3   Hemoglobin 12.0 - 15.0 g/dL 16.1  09.6  04.5   Hematocrit 36.0 - 46.0 % 41.6  40.5  40.6   Platelets 150 - 400 K/uL 242  174  143    .    Latest Ref Rng & Units 10/19/2023    9:33 AM 08/01/2023    8:57 AM 05/30/2023    8:26 AM  CMP  Glucose 70  - 99 mg/dL 83  94  96   BUN 8 - 23 mg/dL 21  25  16    Creatinine 0.44 - 1.00 mg/dL 4.09  8.11  9.14   Sodium 135 - 145 mmol/L 142  140  140   Potassium 3.5 - 5.1 mmol/L 3.5  3.9  3.4   Chloride 98 - 111 mmol/L 105  103  102   CO2 22 - 32 mmol/L 31  32  31   Calcium 8.9 - 10.3 mg/dL 9.7  9.4  9.9   Total Protein 6.5 - 8.1 g/dL 6.6  6.2  6.2   Total Bilirubin 0.0 - 1.2 mg/dL 0.7  0.6  0.8   Alkaline Phos 38 - 126 U/L 57  65  69   AST 15 - 41 U/L 20  22  28    ALT 0 - 44 U/L 13  13  28     . Lab Results  Component Value Date   LDH 280 (H) 02/28/2019     RADIOGRAPHIC STUDIES: I have personally reviewed the radiological images as listed and agreed with the findings in the report. No results found.  ASSESSMENT & PLAN:   73 year old woman with:   1.  CLL diagnosed in 2018.  She presented with lymphocytosis and adenopathy and currently in remission. Current treatment is Ibrutinib.     2.  Autoimmune considerations: She has not experienced any autoimmune thrombocytopenia or acute hemolytic anemia.  Will continue to monitor.   3.  Hypokalemia.   4.  Arthritis of the hip: Continues to follow with orthopedics regarding this issue.  She is status post injection of the left hip with improvement in her pain. Planned for left THA  PLAN:  -Discussed lab results on 10/19/23 in detail with patient. CBC showed WBC of 9.3K, hemoglobin of 14.5, and platelets of 242K. -Hgb improved from 13.9 on 08/01/2023  to 14.5 currently -WBC and platelets normal -CMP normal -Her spleen and liver did not feel enlarged during physical examination  -Patient is tolerating Ibrutinib 420 mg well without any severe  toxicities.  -Patient will continue taking Ibrutinib 420 mg as prescribed.  -educated patient that long and warm showers can trigger skin rashes. Recommend taking shorter showers and staying well-moisturized to improve skin rashes.  -recommend using OTC hydrocortisone cream to address eczematoid changes  and improve skin rashes.  -patient was not noted to have a discreet lesion near the left collarbone upon physical examination  -discussed that certain red spots were likely related to folliculitis and others were likely prominent blood vessels in the skin -patient is appropriate to proceed with upcoming hip surgery. She should hold Ibrutinib for 7-10 days prior to surgery then restarting the medication 1 week after surgery given that there is no concern for bleeding. After her hip surgery, I would recommend being on blood thinners such as prophylactic dose of Eliquis/xarelto for 4-6 weeks after the procedure to prevent blood clots. Also recommend early mobilization soon after surgery. -preoperative hematology assessment form filled and faxed to surgeons office. -answered all of patient's questions in detail -will continue to monitor with labs in 3 months -continue to follow up with dermatologist once a year given that CLL does increase the risk of certain skin cancers  FOLLOW-UP: RTC with Dr Candise Che with labs in 3 months  The total time spent in the appointment was 32 minutes* .  All of the patient's questions were answered with apparent satisfaction. The patient knows to call the clinic with any problems, questions or concerns.   Wyvonnia Lora MD MS AAHIVMS [facility] Hematology/Oncology Physician [facility]  .*Total Encounter Time as defined by the Centers for Medicare and Medicaid Services includes, in addition to the face-to-face time of a patient visit (documented in the note above) non-face-to-face time: obtaining and reviewing outside history, ordering and reviewing medications, tests or procedures, care coordination (communications with other health care professionals or caregivers) and documentation in the medical record.    I,Mitra Faeizi,acting as a Neurosurgeon for Wyvonnia Lora, MD.,have documented all relevant documentation on the behalf of Wyvonnia Lora, MD,as directed by  Wyvonnia Lora, MD while in the presence of Wyvonnia Lora, MD.  .I have reviewed the above documentation for accuracy and completeness, and I agree with the above. Johney Maine MD

## 2023-10-25 ENCOUNTER — Telehealth: Payer: Self-pay

## 2023-10-25 NOTE — Telephone Encounter (Signed)
   Pre-operative Risk Assessment    Patient Name: Alice Pollard  DOB: [birth year] MRN: [id number]   Date of last office visit: 12/20/22 DR [place] Date of next office visit: 12/21/23 DR [place]   Request for Surgical Clearance    Procedure:   LEFT TOTAL HIP ARTHROPLASTY  Date of Surgery:  Clearance 11/17/23                                Surgeon:  DR Durene Romans Surgeon's Group or Practice Name:  [facility] Phone number:  [phone] Fax number:  [phone]   Type of Clearance Requested:   - Medical    Type of Anesthesia:  Spinal   Additional requests/questions:    Signed, Treyshaun Keatts   10/25/2023, 4:44 PM

## 2023-10-26 ENCOUNTER — Telehealth: Payer: Self-pay | Admitting: *Deleted

## 2023-10-26 NOTE — Telephone Encounter (Signed)
Pt has been scheduled tele preop appt 11/08/23. Med rec and consent are done.      Patient Consent for Virtual Visit        Alice Pollard has provided verbal consent on 10/26/2023 for a virtual visit (video or telephone).   CONSENT FOR VIRTUAL VISIT FOR:  Alice Pollard  By participating in this virtual visit I agree to the following:  I hereby voluntarily request, consent and authorize [facility] and its employed or contracted physicians, physician assistants, nurse practitioners or other licensed health care professionals (the Practitioner), to provide me with telemedicine health care services (the "Services") as deemed necessary by the treating Practitioner. I acknowledge and consent to receive the Services by the Practitioner via telemedicine. I understand that the telemedicine visit will involve communicating with the Practitioner through live audiovisual communication technology and the disclosure of certain medical information by electronic transmission. I acknowledge that I have been given the opportunity to request an in-person assessment or other available alternative prior to the telemedicine visit and am voluntarily participating in the telemedicine visit.  I understand that I have the right to withhold or withdraw my consent to the use of telemedicine in the course of my care at any time, without affecting my right to future care or treatment, and that the Practitioner or I may terminate the telemedicine visit at any time. I understand that I have the right to inspect all information obtained and/or recorded in the course of the telemedicine visit and may receive copies of available information for a reasonable fee.  I understand that some of the potential risks of receiving the Services via telemedicine include:  Delay or interruption in medical evaluation due to technological equipment failure or disruption; Information transmitted may not be sufficient (e.g. poor  resolution of images) to allow for appropriate medical decision making by the Practitioner; and/or  In rare instances, security protocols could fail, causing a breach of personal health information.  Furthermore, I acknowledge that it is my responsibility to provide information about my medical history, conditions and care that is complete and accurate to the best of my ability. I acknowledge that Practitioner's advice, recommendations, and/or decision may be based on factors not within their control, such as incomplete or inaccurate data provided by me or distortions of diagnostic images or specimens that may result from electronic transmissions. I understand that the practice of medicine is not an exact science and that Practitioner makes no warranties or guarantees regarding treatment outcomes. I acknowledge that a copy of this consent can be made available to me via my patient portal [facility] MyChart), or I can request a printed copy by calling the office of  [facility].    I understand that my insurance will be billed for this visit.   I have read or had this consent read to me. I understand the contents of this consent, which adequately explains the benefits and risks of the Services being provided via telemedicine.  I have been provided ample opportunity to ask questions regarding this consent and the Services and have had my questions answered to my satisfaction. I give my informed consent for the services to be provided through the use of telemedicine in my medical care

## 2023-10-26 NOTE — Telephone Encounter (Signed)
Pt has been scheduled tele preop appt 11/08/23. Med rec and consent are done.

## 2023-10-26 NOTE — Telephone Encounter (Signed)
   Name: Alice Pollard  DOB: [birth year]  MRN: [id number]  Primary Cardiologist: None   Preoperative team, please contact this patient and set up a phone call appointment for further preoperative risk assessment. Please obtain consent and complete medication review. Thank you for your help.  I confirm that guidance regarding antiplatelet and oral anticoagulation therapy has been completed and, if necessary, noted below.  N/A  I also confirmed the patient resides in the state of [place]. As per [facility] Medical Board telemedicine laws, the patient must reside in the state in which the provider is licensed.   Napoleon Form, Leodis Rains, NP 10/26/2023, 8:25 AM [facility]

## 2023-11-08 ENCOUNTER — Ambulatory Visit: Payer: 59 | Attending: Cardiology

## 2023-11-08 DIAGNOSIS — Z01810 Encounter for preprocedural cardiovascular examination: Secondary | ICD-10-CM

## 2023-11-08 NOTE — Progress Notes (Signed)
 Virtual Visit via Telephone Note   Because of Alice Pollard's co-morbid illnesses, she is at least at moderate risk for complications without adequate follow up.  This format is felt to be most appropriate for this patient at this time.  The patient did not have access to video technology/had technical difficulties with video requiring transitioning to audio format only (telephone).  All issues noted in this document were discussed and addressed.  No physical exam could be performed with this format.  Please refer to the patient's chart for her consent to telehealth for [facility].  Evaluation Performed:  Preoperative cardiovascular risk assessment _____________   Date:  11/08/2023   Patient ID:  Alice Pollard, DOB 1951/06/24, MRN [id number] Patient Location:  Home Provider location:   Office  Primary Care Provider:  Nichole Senior, MD Primary Cardiologist:  None  Chief Complaint / Patient Profile   73 y.o. y/o female with a h/o PVCs, hypertension who is pending LEFT TOTAL HIP ARTHROPLASTY and presents today for telephonic preoperative cardiovascular risk assessment.  History of Present Illness    Alice Pollard is a 73 y.o. female who presents via audio/video conferencing for a telehealth visit today.  Pt was last seen in cardiology clinic on 12/20/2022 by Dr. Jordan.  At that time Alice Pollard was doing well .  The patient is now pending procedure as outlined above. Since her last visit, she remained stable from a cardiac standpoint.  Today she denies chest pain, shortness of breath, lower extremity edema, fatigue, palpitations, melena, hematuria, hemoptysis, diaphoresis, weakness, presyncope, syncope, orthopnea, and PND.   Past Medical History    Past Medical History:  Diagnosis Date   ADD (attention deficit disorder)    CLL (chronic lymphocytic leukemia) (HCC)    Dyslipidemia    Endometriosis    Hematuria    h/o asymptomatic   HTN (hypertension)     PONV (postoperative nausea and vomiting)    Past Surgical History:  Procedure Laterality Date   LAPAROSCOPY     endometriosis   RADIOACTIVE SEED GUIDED EXCISIONAL BREAST BIOPSY Left 09/13/2017   Procedure: RADIOACTIVE SEED GUIDED EXCISIONAL BREAST BIOPSY;  Surgeon: Ebbie Cough, MD;  Location: [facility];  Service: General;  Laterality: Left;    Allergies  Allergies  Allergen Reactions   Codeine Other (See Comments)    Fainted    Latex Itching and Other (See Comments)    sensitivity   Other Other (See Comments)    Home Medications    Prior to Admission medications   Medication Sig Start Date End Date Taking? Authorizing Provider  CALCIUM  PO Take 600 mg by mouth daily. PATIENT NOT SURE OF DOSAGE    [provider]  FLUoxetine  (PROZAC ) 20 MG tablet TAKE 1 TABLET(20 MG) BY MOUTH DAILY Patient not taking: Reported on 10/26/2023 09/19/18   Cleotilde Ronal RAMAN, MD  hydrochlorothiazide  (MICROZIDE ) 12.5 MG capsule TAKE 1 CAPSULE(12.5 MG) BY MOUTH DAILY 03/14/23   Jordan, Peter M, MD  IMBRUVICA  420 MG tablet TAKE 1 TABLET BY MOUTH ONCE  DAILY WITH A FULL GLASS OF WATER 07/14/23   Onesimo Emaline Brink, MD  Magnesium Gluconate (MAGNESIUM 27 PO) Take by mouth.    [provider]  metoprolol  succinate (TOPROL -XL) 25 MG 24 hr tablet Take 1 tablet (25 mg total) by mouth daily. 12/20/22   Jordan, Peter M, MD  Multiple Vitamin (MULTIVITAMIN) tablet Take 1 tablet by mouth daily.    [provider]  potassium  chloride (K-DUR) 10 MEQ tablet Take 10 mEq by mouth daily.    [provider]  telmisartan  (MICARDIS ) 80 MG tablet TAKE 1 TABLET BY MOUTH DAILY 11/19/22   Jordan, Peter M, MD    Physical Exam    Vital Signs:  Alice Pollard does not have vital signs available for review today.  Given telephonic nature of communication, physical exam is limited. AAOx3. NAD. Normal affect.  Speech and respirations are unlabored.  Accessory Clinical  Findings    None  Assessment & Plan    1.  Preoperative Cardiovascular Risk Assessment:Procedure:   LEFT TOTAL HIP ARTHROPLASTY   Date of Surgery:  Clearance 11/17/23                                  Surgeon:  DR DONNICE CAR Surgeon's Group or Practice Name:  [facility] Phone number:  [phone] Fax number:  [phone]      Primary Cardiologist: Dr. Jordan  Chart reviewed as part of pre-operative protocol coverage. Given past medical history and time since last visit, based on ACC/AHA guidelines, Alice Pollard would be at acceptable risk for the planned procedure without further cardiovascular testing.   Her RCRI is very low risk, 0.4% risk of major cardiac event.  She is able to complete greater than 4 METS of physical activity.  Patient was advised that if she develops new symptoms prior to surgery to contact our office to arrange a follow-up appointment.  He verbalized understanding.  I will route this recommendation to the requesting party via Epic fax function and remove from pre-op pool.       Time:   Today, I have spent 6 minutes with the patient with telehealth technology discussing medical history, symptoms, and management plan.     Alice CHRISTELLA Beauvais, NP  11/08/2023, 7:12 AM

## 2023-12-16 NOTE — Progress Notes (Unsigned)
 Cardiology Office Note:    Date:  12/21/2023   ID:  Rudene Re, DOB 1951/02/24, MRN [id number]  PCP:  Adrian Prince, MD  Cardiologist:  Dr. Aaronjames Kelsay [place]   Electrophysiologist:  n/a  Referring MD: Adrian Prince, MD   Chief Complaint  Patient presents with   Shortness of Breath    History of Present Illness:    Alice Pollard is a 73 y.o. female with a hx of HTN, HL, PVCs.   Her ECG demonstrated asymptomatic PVCs in a bigeminal pattern. Echocardiogram demonstrated normal LV function. Patient was placed on Metoprolol tartrate 12.5 bid.    Seen by Tereso Newcomer PAC in July 2017.  She never started the Metoprolol. This was resumed and a follow up Holter done in August. This showed PACs and PVCs with low arrhythmia burden with only 452 PVCs in 24 hours.   She has been diagnosed with stage 1 CLL and is followed by oncology. On Imbruvica. Is planning to have THR.   No palpitations. CLL is well controlled. No edema.   She did have left THR 3 week ago by Dr Charlann Boxer. 2 night ago she was seen in ED for some right anterior pleuritic chest pain and SOB. CXR, Ecg and troponin were OK. Did not check D dimer or CT chest. Notes she had persistent symptoms last night and some SOB today. Apparently was on some blood thinner post op for 2 weeks but not sure what.   Past Medical History:  Diagnosis Date   ADD (attention deficit disorder)    CLL (chronic lymphocytic leukemia) (HCC)    Dyslipidemia    Endometriosis    Hematuria    h/o asymptomatic   HTN (hypertension)    PONV (postoperative nausea and vomiting)     Past Surgical History:  Procedure Laterality Date   LAPAROSCOPY     endometriosis   RADIOACTIVE SEED GUIDED EXCISIONAL BREAST BIOPSY Left 09/13/2017   Procedure: RADIOACTIVE SEED GUIDED EXCISIONAL BREAST BIOPSY;  Surgeon: Emelia Loron, MD;  Location:  SUR[facility]ERY C[facility];  Service: General;  Laterality: Left;   TOTAL HIP ARTHROPLASTY Left     Current  Medications: Outpatient Medications Prior to Visit  Medication Sig Dispense Refill   CALCIUM PO Take 600 mg by mouth daily. PATIENT NOT SURE OF DOSAGE     FLUoxetine (PROZAC) 20 MG tablet TAKE 1 TABLET(20 MG) BY MOUTH DAILY 90 tablet 2   hydrochlorothiazide (MICROZIDE) 12.5 MG capsule TAKE 1 CAPSULE(12.5 MG) BY MOUTH DAILY 90 capsule 3   IMBRUVICA 420 MG tablet TAKE 1 TABLET BY MOUTH ONCE  DAILY WITH A FULL GLASS OF WATER 28 tablet 5   Magnesium Gluconate (MAGNESIUM 27 PO) Take by mouth.     metoprolol succinate (TOPROL-XL) 25 MG 24 hr tablet Take 1 tablet (25 mg total) by mouth daily. 90 tablet 3   Multiple Vitamin (MULTIVITAMIN) tablet Take 1 tablet by mouth daily.     telmisartan (MICARDIS) 80 MG tablet TAKE 1 TABLET BY MOUTH DAILY 90 tablet 3   potassium chloride (K-DUR) 10 MEQ tablet Take 10 mEq by mouth daily.     No facility-administered medications prior to visit.      Allergies:   Codeine, Latex, and Other   Social History   Socioeconomic History   Marital status: Married    Spouse name: Not on file   Number of children: 3   Years of education: Not on file   Highest education level: Not on file  Occupational History  Occupation: Soil scientist  Tobacco Use   Smoking status: Never   Smokeless tobacco: Never  Vaping Use   Vaping status: Never Used  Substance and Sexual Activity   Alcohol use: Not Currently   Drug use: No   Sexual activity: Not Currently    Partners: Male    Birth control/protection: Post-menopausal    Comment: vasectomy  Other Topics Concern   Not on file  Social History Narrative   Not on file   Social Drivers of Health   Financial Resource Strain: Not on file  Food Insecurity: Not on file  Transportation Needs: Not on file  Physical Activity: Not on file  Stress: Not on file  Social Connections: Not on file     Family History:  The patient's family history includes Diabetes in her father; Heart failure in her mother; Other in her  brother.   ROS:   Please see the history of present illness.    ROS All other systems reviewed and are negative.   Physical Exam:    VS:  BP 117/76   Pulse 72   Ht 5\' 2"  (1.575 m)   Wt 138 lb (62.6 kg)   LMP 08/05/2007   SpO2 99%   BMI 25.24 kg/m     Wt Readings from Last 3 Encounters:  12/21/23 138 lb (62.6 kg)  12/19/23 145 lb (65.8 kg)  10/19/23 145 lb 8 oz (66 kg)    GENERAL:  Well appearing WF in NAD HEENT:  PERRL, EOMI, sclera are clear. Oropharynx is clear. NECK:  No jugular venous distention, carotid upstroke brisk and symmetric, no bruits, no thyromegaly or adenopathy LUNGS:  Clear to auscultation bilaterally CHEST:  Unremarkable HEART:  RRR  PMI not displaced or sustained,S1 and S2 within normal limits, no S3, no S4: no clicks, no rubs, no murmurs ABD:  Soft, nontender. BS +, no masses or bruits. No hepatomegaly, no splenomegaly EXT:  2 + pulses throughout, no edema, no cyanosis no clubbing SKIN:  Warm and dry.  No rashes NEURO:  Alert and oriented x 3. Cranial nerves II through XII intact. PSYCH:  Cognitively intact     Studies/Labs Reviewed:   EKG Interpretation Date/Time:  Wednesday December 21 2023 09:24:02 EDT Ventricular Rate:  72 PR Interval:  172 QRS Duration:  72 QT Interval:  368 QTC Calculation: 402 R Axis:   26  Text Interpretation: Normal sinus rhythm Nonspecific T wave abnormality When compared with ECG of 19-Dec-2023 17:28, Non-specific change in ST segment in Inferior leads Nonspecific T wave abnormality has replaced inverted T waves in Inferior leads Confirmed by [place], Rosalynn Sergent [phone]) on 12/21/2023 9:30:52 AM    Recent Labs: 10/19/2023: ALT 13 12/19/2023: BUN 9; Creatinine, Ser 0.76; Hemoglobin 12.0; Platelets 187; Potassium 2.9; Sodium 141   Recent Lipid Panel    Component Value Date/Time   CHOL 225 (H) 04/16/2016 1002   TRIG 257 (H) 04/16/2016 1002   HDL 56 04/16/2016 1002   CHOLHDL 4.0 04/16/2016 1002   VLDL 51 (H) 04/16/2016 1002    LDLCALC 118 04/16/2016 1002   LDLDIRECT 121.6 01/15/2013 0915   Labs dated 01/16/18: cholesterol 305, triglycerides 461, HDL 56, LDL 157.  Dated 04/01/21: 248, triglycerides 209, HDL 87, LDL 119. Non HDL 161.  Dated 03/31/22: cholesterol 244, triglycerides 153, HDL 64, LDL 149. Dated 04/20/23: cholesterol 260, triglycerides 146, HDL 82, LDL 149.   Additional studies/ records that were reviewed today include:   Echo 04/16/16 Vigorous LVEF, EF 65-70%, normal wall  motion, grade 1 diastolic dysfunction, trivial MR  ETT-Echo 3/08 Normal   Holter monitor 05/25/16 showed occ. PVCs and rare PACs. Arrhythmia burden was quite low with total PVCs of only 452.  ASSESSMENT:    1. Shortness of breath   2. Essential hypertension   3. PVC (premature ventricular contraction)     PLAN:    In order of problems listed above:  1. Shortness of breath and some pleuritic chest pain. Given that this is 3 weeks post op hip replacement I think we need to evaluate for possible pulmonary embolus. Will order stat chest CT. 2. PVCs - by prior Holter monitor she had low burden on metoprolol.  She is asymptomatic. EF is normal on Echo 2017.  Need to replete potassium.   2. HTN - BP is well controlled on HCTZ, metoprolol, ARB. Recent potassium was 2.9 and she has not been taking her supplement. Will resume potassium 20 meq daily.  If CT ok will follow up in one year.     Medication Adjustments/Labs and Tests Ordered: Current medicines are reviewed at length with the patient today.  Concerns regarding medicines are outlined above.  Medication changes, Labs and Tests ordered today are outlined in the Patient Instructions noted below. There are no Patient Instructions on file for this visit.     Signed, Leana Springston [place], MD  12/21/2023 9:43 AM    [facility] [address] [place], [place], [place]  [address] Phone: [phone]; Fax: [phone]

## 2023-12-19 ENCOUNTER — Encounter (HOSPITAL_COMMUNITY): Payer: Self-pay

## 2023-12-19 ENCOUNTER — Telehealth: Payer: Self-pay | Admitting: Cardiology

## 2023-12-19 ENCOUNTER — Emergency Department (HOSPITAL_COMMUNITY)

## 2023-12-19 ENCOUNTER — Emergency Department (HOSPITAL_COMMUNITY)
Admission: EM | Admit: 2023-12-19 | Discharge: 2023-12-20 | Discharge status: Against Medical Advice | Attending: Emergency Medicine | Admitting: Emergency Medicine

## 2023-12-19 DIAGNOSIS — Z5321 Procedure and treatment not carried out due to patient leaving prior to being seen by health care provider: Secondary | ICD-10-CM | POA: Diagnosis not present

## 2023-12-19 DIAGNOSIS — R0602 Shortness of breath: Secondary | ICD-10-CM | POA: Diagnosis present

## 2023-12-19 LAB — BASIC METABOLIC PANEL
Anion gap: 11 (ref 5–15)
BUN: 9 mg/dL (ref 8–23)
CO2: 24 mmol/L (ref 22–32)
Calcium: 9.6 mg/dL (ref 8.9–10.3)
Chloride: 106 mmol/L (ref 98–111)
Creatinine, Ser: 0.76 mg/dL (ref 0.44–1.00)
GFR, Estimated: >60 mL/min (ref >60)
Glucose, Bld: 95 mg/dL (ref 70–99)
Potassium: 2.9 mmol/L — ABNORMAL LOW (ref 3.5–5.1)
Sodium: 141 mmol/L (ref 135–145)

## 2023-12-19 LAB — TROPONIN I (HIGH SENSITIVITY): Troponin I (High Sensitivity): 6 ng/L (ref <18)

## 2023-12-19 LAB — CBC
HCT: 35.8 % — ABNORMAL LOW (ref 36.0–46.0)
Hemoglobin: 12 g/dL (ref 12.0–15.0)
MCH: 31.7 pg (ref 26.0–34.0)
MCHC: 33.5 g/dL (ref 30.0–36.0)
MCV: 94.5 fL (ref 80.0–100.0)
Platelets: 187 10*3/uL (ref 150–400)
RBC: 3.79 MIL/uL — ABNORMAL LOW (ref 3.87–5.11)
RDW: 13.6 % (ref 11.5–15.5)
WBC: 12.7 10*3/uL — ABNORMAL HIGH (ref 4.0–10.5)
nRBC: 0 % (ref 0.0–0.2)

## 2023-12-19 NOTE — ED Provider Triage Note (Signed)
 Emergency Medicine Provider Triage Evaluation Note  Alice Pollard , a 73 y.o. female  was evaluated in triage.  Pt complains of shob for last 2 days.  Denies any chest pain, nausea, vomiting, diarrhea.  She did have a hip replacement last month.  Was taking a blood thinner until around 2 weeks ago.  Denies any unilateral leg swelling.  Denies any hemoptysis.   Review of Systems  Positive: shob Negative: Chest pains  Physical Exam  BP (!) 143/103 (BP Location: Right Arm)   Pulse 87   Temp 98 F (36.7 C)   Resp 17   Ht 5\' 2"  (1.575 m)   Wt 65.8 kg   LMP 08/05/2007   SpO2 100%   BMI 26.52 kg/m  Gen:   Awake, no distress   Resp:  Normal effort  MSK:   Moves extremities without difficulty  Other:  Normal lung sounds bilaterally, no unilateral calf swelling.  Medical Decision Making  Medically screening exam initiated at 5:50 PM.  Appropriate orders placed.  Rudene Re was informed that the remainder of the evaluation will be completed by another provider, this initial triage assessment does not replace that evaluation, and the importance of remaining in the ED until their evaluation is complete.  Workup initiated in triage    Olene Floss, [place] 12/19/23 1753

## 2023-12-19 NOTE — ED Triage Notes (Signed)
 Pt c/o intermittent SOB starting yesterday.  Denies pain and n/v/d.  Pt reports intermittent difficulty catching her breath.  Pt easily speaking full sentences.   Pt had hip replacement last month.  Pt reports taking a blood thinner until around 2 weeks ago.

## 2023-12-19 NOTE — Telephone Encounter (Signed)
 Pt c/o Shortness Of Breath: STAT if SOB developed within the last 24 hours or pt is noticeably SOB on the phone  1. Are you currently SOB (can you hear that pt is SOB on the phone)? no  2. How long have you been experiencing SOB? today  3. Are you SOB when sitting or when up moving around? Both   4. Are you currently experiencing any other symptoms? no

## 2023-12-19 NOTE — Telephone Encounter (Signed)
 Patient identification verified by 2 forms. Shade Flood, RN     Called and spoke to patient  Patient states:  - recovering from hip surgery  - SOB for past couple of days - Relieved with rest sometimes  - SOB is new symptom  Patient denies:  - CP, dizziness, blurred vision, one-sided weakness, headache - hx of SOB              Interventions/Plan: - Patient audibly short of breath during call. Recommended patient go to ER to be evaluated.    Reviewed ED warning signs/precautions  Patient agrees with plan, no questions at this time

## 2023-12-19 NOTE — ED Notes (Signed)
 Called twice for 2nd trop no answer

## 2023-12-20 NOTE — ED Notes (Signed)
 Patient was called several times, but no answer.

## 2023-12-21 ENCOUNTER — Ambulatory Visit (INDEPENDENT_AMBULATORY_CARE_PROVIDER_SITE_OTHER): Payer: 59 | Admitting: Cardiology

## 2023-12-21 ENCOUNTER — Encounter: Payer: Self-pay | Admitting: Cardiology

## 2023-12-21 ENCOUNTER — Ambulatory Visit
Admission: RE | Admit: 2023-12-21 | Discharge: 2023-12-21 | Disposition: A | Discharge status: Home / Self Care | Source: Ambulatory Visit | Attending: Cardiology | Admitting: Cardiology

## 2023-12-21 ENCOUNTER — Telehealth: Payer: Self-pay | Admitting: Cardiology

## 2023-12-21 VITALS — BP 117/76 | HR 72 | Ht 62.0 in | Wt 138.0 lb

## 2023-12-21 DIAGNOSIS — R0602 Shortness of breath: Secondary | ICD-10-CM | POA: Insufficient documentation

## 2023-12-21 DIAGNOSIS — I10 Essential (primary) hypertension: Secondary | ICD-10-CM | POA: Diagnosis not present

## 2023-12-21 DIAGNOSIS — I493 Ventricular premature depolarization: Secondary | ICD-10-CM | POA: Insufficient documentation

## 2023-12-21 MED ORDER — IOHEXOL 350 MG/ML SOLN
100.0000 mL | Freq: Once | INTRAVENOUS | Status: AC | PRN
  Administered 2023-12-21: 75 mL via INTRAVENOUS

## 2023-12-21 MED ORDER — POTASSIUM CHLORIDE CRYS ER 20 MEQ PO TBCR: 20.0000 meq | EXTENDED_RELEASE_TABLET | Freq: Every day | ORAL | 3 refills | Status: DC

## 2023-12-21 NOTE — Telephone Encounter (Signed)
 Patient requests a call back when CT reuslts are available.

## 2023-12-21 NOTE — Patient Instructions (Addendum)
 Medication Instructions:  Continue same medications *If you need a refill on your cardiac medications before your next appointment, please call your pharmacy*   Lab Work: None ordered   Testing/Procedures: Stat Chest CT   Now at [facility]    Follow-Up: At [facility], you and your health needs are our priority.  As part of our continuing mission to provide you with exceptional heart care, we have created designated Provider Care Teams.  These Care Teams include your primary Cardiologist (physician) and Advanced Practice Providers (APPs -  Physician Assistants and Nurse Practitioners) who all work together to provide you with the care you need, when you need it.  We recommend signing up for the patient portal called "MyChart".  Sign up information is provided on this After Visit Summary.  MyChart is used to connect with patients for Virtual Visits (Telemedicine).  Patients are able to view lab/test results, encounter notes, upcoming appointments, etc.  Non-urgent messages can be sent to your provider as well.   To learn more about what you can do with MyChart, go to [email or web address].     Your next appointment:  1 year  Call in Dec to schedule March appointment     Provider:  Dr. [place]

## 2023-12-21 NOTE — Telephone Encounter (Signed)
 Spoke to patient advised Dr.Jordan is out of office this afternoon.Advised chest ct did not reveal a PE.Advised I will call back after Dr.Jordan reviews results.

## 2023-12-30 ENCOUNTER — Telehealth: Payer: Self-pay | Admitting: Cardiology

## 2023-12-30 NOTE — Telephone Encounter (Signed)
 Called and spoke to pt.  She continues to have the same SOB that she was c/o when at her office visit on 12/21/2023 with Dr. [place].  Stat CT did not reveal PE. She denies any other symptoms.  States the SOB comes on suddenly.  She has an appt with her PCP (Dr. Evlyn Kanner) on 01/02/2024.  I advised her to call us after her PCP visit and if we had any further recommendations we would reach out to her.  ED precautions reviewed; pt verbalized understanding.

## 2023-12-30 NOTE — Telephone Encounter (Signed)
 Pt c/o Shortness Of Breath: STAT if SOB developed within the last 24 hours or pt is noticeably SOB on the phone  1. Are you currently SOB (can you hear that pt is SOB on the phone)?  No, not during the day  2. How long have you been experiencing SOB?   March 19th (day of last visit)  3. Are you SOB when sitting or when up moving around?   Sitting  4. Are you currently experiencing any other symptoms? No   Patient stated she has SOB when she is going to bed and feels as though she can't catch her breath.

## 2023-12-31 ENCOUNTER — Other Ambulatory Visit: Payer: Self-pay | Admitting: Cardiology

## 2024-01-04 NOTE — Telephone Encounter (Signed)
 Spoke to patient she stated her sob is much better.She did see Dr.South 01/02/24.Advised to keep appointment with Dr.Jordan as planned and call sooner if needed.

## 2024-01-10 ENCOUNTER — Other Ambulatory Visit: Payer: Self-pay

## 2024-01-10 DIAGNOSIS — C9110 Chronic lymphocytic leukemia of B-cell type not having achieved remission: Secondary | ICD-10-CM

## 2024-01-11 ENCOUNTER — Inpatient Hospital Stay: Payer: 59

## 2024-01-11 ENCOUNTER — Inpatient Hospital Stay: Payer: 59 | Attending: Hematology | Admitting: Hematology

## 2024-01-11 VITALS — BP 120/79 | HR 63 | Temp 97.9°F | Resp 18 | Ht 62.0 in | Wt 133.8 lb

## 2024-01-11 DIAGNOSIS — I10 Essential (primary) hypertension: Secondary | ICD-10-CM | POA: Insufficient documentation

## 2024-01-11 DIAGNOSIS — E785 Hyperlipidemia, unspecified: Secondary | ICD-10-CM | POA: Diagnosis not present

## 2024-01-11 DIAGNOSIS — C9111 Chronic lymphocytic leukemia of B-cell type in remission: Secondary | ICD-10-CM | POA: Insufficient documentation

## 2024-01-11 DIAGNOSIS — D242 Benign neoplasm of left breast: Secondary | ICD-10-CM | POA: Diagnosis not present

## 2024-01-11 DIAGNOSIS — R21 Rash and other nonspecific skin eruption: Secondary | ICD-10-CM | POA: Insufficient documentation

## 2024-01-11 DIAGNOSIS — E876 Hypokalemia: Secondary | ICD-10-CM | POA: Diagnosis not present

## 2024-01-11 DIAGNOSIS — C9110 Chronic lymphocytic leukemia of B-cell type not having achieved remission: Secondary | ICD-10-CM | POA: Diagnosis not present

## 2024-01-11 DIAGNOSIS — Z79899 Other long term (current) drug therapy: Secondary | ICD-10-CM | POA: Diagnosis not present

## 2024-01-11 DIAGNOSIS — M129 Arthropathy, unspecified: Secondary | ICD-10-CM | POA: Insufficient documentation

## 2024-01-11 DIAGNOSIS — I700 Atherosclerosis of aorta: Secondary | ICD-10-CM | POA: Diagnosis not present

## 2024-01-11 LAB — CMP (CANCER CENTER ONLY)
ALT: 11 U/L (ref 0–44)
AST: 16 U/L (ref 15–41)
Albumin: 4.4 g/dL (ref 3.5–5.0)
Alkaline Phosphatase: 69 U/L (ref 38–126)
Anion gap: 9 (ref 5–15)
BUN: 21 mg/dL (ref 8–23)
CO2: 30 mmol/L (ref 22–32)
Calcium: 9.8 mg/dL (ref 8.9–10.3)
Chloride: 105 mmol/L (ref 98–111)
Creatinine: 0.92 mg/dL (ref 0.44–1.00)
GFR, Estimated: >60 mL/min (ref >60)
Glucose, Bld: 71 mg/dL (ref 70–99)
Potassium: 3.3 mmol/L — ABNORMAL LOW (ref 3.5–5.1)
Sodium: 144 mmol/L (ref 135–145)
Total Bilirubin: 0.6 mg/dL (ref 0.0–1.2)
Total Protein: 6.5 g/dL (ref 6.5–8.1)

## 2024-01-11 LAB — CBC WITH DIFFERENTIAL (CANCER CENTER ONLY)
Abs Immature Granulocytes: 0.03 10*3/uL (ref 0.00–0.07)
Basophils Absolute: 0.1 10*3/uL (ref 0.0–0.1)
Basophils Relative: 0 %
Eosinophils Absolute: 0 10*3/uL (ref 0.0–0.5)
Eosinophils Relative: 0 %
HCT: 40.6 % (ref 36.0–46.0)
Hemoglobin: 13.8 g/dL (ref 12.0–15.0)
Immature Granulocytes: 0 %
Lymphocytes Relative: 48 %
Lymphs Abs: 6 10*3/uL — ABNORMAL HIGH (ref 0.7–4.0)
MCH: 31.3 pg (ref 26.0–34.0)
MCHC: 34 g/dL (ref 30.0–36.0)
MCV: 92.1 fL (ref 80.0–100.0)
Monocytes Absolute: 0.8 10*3/uL (ref 0.1–1.0)
Monocytes Relative: 6 %
Neutro Abs: 5.8 10*3/uL (ref 1.7–7.7)
Neutrophils Relative %: 46 %
Platelet Count: 274 10*3/uL (ref 150–400)
RBC: 4.41 MIL/uL (ref 3.87–5.11)
RDW: 13.2 % (ref 11.5–15.5)
WBC Count: 12.7 10*3/uL — ABNORMAL HIGH (ref 4.0–10.5)
nRBC: 0 % (ref 0.0–0.2)

## 2024-01-11 MED ORDER — IBRUTINIB 420 MG PO TABS: 420.0000 mg | ORAL_TABLET | Freq: Every day | ORAL | 5 refills | Status: DC

## 2024-01-11 MED ORDER — POTASSIUM CHLORIDE CRYS ER 20 MEQ PO TBCR: 20.0000 meq | EXTENDED_RELEASE_TABLET | Freq: Every day | ORAL | 0 refills | Status: AC

## 2024-01-11 NOTE — Progress Notes (Signed)
 HEMATOLOGY/ONCOLOGY CLINIC NOTE  Date of Service: 01/11/2024  Patient Care Team: Rosslyn Coons, MD as PCP - General (Endocrinology)  CHIEF COMPLAINTS/PURPOSE OF CONSULTATION:  CLL (chronic lymphocytic leukemia) (HCC)   HISTORY OF PRESENTING ILLNESS:  Alice Pollard is a wonderful 73 y.o. female who is here for continued evaluation and management of CLL. Patient was following up with Dr. Dirk Fredericks.   Patient was diagnosed with CLL with lymphocytosis and adenopathy in 2018. Her secondary diagnosis was Fibroadenoma of the left breast diagnosed in 2018, which she had surgical resection and no evidence of relapse after the resection.   Patient's current treatment for CLL is Ibrutinib 420 mg, which was started in December 2020. Her treatment was temporarily withheld from April 2023 to July 2023 because she was diagnosed with hip arthritis and patient thought the joint pain was due to Ibritinib.   Patient's last visit with Dr. Dirk Fredericks was on 08/27/2022 and she was doing well overall. She noted that her hip pain improved after steroid injection from her orthopedic physician.   Patient reports she has been doing well without any new medical concerns since her last visit with Dr. Dirk Fredericks. She is tolerating her Ibritinib 420 mg without any severe toxicities. She does report occasional bruising due to Ibritinib.   Patient notes she previously had skin rashes near her upper back. She went to the dermatologist but could not figure out the reason for skin rash. Patient has not had any other skin rashes since then. She was on Ibritinib when she had the skin rashes.   She denies new lumps/bumps, infection issues, fever, chills, night sweats, shortness of breath, chest pain, abnormal bowel movement, abnormal bleeding issues, or leg swelling. She occasionally takes tylenol for pain management. She has degenerative disk issue.    INTERVAL HISTORY: Alice Pollard is a wonderful 73 y.o. female who is here  for continued evaluation and management of CLL.   Patient was last seen by me on 10/19/2023 and reported a fall, causing injury to her finger and mild bruise in the right upper extremity. She also reported persistent rash near the left collarbone present for 5-10 years, attributed to psoriatic arthritis, as well as rash on her leg for a while, face, and her back. Patient also reported arthritis in her hip with increased pain in the hip and leg.   Patient had left hip replacement on February 13th, 2025.   Today, she is accompanied by her husband. Patient reports that she has been overall recovering well since her surgery. Patient reports that it was not felt that she required physical therapy.   Patient reports some mild aching with increased walking at this time. She reports endorsing some mild pain this morning.    Patient followed up with her orthopedic surgeon and she has no limitation or restriction in regards to range of her movement. She is able to climb the stairs well with no issues. Her husband reports that patient lacks stamina.   She reports that her surgical scar has healed and denies any issues with delayed healing.   Patient restarted ibrutinib after her surgery. She is tolerating Ibrutinib well with no new or major toxicities.   She reports that the first two weeks following hip surgery were miserable due to pain. At the time of her 2-week follow-up, patient was "barely" able to move around and did use a walker. She was started on a steroid which caused significant improvement.   Her recovery process continuously progressed  since then. At the 4-week post-surgery mark, she was using a walker in the house occasionally. Patient has graduated use of a walker at this time.   Patient denies any new lumps/bumps, fever, chills, night sweats, abdominal pain, change in bowel habits, or leg swelling.  Patient reports that she has run out of potassium replacement. She was noted to be on 20  MEQ potassium once daily.   Her husband reports that there are plans to travel to [place] in August 2026.   MEDICAL HISTORY:  Past Medical History:  Diagnosis Date   ADD (attention deficit disorder)    CLL (chronic lymphocytic leukemia) (HCC)    Dyslipidemia    Endometriosis    Hematuria    h/o asymptomatic   HTN (hypertension)    PONV (postoperative nausea and vomiting)     SURGICAL HISTORY: Past Surgical History:  Procedure Laterality Date   LAPAROSCOPY     endometriosis   RADIOACTIVE SEED GUIDED EXCISIONAL BREAST BIOPSY Left 09/13/2017   Procedure: RADIOACTIVE SEED GUIDED EXCISIONAL BREAST BIOPSY;  Surgeon: Enid Harry, MD;  Location: [facility];  Service: General;  Laterality: Left;   TOTAL HIP ARTHROPLASTY Left     SOCIAL HISTORY: Social History   Socioeconomic History   Marital status: Married    Spouse name: Not on file   Number of children: 3   Years of education: Not on file   Highest education level: Not on file  Occupational History   Occupation: Soil scientist  Tobacco Use   Smoking status: Never   Smokeless tobacco: Never  Vaping Use   Vaping status: Never Used  Substance and Sexual Activity   Alcohol use: Not Currently   Drug use: No   Sexual activity: Not Currently    Partners: Male    Birth control/protection: Post-menopausal    Comment: vasectomy  Other Topics Concern   Not on file  Social History Narrative   Not on file   Social Drivers of Health   Financial Resource Strain: Not on file  Food Insecurity: Not on file  Transportation Needs: Not on file  Physical Activity: Not on file  Stress: Not on file  Social Connections: Not on file  Intimate Partner Violence: Not on file    FAMILY HISTORY: Family History  Problem Relation Age of Onset   Heart failure Mother    Diabetes Father    Other Brother        bilateral bladder   Colon cancer Neg Hx     ALLERGIES:  is allergic to codeine, latex, and  other.  MEDICATIONS:  Current Outpatient Medications  Medication Sig Dispense Refill   CALCIUM PO Take 600 mg by mouth daily. PATIENT NOT SURE OF DOSAGE     FLUoxetine (PROZAC) 20 MG tablet TAKE 1 TABLET(20 MG) BY MOUTH DAILY 90 tablet 2   hydrochlorothiazide (MICROZIDE) 12.5 MG capsule TAKE 1 CAPSULE(12.5 MG) BY MOUTH DAILY 90 capsule 3   IMBRUVICA 420 MG tablet TAKE 1 TABLET BY MOUTH ONCE  DAILY WITH A FULL GLASS OF WATER 28 tablet 5   Magnesium Gluconate (MAGNESIUM 27 PO) Take by mouth.     metoprolol succinate (TOPROL-XL) 25 MG 24 hr tablet TAKE 1 TABLET(25 MG) BY MOUTH DAILY 90 tablet 3   Multiple Vitamin (MULTIVITAMIN) tablet Take 1 tablet by mouth daily.     potassium chloride SA (KLOR-CON M) 20 MEQ tablet Take 1 tablet (20 mEq total) by mouth daily. 90 tablet 3   telmisartan (MICARDIS) 80  MG tablet TAKE 1 TABLET BY MOUTH DAILY 90 tablet 3   No current facility-administered medications for this visit.    REVIEW OF SYSTEMS:    10 Point review of Systems was done is negative except as noted above.   PHYSICAL EXAMINATION: ECOG PERFORMANCE STATUS: 1 - Symptomatic but completely ambulatory .BP 120/79 (BP Location: Left Arm, Patient Position: Sitting)   Pulse 63   Temp 97.9 F (36.6 C) (Tympanic)   Resp 18   Ht 5\' 2"  (1.575 m)   Wt 133 lb 12.8 oz (60.7 kg)   LMP 08/05/2007   SpO2 100%   BMI 24.47 kg/m  GENERAL:alert, in no acute distress and comfortable SKIN: no acute rashes, no significant lesions EYES: conjunctiva are pink and non-injected, sclera anicteric OROPHARYNX: MMM, no exudates, no oropharyngeal erythema or ulceration NECK: supple, no JVD LYMPH:  no palpable lymphadenopathy in the cervical, axillary or inguinal regions LUNGS: clear to auscultation b/l with normal respiratory effort HEART: regular rate & rhythm ABDOMEN:  normoactive bowel sounds , non tender, not distended. Extremity: no pedal edema PSYCH: alert & oriented x 3 with fluent speech NEURO: no focal  motor/sensory deficits   LABORATORY DATA:  I have reviewed the data as listed .    Latest Ref Rng & Units 01/11/2024    9:28 AM 12/19/2023    6:03 PM 10/19/2023    9:33 AM  CBC  WBC 4.0 - 10.5 K/uL 12.7  12.7  9.3   Hemoglobin 12.0 - 15.0 g/dL 40.9  81.1  91.4   Hematocrit 36.0 - 46.0 % 40.6  35.8  41.6   Platelets 150 - 400 K/uL 274  187  242    .    Latest Ref Rng & Units 01/11/2024    9:28 AM 12/19/2023    6:03 PM 10/19/2023    9:33 AM  CMP  Glucose 70 - 99 mg/dL 71  95  83   BUN 8 - 23 mg/dL 21  9  21    Creatinine 0.44 - 1.00 mg/dL 7.82  9.56  2.13   Sodium 135 - 145 mmol/L 144  141  142   Potassium 3.5 - 5.1 mmol/L 3.3  2.9  3.5   Chloride 98 - 111 mmol/L 105  106  105   CO2 22 - 32 mmol/L 30  24  31    Calcium 8.9 - 10.3 mg/dL 9.8  9.6  9.7   Total Protein 6.5 - 8.1 g/dL 6.5   6.6   Total Bilirubin 0.0 - 1.2 mg/dL 0.6   0.7   Alkaline Phos 38 - 126 U/L 69   57   AST 15 - 41 U/L 16   20   ALT 0 - 44 U/L 11   13    . Lab Results  Component Value Date   LDH 280 (H) 02/28/2019     RADIOGRAPHIC STUDIES: I have personally reviewed the radiological images as listed and agreed with the findings in the report. CT Angio Chest Pulmonary Embolism (PE) W or WO Contrast Result Date: 12/21/2023 CLINICAL DATA:  Shortness of breath. EXAM: CT ANGIOGRAPHY CHEST WITH CONTRAST TECHNIQUE: Multidetector CT imaging of the chest was performed using the standard protocol during bolus administration of intravenous contrast. Multiplanar CT image reconstructions and MIPs were obtained to evaluate the vascular anatomy. RADIATION DOSE REDUCTION: This exam was performed according to the departmental dose-optimization program which includes automated exposure control, adjustment of the mA and/or kV according to patient size and/or use of  iterative reconstruction technique. CONTRAST:  75mL OMNIPAQUE IOHEXOL 350 MG/ML SOLN COMPARISON:  CT dated 09/13/2019. Chest radiograph dated 12/19/2023. FINDINGS:  Cardiovascular: There is no cardiomegaly or pericardial effusion. Mild atherosclerotic calcification of the thoracic aorta. No aneurysmal dilatation or dissection. No pulmonary artery embolus identified. Mediastinum/Nodes: No hilar or mediastinal adenopathy. The esophagus is grossly unremarkable. No mediastinal fluid collection. Lungs/Pleura: No focal consolidation, pleural effusion, or pneumothorax. The central airways are patent. Upper Abdomen: No acute abnormality. Musculoskeletal: No acute osseous pathology. Indeterminate 1.0 x 1.5 cm nodule in the left breast. Correlation with clinical exam and mammographic studies recommended. Review of the MIP images confirms the above findings. IMPRESSION: 1. No acute intrathoracic pathology. No CT evidence of pulmonary artery embolus. 2. Indeterminate 1.0 x 1.5 cm nodule in the left breast. Correlation with clinical exam and mammographic studies recommended. Electronically Signed   By: Angus Bark M.D.   On: 12/21/2023 13:10   DG Chest 2 View Result Date: 12/19/2023 CLINICAL DATA:  Intermittent shortness of breath since yesterday EXAM: CHEST - 2 VIEW COMPARISON:  None Available. FINDINGS: The heart size and mediastinal contours are within normal limits. Both lungs are clear. The visualized skeletal structures are unremarkable. IMPRESSION: No active cardiopulmonary disease. Electronically Signed   By: Bobbye Burrow M.D.   On: 12/19/2023 19:29    ASSESSMENT & PLAN:   73 year old woman with:   1.  CLL diagnosed in 2018.  She presented with lymphocytosis and adenopathy and currently in remission. Current treatment is Ibrutinib.     2.  Autoimmune considerations: She has not experienced any autoimmune thrombocytopenia or acute hemolytic anemia.  Will continue to monitor.   3.  Hypokalemia.   4.  Arthritis of the hip: Continues to follow with orthopedics regarding this issue.  She is status post injection of the left hip with improvement in her pain. Planned  for left THA  PLAN:  -Discussed lab results on 01/11/24 in detail with patient. CBC showed WBC of 12.7K, hemoglobin of 13.8, and platelets of 274K. -WBCs are slightly elevated, though not concerning -Lymphocytes are in upper limits of normal range at [facility] -her hgb has improved from 12 three weeks ago due to blood loss, to nearly 14 currently -platelets remain normal -CMP shows normal kidney and liver function. Her potassium is slightly low at 3.3 mmol/L, which is primarily from hydrochlorothiazide and running out of potassium replacement -Discussed option of OTC potassium replacement -recommend taking two 20 MEQ potassium tablets daily for a few days then switching to once daily  -will order potassium for a month -discussed that if her chronic use of diuretic may require ongoing potassium, she shall connect with her PCP to manage -did not feel any enlarged lymph nodes, liver, or spleen during physical examination -will refill Ibrutinib -Patient is tolerating Ibrutinib 420 mg well without any severe toxicities.  -Patient will continue taking Ibrutinib 420 mg as prescribed.  -discussed that her surgical scar should generally shrink and contract over 6-12 months  -recommend re-engaging the muscles with a combination of different types of activities -patient shall return to our clinic in 3-4 months -continue to follow up with dermatologist once a year given that CLL does increase the risk of certain skin cancers -answered all of patient's and her husband's questions in detail  FOLLOW-UP: RTC with Dr Salomon Cree with labs in 3 months  The total time spent in the appointment was 30 minutes* .  All of the patient's questions were answered with apparent satisfaction. The patient  knows to call the clinic with any problems, questions or concerns.   Jacquelyn Matt MD MS AAHIVMS [facility] Hematology/Oncology Physician [facility]  .*Total Encounter Time as defined by the Centers for Medicare  and Medicaid Services includes, in addition to the face-to-face time of a patient visit (documented in the note above) non-face-to-face time: obtaining and reviewing outside history, ordering and reviewing medications, tests or procedures, care coordination (communications with other health care professionals or caregivers) and documentation in the medical record.    I,Mitra Faeizi,acting as a Neurosurgeon for Jacquelyn Matt, MD.,have documented all relevant documentation on the behalf of Jacquelyn Matt, MD,as directed by  Jacquelyn Matt, MD while in the presence of Jacquelyn Matt, MD.  .I have reviewed the above documentation for accuracy and completeness, and I agree with the above. .Meldrick Buttery Kishore Jaylea Plourde MD

## 2024-01-12 ENCOUNTER — Other Ambulatory Visit (HOSPITAL_COMMUNITY): Payer: Self-pay

## 2024-01-12 ENCOUNTER — Telehealth: Payer: Self-pay | Admitting: Pharmacy Technician

## 2024-01-12 NOTE — Telephone Encounter (Signed)
 Oral Oncology Patient Advocate Encounter   Received notification that prior authorization for Imbruvica is due for renewal.   Filled out paper form and left in Dr. Clyda Greener box to be signed.   Sherilyn Dacosta, CPhT Specialty Pharmacy Patient Advocate Phone: [phone] Fax: [phone]

## 2024-01-13 ENCOUNTER — Other Ambulatory Visit (HOSPITAL_COMMUNITY): Payer: Self-pay

## 2024-01-13 NOTE — Telephone Encounter (Signed)
 Oral Oncology Patient Advocate Encounter  Prior Authorization for Maurine Simmering has been approved.    PA# [id number]  Effective dates: 01/13/24 through 07/14/24  Patient may continue to fill at [facility] as they have been.    Ardeen Fillers, CPhT Pharmacy Technician Coordinator [facility] [phone] (Ph) 01/13/2024 2:24 PM

## 2024-01-18 ENCOUNTER — Telehealth: Payer: Self-pay | Admitting: Neurology

## 2024-01-18 NOTE — Telephone Encounter (Signed)
 Pt has cx appointment, she said it is no longer needed

## 2024-01-27 ENCOUNTER — Other Ambulatory Visit: Payer: Self-pay

## 2024-01-27 DIAGNOSIS — C9110 Chronic lymphocytic leukemia of B-cell type not having achieved remission: Secondary | ICD-10-CM

## 2024-01-27 MED ORDER — IBRUTINIB 420 MG PO TABS: 420.0000 mg | ORAL_TABLET | Freq: Every day | ORAL | 5 refills | Status: DC

## 2024-02-07 ENCOUNTER — Telehealth: Payer: Self-pay

## 2024-02-22 NOTE — Telephone Encounter (Signed)
 error

## 2024-02-28 ENCOUNTER — Other Ambulatory Visit: Payer: Self-pay

## 2024-02-28 DIAGNOSIS — C9110 Chronic lymphocytic leukemia of B-cell type not having achieved remission: Secondary | ICD-10-CM

## 2024-02-28 MED ORDER — IBRUTINIB 420 MG PO TABS: 420.0000 mg | ORAL_TABLET | Freq: Every day | ORAL | 5 refills | Status: DC

## 2024-03-01 ENCOUNTER — Other Ambulatory Visit: Payer: Self-pay | Admitting: Cardiology

## 2024-04-04 ENCOUNTER — Other Ambulatory Visit: Payer: Self-pay

## 2024-04-04 DIAGNOSIS — C9110 Chronic lymphocytic leukemia of B-cell type not having achieved remission: Secondary | ICD-10-CM

## 2024-04-04 MED ORDER — IBRUTINIB 420 MG PO TABS: 420.0000 mg | ORAL_TABLET | Freq: Every day | ORAL | 5 refills | Status: DC

## 2024-04-05 ENCOUNTER — Ambulatory Visit: Admitting: Neurology

## 2024-04-12 ENCOUNTER — Other Ambulatory Visit (HOSPITAL_COMMUNITY): Payer: Self-pay

## 2024-04-12 ENCOUNTER — Telehealth: Payer: Self-pay | Admitting: Pharmacy Technician

## 2024-04-12 NOTE — Telephone Encounter (Signed)
 Oral Oncology Patient Advocate Encounter  Received confirmation that the copay card on file at [facility] is active and uses a virtual debit card to cover the cost of the medication.  I have spoken with the patient to make them aware of this and connected them with [facility] to set up their refill of Imbruvica .  Estefana Sox, CPhT-Adv Oncology Pharmacy Patient Advocate [facility]  Direct Number: [phone]  Fax: [phone]

## 2024-04-12 NOTE — Telephone Encounter (Signed)
 Oral Oncology Patient Advocate Encounter  Received notification that patient was asked for a debit card when refilling her Imbruvica . Patient reached out to the office since she has never been asked for this before.   I have reached out to our [facility] contact to confirm if patient has an active copay card on their file.  Estefana Sox, CPhT-Adv Oncology Pharmacy Patient Advocate [facility]  Direct Number: [phone]  Fax: [phone]

## 2024-04-24 ENCOUNTER — Other Ambulatory Visit: Payer: Self-pay

## 2024-04-24 DIAGNOSIS — E876 Hypokalemia: Secondary | ICD-10-CM

## 2024-04-24 DIAGNOSIS — C9110 Chronic lymphocytic leukemia of B-cell type not having achieved remission: Secondary | ICD-10-CM

## 2024-04-24 NOTE — Progress Notes (Signed)
 HEMATOLOGY/ONCOLOGY CLINIC NOTE  Date of Service: 04/25/2024  Patient Care Team: Nichole Senior, MD as PCP - General (Endocrinology)  CHIEF COMPLAINTS/PURPOSE OF CONSULTATION:  CLL (chronic lymphocytic leukemia) (HCC)   HISTORY OF PRESENTING ILLNESS:  Alice Pollard is a wonderful 73 y.o. female who is here for continued evaluation and management of CLL. Patient was following up with Dr. Amadeo.   Patient was diagnosed with CLL with lymphocytosis and adenopathy in 2018. Her secondary diagnosis was Fibroadenoma of the left breast diagnosed in 2018, which she had surgical resection and no evidence of relapse after the resection.   Patient's current treatment for CLL is Ibrutinib  420 mg, which was started in December 2020. Her treatment was temporarily withheld from April 2023 to July 2023 because she was diagnosed with hip arthritis and patient thought the joint pain was due to Ibritinib.   Patient's last visit with Dr. Amadeo was on 08/27/2022 and she was doing well overall. She noted that her hip pain improved after steroid injection from her orthopedic physician.   Patient reports she has been doing well without any new medical concerns since her last visit with Dr. Amadeo. She is tolerating her Ibritinib 420 mg without any severe toxicities. She does report occasional bruising due to Ibritinib.   Patient notes she previously had skin rashes near her upper back. She went to the dermatologist but could not figure out the reason for skin rash. Patient has not had any other skin rashes since then. She was on Ibritinib when she had the skin rashes.   She denies new lumps/bumps, infection issues, fever, chills, night sweats, shortness of breath, chest pain, abnormal bowel movement, abnormal bleeding issues, or leg swelling. She occasionally takes tylenol  for pain management. She has degenerative disk issue.    INTERVAL HISTORY: Alice Pollard is a wonderful 73 y.o. female who is here  for continued evaluation and management of CLL.   Patient was last seen by me on 01/11/2024 and reported mild hip aching with increased walking following her hip surgery.  She reports intermittent right shoulder strain from grabbing her grandchild since 1 month ago. Patient notes that lifting her right arm does trigger pain. She manages her pain with Tylenol . Patient notes that she applies a heating pad as well. Her shoulder/rotator cuff is not actively painful at this time.   Patient reports that she generally has no hip issues, but notes aching when walking far distances. Her scar from previous hip surgery appears to be healed very well.   Patient denies any infection issues, new lumps/bumps, fevers, chills, abdominal pain, or new leg swelling.   She has been tolerating Ibrutinib  well with no major toxicities. Patient denies any abdominal bleeding.    She reports mild diarrhea, which is not persistent or significant.   Her blood pressure in clinic is noted to be 147/82.   Patient noted to have lost 6 pounds since April, and 11 pounds overall since March. Her weight in clinic is noted to be 127 pounds. She attributes her weight loss to lower p.o. intake.   Patient notes that she was seen by a dermatologist several years ago for evaluation of a skin lesion around her neck line. She notes that the area is not painful, burning, or itchy. She notes that she only wore jewelry around the area of the neck line on one occasion. Patient notes that her dermatologist did not think that there was concern for basal cell carcinoma. However, patient believes  being told that she had Squamous cell carcinoma by her dermatologist. Patient does not remember which dermatologist she saw previously.   MEDICAL HISTORY:  Past Medical History:  Diagnosis Date   ADD (attention deficit disorder)    CLL (chronic lymphocytic leukemia) (HCC)    Dyslipidemia    Endometriosis    Hematuria    h/o asymptomatic   HTN  (hypertension)    PONV (postoperative nausea and vomiting)     SURGICAL HISTORY: Past Surgical History:  Procedure Laterality Date   LAPAROSCOPY     endometriosis   RADIOACTIVE SEED GUIDED EXCISIONAL BREAST BIOPSY Left 09/13/2017   Procedure: RADIOACTIVE SEED GUIDED EXCISIONAL BREAST BIOPSY;  Surgeon: Ebbie Cough, MD;  Location: [facility];  Service: General;  Laterality: Left;   TOTAL HIP ARTHROPLASTY Left     SOCIAL HISTORY: Social History   Socioeconomic History   Marital status: Married    Spouse name: Not on file   Number of children: 3   Years of education: Not on file   Highest education level: Not on file  Occupational History   Occupation: Soil scientist  Tobacco Use   Smoking status: Never   Smokeless tobacco: Never  Vaping Use   Vaping status: Never Used  Substance and Sexual Activity   Alcohol use: Not Currently   Drug use: No   Sexual activity: Not Currently    Partners: Male    Birth control/protection: Post-menopausal    Comment: vasectomy  Other Topics Concern   Not on file  Social History Narrative   Not on file   Social Drivers of Health   Financial Resource Strain: Not on file  Food Insecurity: Not on file  Transportation Needs: Not on file  Physical Activity: Not on file  Stress: Not on file  Social Connections: Not on file  Intimate Partner Violence: Not on file    FAMILY HISTORY: Family History  Problem Relation Age of Onset   Heart failure Mother    Diabetes Father    Other Brother        bilateral bladder   Colon cancer Neg Hx     ALLERGIES:  is allergic to codeine, latex, and other.  MEDICATIONS:  Current Outpatient Medications  Medication Sig Dispense Refill   CALCIUM  PO Take 600 mg by mouth daily. PATIENT NOT SURE OF DOSAGE     FLUoxetine  (PROZAC ) 20 MG tablet TAKE 1 TABLET(20 MG) BY MOUTH DAILY 90 tablet 2   hydrochlorothiazide  (MICROZIDE ) 12.5 MG capsule TAKE 1 CAPSULE(12.5 MG) BY MOUTH DAILY  90 capsule 3   ibrutinib  (IMBRUVICA ) 420 MG tablet Take 1 tablet (420 mg total) by mouth daily. Take with a glass of water. 28 tablet 5   Magnesium Gluconate (MAGNESIUM 27 PO) Take by mouth.     metoprolol  succinate (TOPROL -XL) 25 MG 24 hr tablet TAKE 1 TABLET(25 MG) BY MOUTH DAILY 90 tablet 3   Multiple Vitamin (MULTIVITAMIN) tablet Take 1 tablet by mouth daily.     potassium chloride  SA (KLOR-CON  M) 20 MEQ tablet Take 1 tablet (20 mEq total) by mouth daily. 90 tablet 0   telmisartan  (MICARDIS ) 80 MG tablet TAKE 1 TABLET BY MOUTH DAILY 90 tablet 3   No current facility-administered medications for this visit.    REVIEW OF SYSTEMS:    10 Point review of Systems was done is negative except as noted above.   PHYSICAL EXAMINATION: ECOG PERFORMANCE STATUS: 1 - Symptomatic but completely ambulatory .BP (!) 147/82   Pulse 62  Temp 97.9 F (36.6 C)   Resp 18   Wt 127 lb 4.8 oz (57.7 kg)   LMP 08/05/2007   SpO2 99%   BMI 23.28 kg/m    GENERAL:alert, in no acute distress and comfortable SKIN: no acute rashes, no significant lesions EYES: conjunctiva are pink and non-injected, sclera anicteric OROPHARYNX: MMM, no exudates, no oropharyngeal erythema or ulceration NECK: supple, no JVD LYMPH:  no palpable lymphadenopathy in the cervical, axillary or inguinal regions LUNGS: clear to auscultation b/l with normal respiratory effort HEART: regular rate & rhythm ABDOMEN:  normoactive bowel sounds , non tender, not distended. Extremity: no pedal edema PSYCH: alert & oriented x 3 with fluent speech NEURO: no focal motor/sensory deficits   LABORATORY DATA:  I have reviewed the data as listed .    Latest Ref Rng & Units 04/25/2024    9:46 AM 01/11/2024    9:28 AM 12/19/2023    6:03 PM  CBC  WBC 4.0 - 10.5 K/uL 10.8  12.7  12.7   Hemoglobin 12.0 - 15.0 g/dL 85.9  86.1  87.9   Hematocrit 36.0 - 46.0 % 41.5  40.6  35.8   Platelets 150 - 400 K/uL 240  274  187    .    Latest Ref Rng &  Units 04/25/2024    9:46 AM 01/11/2024    9:28 AM 12/19/2023    6:03 PM  CMP  Glucose 70 - 99 mg/dL 83  71  95   BUN 8 - 23 mg/dL 13  21  9    Creatinine 0.44 - 1.00 mg/dL 9.14  9.07  9.23   Sodium 135 - 145 mmol/L 142  144  141   Potassium 3.5 - 5.1 mmol/L 3.7  3.3  2.9   Chloride 98 - 111 mmol/L 105  105  106   CO2 22 - 32 mmol/L 32  30  24   Calcium  8.9 - 10.3 mg/dL 9.5  9.8  9.6   Total Protein 6.5 - 8.1 g/dL 6.2  6.5    Total Bilirubin 0.0 - 1.2 mg/dL 0.8  0.6    Alkaline Phos 38 - 126 U/L 53  69    AST 15 - 41 U/L 17  16    ALT 0 - 44 U/L 9  11     . Lab Results  Component Value Date   LDH 280 (H) 02/28/2019     RADIOGRAPHIC STUDIES: I have personally reviewed the radiological images as listed and agreed with the findings in the report. No results found.   ASSESSMENT & PLAN:   73 year old woman with:   1.  CLL diagnosed in 2018.  She presented with lymphocytosis and adenopathy and currently in remission. Current treatment is Ibrutinib .     2.  Autoimmune considerations: She has not experienced any autoimmune thrombocytopenia or acute hemolytic anemia.  Will continue to monitor.   3.  Hypokalemia.   4.  Arthritis of the hip: Continues to follow with orthopedics regarding this issue.  She is status post injection of the left hip with improvement in her pain. Planned for left THA  PLAN:  -Discussed lab results on 04/25/2024 in detail with patient. CBC normal, showed WBC of 10.8K, hemoglobin of 14, and platelets of 240K. -total WBC improved from 12.7 to 10.8 in the last 3 months -lymphocytes improved from 6 to 5.5   -HGB normal -PLT normal -CMP stable -did not feel any enlarged lymph nodes, enlarged liver, or enlarged spleen during  physical examination -there is no clinical sign or lab evidence suggestive of CLL progression at this time -patient has tolerated Ibrutinib  420 mg well with no major toxicities -continue Ibrutinib  420 mg at this time -discussed that we  will continue Ibrutinib  as long as it continues to be effective, she continues to tolerate, and she chooses to -educated patient of possible indications of CLL progression, which is not a concern at this time  -discussed that it is possible that she stretched her right shoulder muscle -recommend to avoid any heavy lifting with the right arm -discussed that skin lesions around the neckline can also commonly be from insect bites, exposure of certain jewelry, and contact dermatitis -recommend skin screenings once a year with dermatologist due to increased risks of skin cancers with CLL -discussed that if she is an establish pt with a previous dermatologist, she may be able to be seen sooner -patient shall let us  or her PCP know if she is needing a referral to dermatologist.  -recommend patient to stay UTD with her age-appropriate vaccines with her PCP, including COVID-19, shingles, and pneumonia vaccine -recommend patient to stay UTD with her age-appropriate cancer screenings, including mammograms with her PCP -answered all of patient's questions in detail -she shall return to clinic in 3-4 months  FOLLOW-UP: RTC with Dr Onesimo with labs in 3 months  The total time spent in the appointment was 30 minutes* .  All of the patient's questions were answered with apparent satisfaction. The patient knows to call the clinic with any problems, questions or concerns.   Emaline Onesimo MD MS AAHIVMS [facility] Hematology/Oncology Physician [facility]  .*Total Encounter Time as defined by the Centers for Medicare and Medicaid Services includes, in addition to the face-to-face time of a patient visit (documented in the note above) non-face-to-face time: obtaining and reviewing outside history, ordering and reviewing medications, tests or procedures, care coordination (communications with other health care professionals or caregivers) and documentation in the medical record.    I,Mitra Faeizi,acting  as a Neurosurgeon for Emaline Onesimo, MD.,have documented all relevant documentation on the behalf of Emaline Onesimo, MD,as directed by  Emaline Onesimo, MD while in the presence of Emaline Onesimo, MD.  .I have reviewed the above documentation for accuracy and completeness, and I agree with the above. .Paiten Boies Kishore Batool Majid MD

## 2024-04-25 ENCOUNTER — Inpatient Hospital Stay: Attending: Hematology

## 2024-04-25 ENCOUNTER — Inpatient Hospital Stay (HOSPITAL_BASED_OUTPATIENT_CLINIC_OR_DEPARTMENT_OTHER): Admitting: Hematology

## 2024-04-25 VITALS — BP 147/82 | HR 62 | Temp 97.9°F | Resp 18 | Wt 127.3 lb

## 2024-04-25 DIAGNOSIS — Z8052 Family history of malignant neoplasm of bladder: Secondary | ICD-10-CM | POA: Diagnosis not present

## 2024-04-25 DIAGNOSIS — E876 Hypokalemia: Secondary | ICD-10-CM | POA: Insufficient documentation

## 2024-04-25 DIAGNOSIS — C9111 Chronic lymphocytic leukemia of B-cell type in remission: Secondary | ICD-10-CM | POA: Diagnosis present

## 2024-04-25 DIAGNOSIS — M169 Osteoarthritis of hip, unspecified: Secondary | ICD-10-CM | POA: Insufficient documentation

## 2024-04-25 DIAGNOSIS — C9110 Chronic lymphocytic leukemia of B-cell type not having achieved remission: Secondary | ICD-10-CM

## 2024-04-25 LAB — CMP (CANCER CENTER ONLY)
ALT: 9 U/L (ref 0–44)
AST: 17 U/L (ref 15–41)
Albumin: 4.2 g/dL (ref 3.5–5.0)
Alkaline Phosphatase: 53 U/L (ref 38–126)
Anion gap: 5 (ref 5–15)
BUN: 13 mg/dL (ref 8–23)
CO2: 32 mmol/L (ref 22–32)
Calcium: 9.5 mg/dL (ref 8.9–10.3)
Chloride: 105 mmol/L (ref 98–111)
Creatinine: 0.85 mg/dL (ref 0.44–1.00)
GFR, Estimated: >60 mL/min (ref >60)
Glucose, Bld: 83 mg/dL (ref 70–99)
Potassium: 3.7 mmol/L (ref 3.5–5.1)
Sodium: 142 mmol/L (ref 135–145)
Total Bilirubin: 0.8 mg/dL (ref 0.0–1.2)
Total Protein: 6.2 g/dL — ABNORMAL LOW (ref 6.5–8.1)

## 2024-04-25 LAB — CBC WITH DIFFERENTIAL (CANCER CENTER ONLY)
Abs Immature Granulocytes: 0.02 K/uL (ref 0.00–0.07)
Basophils Absolute: 0 K/uL (ref 0.0–0.1)
Basophils Relative: 0 %
Eosinophils Absolute: 0 K/uL (ref 0.0–0.5)
Eosinophils Relative: 0 %
HCT: 41.5 % (ref 36.0–46.0)
Hemoglobin: 14 g/dL (ref 12.0–15.0)
Immature Granulocytes: 0 %
Lymphocytes Relative: 51 %
Lymphs Abs: 5.5 K/uL — ABNORMAL HIGH (ref 0.7–4.0)
MCH: 31.3 pg (ref 26.0–34.0)
MCHC: 33.7 g/dL (ref 30.0–36.0)
MCV: 92.8 fL (ref 80.0–100.0)
Monocytes Absolute: 0.7 K/uL (ref 0.1–1.0)
Monocytes Relative: 7 %
Neutro Abs: 4.6 K/uL (ref 1.7–7.7)
Neutrophils Relative %: 42 %
Platelet Count: 240 K/uL (ref 150–400)
RBC: 4.47 MIL/uL (ref 3.87–5.11)
RDW: 13.5 % (ref 11.5–15.5)
WBC Count: 10.8 K/uL — ABNORMAL HIGH (ref 4.0–10.5)
nRBC: 0 % (ref 0.0–0.2)

## 2024-06-18 ENCOUNTER — Ambulatory Visit: Admitting: Neurology

## 2024-06-19 DIAGNOSIS — R413 Other amnesia: Secondary | ICD-10-CM | POA: Insufficient documentation

## 2024-06-19 DIAGNOSIS — I700 Atherosclerosis of aorta: Secondary | ICD-10-CM | POA: Insufficient documentation

## 2024-06-19 DIAGNOSIS — I5189 Other ill-defined heart diseases: Secondary | ICD-10-CM | POA: Insufficient documentation

## 2024-06-19 DIAGNOSIS — Z0000 Encounter for general adult medical examination without abnormal findings: Secondary | ICD-10-CM | POA: Insufficient documentation

## 2024-06-19 DIAGNOSIS — R634 Abnormal weight loss: Secondary | ICD-10-CM | POA: Insufficient documentation

## 2024-06-25 ENCOUNTER — Ambulatory Visit: Admitting: Neurology

## 2024-06-25 ENCOUNTER — Encounter: Payer: Self-pay | Admitting: Neurology

## 2024-06-25 ENCOUNTER — Telehealth: Payer: Self-pay | Admitting: Neurology

## 2024-06-25 NOTE — Telephone Encounter (Signed)
  Pt daughter called to cancel appt today appt Rescheduled

## 2024-07-05 ENCOUNTER — Telehealth: Payer: Self-pay

## 2024-07-05 NOTE — Telephone Encounter (Signed)
 Oral Oncology Patient Advocate Encounter   Received notification that prior authorization for ibrutinib  (IMBRUVICA ) 420 MG tablet  is required.   PA will be completed manually      Lucie Lamer, CPhT [place]  [facility] Pharmacy Technician Patient Advocate Specialist [facility] [place] Phone: [phone]  Fax: [phone] [email or web address]

## 2024-07-06 ENCOUNTER — Other Ambulatory Visit (HOSPITAL_COMMUNITY): Payer: Self-pay

## 2024-07-10 NOTE — Telephone Encounter (Signed)
 Oral Oncology Patient Advocate Encounter  Prior Authorization for ibrutinib  (IMBRUVICA ) 420 MG tablet  has been approved.    PA# [id number] dp Effective dates: 07/10/24 through 01/08/25    Lucie Lamer, CPhT [place]  [facility] Pharmacy Technician Patient Advocate Specialist II [facility] [place] Phone: [phone]  Fax: [phone] [email or web address]

## 2024-07-10 NOTE — Telephone Encounter (Signed)
 Oral Oncology Patient Advocate Encounter  PA has been faxed over to [facility].  Will update once information is received.  Lucie Lamer, CPhT [place]  [facility] Pharmacy Technician Patient Advocate Specialist II [facility] [place] Phone: [phone]  Fax: [phone] [email or web address]

## 2024-07-17 ENCOUNTER — Other Ambulatory Visit: Payer: Self-pay

## 2024-07-17 DIAGNOSIS — C9110 Chronic lymphocytic leukemia of B-cell type not having achieved remission: Secondary | ICD-10-CM

## 2024-07-17 DIAGNOSIS — E876 Hypokalemia: Secondary | ICD-10-CM

## 2024-07-18 ENCOUNTER — Inpatient Hospital Stay: Admitting: Hematology

## 2024-07-18 ENCOUNTER — Inpatient Hospital Stay: Attending: Hematology

## 2024-07-18 VITALS — BP 132/77 | HR 64 | Temp 97.9°F | Resp 18 | Wt 127.6 lb

## 2024-07-18 DIAGNOSIS — C9111 Chronic lymphocytic leukemia of B-cell type in remission: Secondary | ICD-10-CM | POA: Diagnosis present

## 2024-07-18 DIAGNOSIS — E876 Hypokalemia: Secondary | ICD-10-CM | POA: Insufficient documentation

## 2024-07-18 DIAGNOSIS — M1612 Unilateral primary osteoarthritis, left hip: Secondary | ICD-10-CM | POA: Diagnosis not present

## 2024-07-18 DIAGNOSIS — C9110 Chronic lymphocytic leukemia of B-cell type not having achieved remission: Secondary | ICD-10-CM | POA: Diagnosis not present

## 2024-07-18 LAB — CBC WITH DIFFERENTIAL (CANCER CENTER ONLY)
Abs Immature Granulocytes: 0.01 K/uL (ref 0.00–0.07)
Basophils Absolute: 0.1 K/uL (ref 0.0–0.1)
Basophils Relative: 1 %
Eosinophils Absolute: 0.1 K/uL (ref 0.0–0.5)
Eosinophils Relative: 1 %
HCT: 42 % (ref 36.0–46.0)
Hemoglobin: 14.4 g/dL (ref 12.0–15.0)
Immature Granulocytes: 0 %
Lymphocytes Relative: 33 %
Lymphs Abs: 2.5 K/uL (ref 0.7–4.0)
MCH: 31.8 pg (ref 26.0–34.0)
MCHC: 34.3 g/dL (ref 30.0–36.0)
MCV: 92.7 fL (ref 80.0–100.0)
Monocytes Absolute: 0.9 K/uL (ref 0.1–1.0)
Monocytes Relative: 12 %
Neutro Abs: 4.1 K/uL (ref 1.7–7.7)
Neutrophils Relative %: 53 %
Platelet Count: 141 K/uL — ABNORMAL LOW (ref 150–400)
RBC: 4.53 MIL/uL (ref 3.87–5.11)
RDW: 12.4 % (ref 11.5–15.5)
WBC Count: 7.7 K/uL (ref 4.0–10.5)
nRBC: 0 % (ref 0.0–0.2)

## 2024-07-18 LAB — CMP (CANCER CENTER ONLY)
ALT: 11 U/L (ref 0–44)
AST: 20 U/L (ref 15–41)
Albumin: 4.2 g/dL (ref 3.5–5.0)
Alkaline Phosphatase: 66 U/L (ref 38–126)
Anion gap: 5 (ref 5–15)
BUN: 18 mg/dL (ref 8–23)
CO2: 33 mmol/L — ABNORMAL HIGH (ref 22–32)
Calcium: 9.8 mg/dL (ref 8.9–10.3)
Chloride: 104 mmol/L (ref 98–111)
Creatinine: 0.84 mg/dL (ref 0.44–1.00)
GFR, Estimated: >60 mL/min (ref >60)
Glucose, Bld: 86 mg/dL (ref 70–99)
Potassium: 3.4 mmol/L — ABNORMAL LOW (ref 3.5–5.1)
Sodium: 142 mmol/L (ref 135–145)
Total Bilirubin: 0.9 mg/dL (ref 0.0–1.2)
Total Protein: 6.5 g/dL (ref 6.5–8.1)

## 2024-07-18 NOTE — Progress Notes (Signed)
 HEMATOLOGY/ONCOLOGY CLINIC NOTE  Date of Service:.07/18/2024   Patient Care Team: Nichole Senior, MD as PCP - General (Endocrinology)  CHIEF COMPLAINTS/PURPOSE OF CONSULTATION:  F/u for CLL (chronic lymphocytic leukemia) (HCC)   HISTORY OF PRESENTING ILLNESS:  Alice Pollard is a wonderful 73 y.o. female who is here for continued evaluation and management of CLL. Patient was following up with Dr. Amadeo.   Patient was diagnosed with CLL with lymphocytosis and adenopathy in 2018. Her secondary diagnosis was Fibroadenoma of the left breast diagnosed in 2018, which she had surgical resection and no evidence of relapse after the resection.   Patient's current treatment for CLL is Ibrutinib  420 mg, which was started in December 2020. Her treatment was temporarily withheld from April 2023 to July 2023 because she was diagnosed with hip arthritis and patient thought the joint pain was due to Ibritinib.    INTERVAL HISTORY: Alice Pollard is a wonderful 73 y.o. female who is here for continued evaluation and management of CLL.   Patient was last seen by me on 04/25/2024 reported intermittent right shoulder strain from grabbing her grandchild since 1 month ago, noting that lifting her right arm does trigger pain, and that she generally has no hip issues, but notes aching when walking far distances.  Endorsed mild diarrhea, not persistent or significant.   Today, she says that she does not have any concerns. Denies any new infection issues, fevers/chills, drenching night sweats, back pain, new bone pains (hip pain is improving), new lumps/bumps, bleeding issues (nose bleeds, gum bleeds, abnormal/spontaneous bruising), or SOB. Endorses some fatigue.   Notes that she had 2 skin lesions removed - BCC on her upper back and upper left side of chest - and is returning to Derm in 6 months.  On review of her low Potassium levels, she is notably taking HCTZ and is on Klor-Con  20 meq  daily.  MEDICAL HISTORY:  Past Medical History:  Diagnosis Date   ADD (attention deficit disorder)    CLL (chronic lymphocytic leukemia) (HCC)    Dyslipidemia    Endometriosis    Hematuria    h/o asymptomatic   HTN (hypertension)    PONV (postoperative nausea and vomiting)     SURGICAL HISTORY: Past Surgical History:  Procedure Laterality Date   LAPAROSCOPY     endometriosis   RADIOACTIVE SEED GUIDED EXCISIONAL BREAST BIOPSY Left 09/13/2017   Procedure: RADIOACTIVE SEED GUIDED EXCISIONAL BREAST BIOPSY;  Surgeon: Ebbie Cough, MD;  Location: [facility];  Service: General;  Laterality: Left;   TOTAL HIP ARTHROPLASTY Left     SOCIAL HISTORY: Social History   Socioeconomic History   Marital status: Married    Spouse name: Not on file   Number of children: 3   Years of education: Not on file   Highest education level: Not on file  Occupational History   Occupation: Soil scientist  Tobacco Use   Smoking status: Never   Smokeless tobacco: Never  Vaping Use   Vaping status: Never Used  Substance and Sexual Activity   Alcohol use: Not Currently   Drug use: No   Sexual activity: Not Currently    Partners: Male    Birth control/protection: Post-menopausal    Comment: vasectomy  Other Topics Concern   Not on file  Social History Narrative   Not on file   Social Drivers of Health   Financial Resource Strain: Not on file  Food Insecurity: Not on file  Transportation Needs: Not  on file  Physical Activity: Not on file  Stress: Not on file  Social Connections: Not on file  Intimate Partner Violence: Not on file    FAMILY HISTORY: Family History  Problem Relation Age of Onset   Heart failure Mother    Diabetes Father    Other Brother        bilateral bladder   Colon cancer Neg Hx     ALLERGIES:  is allergic to codeine, latex, and other.  MEDICATIONS:  Current Outpatient Medications  Medication Sig Dispense Refill   CALCIUM  PO Take  600 mg by mouth daily. PATIENT NOT SURE OF DOSAGE     FLUoxetine  (PROZAC ) 20 MG tablet TAKE 1 TABLET(20 MG) BY MOUTH DAILY 90 tablet 2   hydrochlorothiazide  (MICROZIDE ) 12.5 MG capsule TAKE 1 CAPSULE(12.5 MG) BY MOUTH DAILY 90 capsule 3   ibrutinib  (IMBRUVICA ) 420 MG tablet Take 1 tablet (420 mg total) by mouth daily. Take with a glass of water. 28 tablet 5   Magnesium Gluconate (MAGNESIUM 27 PO) Take by mouth.     metoprolol  succinate (TOPROL -XL) 25 MG 24 hr tablet TAKE 1 TABLET(25 MG) BY MOUTH DAILY 90 tablet 3   Multiple Vitamin (MULTIVITAMIN) tablet Take 1 tablet by mouth daily.     potassium chloride  SA (KLOR-CON  M) 20 MEQ tablet Take 1 tablet (20 mEq total) by mouth daily. 90 tablet 0   telmisartan  (MICARDIS ) 80 MG tablet TAKE 1 TABLET BY MOUTH DAILY 90 tablet 3   No current facility-administered medications for this visit.    REVIEW OF SYSTEMS:    10 Point review of Systems was done is negative except as noted above.   PHYSICAL EXAMINATION: ECOG PERFORMANCE STATUS: 1 - Symptomatic but completely ambulatory BP 132/77   Pulse 64   Temp 97.9 F (36.6 C)   Resp 18   Wt 127 lb 9.6 oz (57.9 kg)   LMP 08/05/2007   SpO2 97%   BMI 23.34 kg/m  GENERAL:alert, in no acute distress and comfortable SKIN: no acute rashes, no significant lesions, (+) areas of scarring related to removal of skin lesions EYES: conjunctiva are pink and non-injected, sclera anicteric OROPHARYNX: MMM, no exudates, no oropharyngeal erythema or ulceration NECK: supple, no JVD LYMPH:  no palpable lymphadenopathy in the cervical, axillary or inguinal regions LUNGS: clear to auscultation b/l with normal respiratory effort HEART: regular rate & rhythm ABDOMEN:  normoactive bowel sounds , non tender, not distended. Extremity: no pedal edema PSYCH: alert & oriented x 3 with fluent speech NEURO: no focal motor/sensory deficits   LABORATORY DATA:  I have reviewed the data as listed     Latest Ref Rng & Units  07/18/2024    8:38 AM 04/25/2024    9:46 AM 01/11/2024    9:28 AM  CBC EXTENDED  WBC 4.0 - 10.5 K/uL 7.7  10.8  12.7   RBC 3.87 - 5.11 MIL/uL 4.53  4.47  4.41   Hemoglobin 12.0 - 15.0 g/dL 85.5  85.9  86.1   HCT 36.0 - 46.0 % 42.0  41.5  40.6   Platelets 150 - 400 K/uL 141  240  274   NEUT# 1.7 - 7.7 K/uL 4.1  4.6  5.8   Lymph# 0.7 - 4.0 K/uL 2.5  5.5  6.0       Latest Ref Rng & Units 07/18/2024    8:38 AM 04/25/2024    9:46 AM 01/11/2024    9:28 AM  CMP  Glucose 70 - 99 mg/dL  86  83  71   BUN 8 - 23 mg/dL 18  13  21    Creatinine 0.44 - 1.00 mg/dL 9.15  9.14  9.07   Sodium 135 - 145 mmol/L 142  142  144   Potassium 3.5 - 5.1 mmol/L 3.4  3.7  3.3   Chloride 98 - 111 mmol/L 104  105  105   CO2 22 - 32 mmol/L 33  32  30   Calcium  8.9 - 10.3 mg/dL 9.8  9.5  9.8   Total Protein 6.5 - 8.1 g/dL 6.5  6.2  6.5   Total Bilirubin 0.0 - 1.2 mg/dL 0.9  0.8  0.6   Alkaline Phos 38 - 126 U/L 66  53  69   AST 15 - 41 U/L 20  17  16    ALT 0 - 44 U/L 11  9  11     RADIOGRAPHIC STUDIES: I have personally reviewed the radiological images as listed and agreed with the findings in the report. No results found.   ASSESSMENT & PLAN:  73 year old woman with:   1.  CLL diagnosed in 2018.  She presented with lymphocytosis and adenopathy and currently in remission. Current treatment is Ibrutinib .     2.  Autoimmune considerations: She has not experienced any autoimmune thrombocytopenia or acute hemolytic anemia.  Will continue to monitor.   3.  Hypokalemia.   4.  Arthritis of the hip: Continues to follow with orthopedics regarding this issue.  She is status post injection of the left hip with improvement in her pain. Planned for left THA  PLAN:  - Discussed lab results on 07/18/2024 in detail with patient: CBC showed WBC of 7.7K decreased from 10.8K, Hemoglobin of 14.4, and PLTs of 141K decreased from 240K CMP with Potassium slightly low at 3.4 decreased from 3.7 . Is on HCTZ and Potassium  supplementation. Recommend increasing dietary Potassium.   - No enlarged lymph nodes or hepatosplenomegaly during physical examination - There is no clinical sign or lab evidence suggestive of CLL progression at this time - Patient has tolerated Ibrutinib  420 mg well with no major toxicities continue Ibrutinib  420 mg at this time - Educated that CLL, age, previous sun exposure are all risk factors for SCC/BCCs   Continue to follow with Dermatology q6 months  FOLLOW-UP: RTC with Dr Onesimo with labs in 3 months  .The total time spent in the appointment was 21 minutes* .  All of the patient's questions were answered with apparent satisfaction. The patient knows to call the clinic with any problems, questions or concerns.   Emaline Onesimo MD MS AAHIVMS [facility] Hematology/Oncology Physician [facility]  .*Total Encounter Time as defined by the Centers for Medicare and Medicaid Services includes, in addition to the face-to-face time of a patient visit (documented in the note above) non-face-to-face time: obtaining and reviewing outside history, ordering and reviewing medications, tests or procedures, care coordination (communications with other health care professionals or caregivers) and documentation in the medical record.   I, Damien Blanks, acting as a Neurosurgeon for Emaline Onesimo, MD.,have documented all relevant documentation on the behalf of Emaline Onesimo, MD,as directed by  Emaline Onesimo, MD while in the presence of Emaline Onesimo, MD.  I have reviewed the above documentation for accuracy and completeness, and I agree with the above. Emaline Candida Onesimo MD

## 2024-08-09 ENCOUNTER — Other Ambulatory Visit: Payer: Self-pay | Admitting: Cardiology

## 2024-09-02 ENCOUNTER — Other Ambulatory Visit: Payer: Self-pay | Admitting: Hematology

## 2024-09-02 DIAGNOSIS — C9110 Chronic lymphocytic leukemia of B-cell type not having achieved remission: Secondary | ICD-10-CM

## 2024-09-17 ENCOUNTER — Ambulatory Visit: Admitting: Neurology

## 2024-09-20 ENCOUNTER — Ambulatory Visit: Admitting: Neurology

## 2024-10-09 ENCOUNTER — Other Ambulatory Visit: Payer: Self-pay

## 2024-10-09 DIAGNOSIS — C9110 Chronic lymphocytic leukemia of B-cell type not having achieved remission: Secondary | ICD-10-CM

## 2024-10-09 MED ORDER — IBRUTINIB 420 MG PO TABS: 420.0000 mg | ORAL_TABLET | Freq: Every day | ORAL | 2 refills | Status: AC

## 2024-10-17 ENCOUNTER — Other Ambulatory Visit (HOSPITAL_COMMUNITY): Payer: Self-pay

## 2024-10-17 ENCOUNTER — Telehealth: Payer: Self-pay

## 2024-10-17 NOTE — Telephone Encounter (Signed)
 Oral Oncology Patient Advocate Encounter  Was successful in securing patient a $8000 grant from [facility] to provide copayment coverage for Imbruvica .  This will keep the out of pocket expense at $0.     [employer] ID: [id number]   The billing information is as follows and has been shared with [employer].    RxBin: [id number] PCN: [id number] Member ID: [id number] Group ID: [id number] Dates of Eligibility: 09/17/24 through 09/16/25  Fund:  CLL  Lucie Lamer, CPhT [place]  [facility] Oncology Pharmacy Patient Advocate Specialist II [facility] [place] Phone: [phone]  Fax: [phone] [email or web address]

## 2024-10-18 ENCOUNTER — Other Ambulatory Visit: Payer: Self-pay | Admitting: Endocrinology

## 2024-10-18 DIAGNOSIS — N6320 Unspecified lump in the left breast, unspecified quadrant: Secondary | ICD-10-CM

## 2024-10-25 ENCOUNTER — Ambulatory Visit
Admission: RE | Admit: 2024-10-25 | Discharge: 2024-10-25 | Disposition: A | Discharge status: Home / Self Care | Source: Ambulatory Visit | Attending: Endocrinology | Admitting: Endocrinology

## 2024-10-25 ENCOUNTER — Ambulatory Visit: Admission: RE | Admit: 2024-10-25 | Source: Ambulatory Visit

## 2024-10-25 DIAGNOSIS — N6320 Unspecified lump in the left breast, unspecified quadrant: Secondary | ICD-10-CM

## 2024-10-30 ENCOUNTER — Other Ambulatory Visit: Payer: Self-pay

## 2024-10-30 DIAGNOSIS — C9110 Chronic lymphocytic leukemia of B-cell type not having achieved remission: Secondary | ICD-10-CM

## 2024-10-31 ENCOUNTER — Inpatient Hospital Stay (HOSPITAL_BASED_OUTPATIENT_CLINIC_OR_DEPARTMENT_OTHER): Payer: Self-pay | Admitting: Hematology

## 2024-10-31 ENCOUNTER — Inpatient Hospital Stay: Payer: Self-pay | Attending: Hematology

## 2024-10-31 VITALS — BP 101/65 | HR 62 | Temp 97.7°F | Resp 18 | Wt 125.8 lb

## 2024-10-31 DIAGNOSIS — C9110 Chronic lymphocytic leukemia of B-cell type not having achieved remission: Secondary | ICD-10-CM | POA: Diagnosis not present

## 2024-10-31 LAB — CBC WITH DIFFERENTIAL (CANCER CENTER ONLY)
Abs Immature Granulocytes: 0.03 10*3/uL (ref 0.00–0.07)
Basophils Absolute: 0 10*3/uL (ref 0.0–0.1)
Basophils Relative: 0 %
Eosinophils Absolute: 0 10*3/uL (ref 0.0–0.5)
Eosinophils Relative: 0 %
HCT: 42 % (ref 36.0–46.0)
Hemoglobin: 14.8 g/dL (ref 12.0–15.0)
Immature Granulocytes: 0 %
Lymphocytes Relative: 44 %
Lymphs Abs: 5.1 10*3/uL — ABNORMAL HIGH (ref 0.7–4.0)
MCH: 32.7 pg (ref 26.0–34.0)
MCHC: 35.2 g/dL (ref 30.0–36.0)
MCV: 92.7 fL (ref 80.0–100.0)
Monocytes Absolute: 0.6 10*3/uL (ref 0.1–1.0)
Monocytes Relative: 6 %
Neutro Abs: 5.7 10*3/uL (ref 1.7–7.7)
Neutrophils Relative %: 50 %
Platelet Count: 238 10*3/uL (ref 150–400)
RBC: 4.53 MIL/uL (ref 3.87–5.11)
RDW: 12.2 % (ref 11.5–15.5)
WBC Count: 11.6 10*3/uL — ABNORMAL HIGH (ref 4.0–10.5)
nRBC: 0 % (ref 0.0–0.2)

## 2024-10-31 LAB — CMP (CANCER CENTER ONLY)
ALT: 15 U/L (ref 0–44)
AST: 22 U/L (ref 15–41)
Albumin: 4.5 g/dL (ref 3.5–5.0)
Alkaline Phosphatase: 65 U/L (ref 38–126)
Anion gap: 11 (ref 5–15)
BUN: 20 mg/dL (ref 8–23)
CO2: 28 mmol/L (ref 22–32)
Calcium: 9.9 mg/dL (ref 8.9–10.3)
Chloride: 104 mmol/L (ref 98–111)
Creatinine: 0.91 mg/dL (ref 0.44–1.00)
GFR, Estimated: >60 mL/min
Glucose, Bld: 108 mg/dL — ABNORMAL HIGH (ref 70–99)
Potassium: 3.6 mmol/L (ref 3.5–5.1)
Sodium: 144 mmol/L (ref 135–145)
Total Bilirubin: 0.9 mg/dL (ref 0.0–1.2)
Total Protein: 6.4 g/dL — ABNORMAL LOW (ref 6.5–8.1)

## 2024-11-01 ENCOUNTER — Telehealth: Payer: Self-pay | Admitting: Hematology

## 2024-11-01 NOTE — Telephone Encounter (Signed)
 Scheduled patient for next appointment. Called and spoke with the patients husband, he is aware.

## 2024-11-06 NOTE — Progress Notes (Incomplete)
 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 female who is here for continued evaluation and management of CLL. Patient was following up with Dr. Amadeo.   Patient was diagnosed with CLL with lymphocytosis and adenopathy in 2018. Her secondary diagnosis was Fibroadenoma of the left breast diagnosed in 2018, which she had surgical resection and no evidence of relapse after the resection.   Patient's current treatment for CLL is Ibrutinib  420 mg, which was started in December 2020. Her treatment was temporarily withheld from April 2023 to July 2023 because she was diagnosed with hip arthritis and patient thought the joint pain was due to Ibritinib.    INTERVAL HISTORY: Alice Pollard is a wonderful 74 y.o. female who is here for continued evaluation and management of CLL.  Patient was last seen by me on 07/18/2024 and did not have any concerns.   Today, she says    MEDICAL HISTORY:  Past Medical History:  Diagnosis Date   ADD (attention deficit disorder)    CLL (chronic lymphocytic leukemia) (HCC)    Dyslipidemia    Endometriosis    Hematuria    h/o asymptomatic   HTN (hypertension)    PONV (postoperative nausea and vomiting)     SURGICAL HISTORY: Past Surgical History:  Procedure Laterality Date   LAPAROSCOPY     endometriosis   RADIOACTIVE SEED GUIDED EXCISIONAL BREAST BIOPSY Left 09/13/2017   Procedure: RADIOACTIVE SEED GUIDED EXCISIONAL BREAST BIOPSY;  Surgeon: Ebbie Cough, MD;  Location: [facility];  Service: General;  Laterality: Left;   TOTAL HIP ARTHROPLASTY Left     SOCIAL HISTORY: Social History   Socioeconomic History    Marital status: Married    Spouse name: Not on file   Number of children: 3   Years of education: Not on file   Highest education level: Not on file  Occupational History   Occupation: soil scientist  Tobacco Use   Smoking status: Never   Smokeless tobacco: Never  Vaping Use   Vaping status: Never Used  Substance and Sexual Activity   Alcohol use: Not Currently   Drug use: No   Sexual activity: Not Currently    Partners: Male    Birth control/protection: Post-menopausal    Comment: vasectomy  Other Topics Concern   Not on file  Social History Narrative   Not on file   Social Drivers of Health   Tobacco Use: Low Risk (12/21/2023)   Patient History    Smoking Tobacco Use: Never    Smokeless Tobacco Use: Never    Passive Exposure: Not on file  Financial Resource Strain: Not on file  Food Insecurity: Not on file  Transportation Needs: Not on file  Physical Activity: Not on file  Stress: Not on file  Social Connections: Not on file  Intimate Partner Violence: Not on file  Depression (PHQ2-9): Low Risk (10/31/2024)   Depression (PHQ2-9)    PHQ-2 Score: 0  Alcohol Screen: Not on file  Housing: Not on file  Utilities: Not on file  Health Literacy: Not on file    FAMILY HISTORY: Family History  Problem Relation Age of Onset   Heart failure Mother    Diabetes Father  Other Brother        bilateral bladder   Colon cancer Neg Hx     ALLERGIES:  is allergic to codeine, latex, and other.  MEDICATIONS:  Current Outpatient Medications  Medication Sig Dispense Refill   CALCIUM  PO Take 600 mg by mouth daily. PATIENT NOT SURE OF DOSAGE     ezetimibe (ZETIA) 10 MG tablet Take 10 mg by mouth daily.     FLUoxetine  (PROZAC ) 20 MG tablet TAKE 1 TABLET(20 MG) BY MOUTH DAILY 90 tablet 2   hydrochlorothiazide  (MICROZIDE ) 12.5 MG capsule TAKE 1 CAPSULE(12.5 MG) BY MOUTH DAILY 90 capsule 1   ibrutinib  (IMBRUVICA ) 420 MG tablet Take 1 tablet (420 mg total) by mouth daily.  Take with a glass of water. 30 tablet 2   levothyroxine (SYNTHROID) 75 MCG tablet Take 75 mcg by mouth daily before breakfast.     Magnesium Gluconate (MAGNESIUM 27 PO) Take by mouth.     metoprolol  succinate (TOPROL -XL) 25 MG 24 hr tablet TAKE 1 TABLET(25 MG) BY MOUTH DAILY 90 tablet 3   Multiple Vitamin (MULTIVITAMIN) tablet Take 1 tablet by mouth daily.     potassium chloride  SA (KLOR-CON  M) 20 MEQ tablet Take 1 tablet (20 mEq total) by mouth daily. 90 tablet 0   rosuvastatin  (CRESTOR ) 5 MG tablet Take 5 mg by mouth 2 (two) times a week.     telmisartan  (MICARDIS ) 80 MG tablet TAKE 1 TABLET BY MOUTH DAILY 90 tablet 3   No current facility-administered medications for this visit.    REVIEW OF SYSTEMS:    10 Point review of Systems was done is negative except as noted above.   PHYSICAL EXAMINATION: ECOG PERFORMANCE STATUS: 1 - Symptomatic but completely ambulatory BP 101/65   Pulse 62   Temp 97.7 F (36.5 C)   Resp 18   Wt 125 lb 12.8 oz (57.1 kg)   LMP 08/05/2007   SpO2 99%   BMI 23.01 kg/m  GENERAL:alert, in no acute distress and comfortable SKIN: no acute rashes, no significant lesions, (+) areas of scarring related to removal of skin lesions EYES: conjunctiva are pink and non-injected, sclera anicteric OROPHARYNX: MMM, no exudates, no oropharyngeal erythema or ulceration NECK: supple, no JVD LYMPH:  no palpable lymphadenopathy in the cervical, axillary or inguinal regions LUNGS: clear to auscultation b/l with normal respiratory effort HEART: regular rate & rhythm ABDOMEN:  normoactive bowel sounds , non tender, not distended. Extremity: no pedal edema PSYCH: alert & oriented x 3 with fluent speech NEURO: no focal motor/sensory deficits   LABORATORY DATA:  I have reviewed the data as listed     Latest Ref Rng & Units 10/31/2024    9:26 AM 07/18/2024    8:38 AM 04/25/2024    9:46 AM  CBC EXTENDED  WBC 4.0 - 10.5 K/uL 11.6  7.7  10.8   RBC 3.87 - 5.11 MIL/uL 4.53   4.53  4.47   Hemoglobin 12.0 - 15.0 g/dL 85.1  85.5  85.9   HCT 36.0 - 46.0 % 42.0  42.0  41.5   Platelets 150 - 400 K/uL 238  141  240   NEUT# 1.7 - 7.7 K/uL 5.7  4.1  4.6   Lymph# 0.7 - 4.0 K/uL 5.1  2.5  5.5       Latest Ref Rng & Units 10/31/2024    9:26 AM 07/18/2024    8:38 AM 04/25/2024    9:46 AM  CMP  Glucose 70 - 99 mg/dL 891  86  83   BUN 8 - 23 mg/dL 20  18  13    Creatinine 0.44 - 1.00 mg/dL 9.08  9.15  9.14   Sodium 135 - 145 mmol/L 144  142  142   Potassium 3.5 - 5.1 mmol/L 3.6  3.4  3.7   Chloride 98 - 111 mmol/L 104  104  105   CO2 22 - 32 mmol/L 28  33  32   Calcium  8.9 - 10.3 mg/dL 9.9  9.8  9.5   Total Protein 6.5 - 8.1 g/dL 6.4  6.5  6.2   Total Bilirubin 0.0 - 1.2 mg/dL 0.9  0.9  0.8   Alkaline Phos 38 - 126 U/L 65  66  53   AST 15 - 41 U/L 22  20  17    ALT 0 - 44 U/L 15  11  9     RADIOGRAPHIC STUDIES: I have personally reviewed the radiological images as listed and agreed with the findings in the report. MM 3D DIAGNOSTIC MAMMOGRAM BILATERAL BREAST Result Date: 10/25/2024 CLINICAL DATA:  74 year old female here for annual exam. Patient's doctor noted a 1 x 1-1/2 cm nodule in the left breast on recent exam. Quadrant not indicated. Patient has a history of remote left excisional biopsy. EXAM: DIGITAL DIAGNOSTIC BILATERAL MAMMOGRAM WITH TOMOSYNTHESIS AND CAD TECHNIQUE: Bilateral digital diagnostic mammography and breast tomosynthesis was performed. The images were evaluated with computer-aided detection. COMPARISON:  Previous exam(s). ACR Breast Density Category c: The breasts are heterogeneously dense, which may obscure small masses. FINDINGS: There is a stable oval 1.7 by 1 cm calcified benign oil cyst in the left breast at approximately 12-1 o'clock middle depth. There are scattered benign diffuse calcifications in the right breast and to a lesser extent the left breast. No suspicious grouped microcalcifications, architectural distortion or suspicious masses seen in  either breast. There has been no significant interval change compared to remote prior exams. IMPRESSION: No mammographic evidence of malignancy in either breast. RECOMMENDATION: Routine annual screening mammogram in 1 year. I have discussed the findings and recommendations with the patient. If applicable, a reminder letter will be sent to the patient regarding the next appointment. BI-RADS CATEGORY  2: Benign. Electronically Signed   By: Rosina Gelineau M.D.   On: 10/25/2024 14:59     ASSESSMENT & PLAN:  74 year old woman with:   1.  CLL diagnosed in 2018.  She presented with lymphocytosis and adenopathy and currently in remission. Current treatment is Ibrutinib .     2.  Autoimmune considerations: She has not experienced any autoimmune thrombocytopenia or acute hemolytic anemia.  Will continue to monitor.   3.  Hypokalemia.   4.  Arthritis of the hip: Continues to follow with orthopedics regarding this issue.  She is status post injection of the left hip with improvement in her pain. Planned for left THA  PLAN: - Discussed lab results on 10/31/2024 in detail with patient: CBC showed WBC of 11.6K  increased from 7.7K, Hemoglobin of 14.8, and PLTs of 238K increased from 141K. CMP with Glucose 108 and Total Protein 6.4  FOLLOW-UP: RTC with Dr Onesimo with labs in 3 months  .The total time spent in the appointment was 21 minutes* .  All of the patient's questions were answered with apparent satisfaction. The patient knows to call the clinic with any problems, questions or concerns.   Emaline Onesimo MD MS AAHIVMS [facility] Hematology/Oncology Physician [facility]  .*Total Encounter Time as defined by the Centers  for Medicare and Medicaid Services includes, in addition to the face-to-face time of a patient visit (documented in the note above) non-face-to-face time: obtaining and reviewing outside history, ordering and reviewing medications, tests or procedures, care coordination  (communications with other health care professionals or caregivers) and documentation in the medical record.   I, Damien Blanks, acting as a neurosurgeon for Emaline Saran, MD.,have documented all relevant documentation on the behalf of Emaline Saran, MD,as directed by  Emaline Saran, MD while in the presence of Emaline Saran, MD.  I have reviewed the above documentation for accuracy and completeness, and I agree with the above. Emaline Candida Saran MD "

## 2024-12-13 ENCOUNTER — Ambulatory Visit: Admitting: Diagnostic Neuroimaging

## 2025-01-30 ENCOUNTER — Inpatient Hospital Stay: Attending: Hematology

## 2025-01-30 ENCOUNTER — Inpatient Hospital Stay: Admitting: Hematology
